# Patient Record
Sex: Female | Born: 1935
Health system: Southern US, Community
[De-identification: ages and names within clinical notes are randomized; demographics above are authoritative.]

## PROBLEM LIST (undated history)

## (undated) ENCOUNTER — Emergency Department (HOSPITAL_COMMUNITY): Payer: PRIVATE HEALTH INSURANCE

## (undated) DIAGNOSIS — N189 Chronic kidney disease, unspecified: Secondary | ICD-10-CM

## (undated) DIAGNOSIS — M1990 Unspecified osteoarthritis, unspecified site: Secondary | ICD-10-CM

## (undated) DIAGNOSIS — C801 Malignant (primary) neoplasm, unspecified: Secondary | ICD-10-CM

## (undated) DIAGNOSIS — E785 Hyperlipidemia, unspecified: Secondary | ICD-10-CM

## (undated) DIAGNOSIS — N200 Calculus of kidney: Secondary | ICD-10-CM

## (undated) DIAGNOSIS — K2970 Gastritis, unspecified, without bleeding: Secondary | ICD-10-CM

## (undated) DIAGNOSIS — I10 Essential (primary) hypertension: Secondary | ICD-10-CM

## (undated) DIAGNOSIS — M797 Fibromyalgia: Secondary | ICD-10-CM

## (undated) DIAGNOSIS — E039 Hypothyroidism, unspecified: Secondary | ICD-10-CM

## (undated) DIAGNOSIS — E119 Type 2 diabetes mellitus without complications: Secondary | ICD-10-CM

## (undated) DIAGNOSIS — K5790 Diverticulosis of intestine, part unspecified, without perforation or abscess without bleeding: Secondary | ICD-10-CM

## (undated) DIAGNOSIS — Z87442 Personal history of urinary calculi: Secondary | ICD-10-CM

## (undated) DIAGNOSIS — E063 Autoimmune thyroiditis: Secondary | ICD-10-CM

## (undated) DIAGNOSIS — B9681 Helicobacter pylori [H. pylori] as the cause of diseases classified elsewhere: Secondary | ICD-10-CM

## (undated) DIAGNOSIS — K219 Gastro-esophageal reflux disease without esophagitis: Secondary | ICD-10-CM

## (undated) DIAGNOSIS — R10813 Right lower quadrant abdominal tenderness: Secondary | ICD-10-CM

## (undated) HISTORY — DX: Hypothyroidism, unspecified: E03.9

## (undated) HISTORY — DX: Malignant (primary) neoplasm, unspecified: C80.1

## (undated) HISTORY — DX: Helicobacter pylori (H. pylori) as the cause of diseases classified elsewhere: B96.81

## (undated) HISTORY — DX: Calculus of kidney: N20.0

## (undated) HISTORY — DX: Right lower quadrant abdominal tenderness: R10.813

## (undated) HISTORY — PX: ABDOMINAL HYSTERECTOMY: SHX81

## (undated) HISTORY — DX: Helicobacter pylori (H. pylori) as the cause of diseases classified elsewhere: K29.70

## (undated) HISTORY — DX: Gastro-esophageal reflux disease without esophagitis: K21.9

## (undated) HISTORY — PX: TOTAL HIP ARTHROPLASTY: SHX124

## (undated) HISTORY — PX: ROBOTIC ASSITED PARTIAL NEPHRECTOMY: SHX6087

## (undated) HISTORY — PX: BLADDER SURGERY: SHX569

## (undated) HISTORY — DX: Essential (primary) hypertension: I10

## (undated) HISTORY — PX: CHOLECYSTECTOMY: SHX55

---

## 1992-07-22 HISTORY — PX: ESOPHAGOGASTRODUODENOSCOPY: SHX1529

## 2001-05-30 ENCOUNTER — Ambulatory Visit (HOSPITAL_COMMUNITY): Admission: RE | Admit: 2001-05-30 | Discharge: 2001-05-30 | Payer: Self-pay | Admitting: General Surgery

## 2001-05-30 ENCOUNTER — Encounter: Payer: Self-pay | Admitting: General Surgery

## 2001-06-26 ENCOUNTER — Ambulatory Visit (HOSPITAL_COMMUNITY): Admission: RE | Admit: 2001-06-26 | Discharge: 2001-06-26 | Payer: Self-pay | Admitting: Pulmonary Disease

## 2001-09-23 ENCOUNTER — Other Ambulatory Visit: Admission: RE | Admit: 2001-09-23 | Discharge: 2001-09-23 | Payer: Self-pay | Admitting: Obstetrics and Gynecology

## 2002-06-08 ENCOUNTER — Encounter: Payer: Self-pay | Admitting: General Surgery

## 2002-06-08 ENCOUNTER — Ambulatory Visit (HOSPITAL_COMMUNITY): Admission: RE | Admit: 2002-06-08 | Discharge: 2002-06-08 | Payer: Self-pay | Admitting: General Surgery

## 2003-03-22 ENCOUNTER — Encounter: Payer: Self-pay | Admitting: Internal Medicine

## 2003-03-22 ENCOUNTER — Ambulatory Visit (HOSPITAL_COMMUNITY): Admission: RE | Admit: 2003-03-22 | Discharge: 2003-03-22 | Payer: Self-pay | Admitting: Internal Medicine

## 2003-05-22 ENCOUNTER — Ambulatory Visit (HOSPITAL_COMMUNITY): Admission: RE | Admit: 2003-05-22 | Discharge: 2003-05-22 | Payer: Self-pay | Admitting: Internal Medicine

## 2003-05-22 HISTORY — PX: COLONOSCOPY: SHX174

## 2003-05-22 HISTORY — PX: ESOPHAGOGASTRODUODENOSCOPY: SHX1529

## 2003-06-11 ENCOUNTER — Encounter: Payer: Self-pay | Admitting: General Surgery

## 2003-06-11 ENCOUNTER — Ambulatory Visit (HOSPITAL_COMMUNITY): Admission: RE | Admit: 2003-06-11 | Discharge: 2003-06-11 | Payer: Self-pay | Admitting: General Surgery

## 2003-11-01 ENCOUNTER — Encounter: Payer: Self-pay | Admitting: Orthopedic Surgery

## 2004-06-03 ENCOUNTER — Ambulatory Visit (HOSPITAL_COMMUNITY): Admission: RE | Admit: 2004-06-03 | Discharge: 2004-06-03 | Payer: Self-pay | Admitting: Pulmonary Disease

## 2004-06-12 ENCOUNTER — Ambulatory Visit (HOSPITAL_COMMUNITY): Admission: RE | Admit: 2004-06-12 | Discharge: 2004-06-12 | Payer: Self-pay | Admitting: General Surgery

## 2005-03-19 ENCOUNTER — Ambulatory Visit: Payer: Self-pay | Admitting: Orthopedic Surgery

## 2005-04-07 ENCOUNTER — Ambulatory Visit: Payer: Self-pay | Admitting: Orthopedic Surgery

## 2005-04-27 ENCOUNTER — Ambulatory Visit: Payer: Self-pay | Admitting: Orthopedic Surgery

## 2005-06-15 ENCOUNTER — Ambulatory Visit (HOSPITAL_COMMUNITY): Admission: RE | Admit: 2005-06-15 | Discharge: 2005-06-15 | Payer: Self-pay | Admitting: Pulmonary Disease

## 2005-11-04 ENCOUNTER — Emergency Department (HOSPITAL_COMMUNITY): Admission: EM | Admit: 2005-11-04 | Discharge: 2005-11-04 | Payer: Self-pay | Admitting: Emergency Medicine

## 2006-02-05 ENCOUNTER — Ambulatory Visit (HOSPITAL_COMMUNITY): Admission: RE | Admit: 2006-02-05 | Discharge: 2006-02-05 | Payer: Self-pay | Admitting: Rheumatology

## 2006-02-23 ENCOUNTER — Encounter: Admission: RE | Admit: 2006-02-23 | Discharge: 2006-02-23 | Payer: Self-pay | Admitting: Rheumatology

## 2006-04-15 ENCOUNTER — Ambulatory Visit: Payer: Self-pay | Admitting: Orthopedic Surgery

## 2006-05-20 ENCOUNTER — Ambulatory Visit: Payer: Self-pay | Admitting: Orthopedic Surgery

## 2006-06-17 ENCOUNTER — Ambulatory Visit (HOSPITAL_COMMUNITY): Admission: RE | Admit: 2006-06-17 | Discharge: 2006-06-17 | Payer: Self-pay | Admitting: Pediatrics

## 2006-06-30 ENCOUNTER — Encounter: Admission: RE | Admit: 2006-06-30 | Discharge: 2006-06-30 | Payer: Self-pay | Admitting: Orthopedic Surgery

## 2006-07-20 ENCOUNTER — Encounter: Admission: RE | Admit: 2006-07-20 | Discharge: 2006-07-20 | Payer: Self-pay | Admitting: Orthopedic Surgery

## 2006-08-10 ENCOUNTER — Encounter: Admission: RE | Admit: 2006-08-10 | Discharge: 2006-08-10 | Payer: Self-pay | Admitting: Orthopedic Surgery

## 2006-09-02 ENCOUNTER — Ambulatory Visit: Payer: Self-pay | Admitting: Orthopedic Surgery

## 2007-03-21 ENCOUNTER — Ambulatory Visit: Payer: Self-pay | Admitting: Orthopedic Surgery

## 2007-04-07 ENCOUNTER — Ambulatory Visit: Payer: Self-pay | Admitting: Orthopedic Surgery

## 2007-05-24 ENCOUNTER — Ambulatory Visit: Payer: Self-pay | Admitting: Orthopedic Surgery

## 2007-06-20 ENCOUNTER — Ambulatory Visit (HOSPITAL_COMMUNITY): Admission: RE | Admit: 2007-06-20 | Discharge: 2007-06-20 | Payer: Self-pay | Admitting: General Surgery

## 2007-07-06 ENCOUNTER — Ambulatory Visit (HOSPITAL_COMMUNITY): Admission: RE | Admit: 2007-07-06 | Discharge: 2007-07-06 | Payer: Self-pay | Admitting: General Surgery

## 2007-07-20 ENCOUNTER — Ambulatory Visit: Payer: Self-pay | Admitting: Orthopedic Surgery

## 2007-11-14 ENCOUNTER — Other Ambulatory Visit: Admission: RE | Admit: 2007-11-14 | Discharge: 2007-11-14 | Payer: Self-pay | Admitting: Obstetrics and Gynecology

## 2007-12-08 ENCOUNTER — Ambulatory Visit: Payer: Self-pay | Admitting: Internal Medicine

## 2008-01-04 ENCOUNTER — Ambulatory Visit: Payer: Self-pay | Admitting: Internal Medicine

## 2008-01-16 ENCOUNTER — Ambulatory Visit: Payer: Self-pay | Admitting: Internal Medicine

## 2008-03-13 ENCOUNTER — Ambulatory Visit: Payer: Self-pay | Admitting: Internal Medicine

## 2008-06-21 ENCOUNTER — Ambulatory Visit (HOSPITAL_COMMUNITY): Admission: RE | Admit: 2008-06-21 | Discharge: 2008-06-21 | Payer: Self-pay | Admitting: General Surgery

## 2008-09-25 ENCOUNTER — Inpatient Hospital Stay: Admission: RE | Admit: 2008-09-25 | Discharge: 2008-10-03 | Payer: Self-pay | Admitting: Pulmonary Disease

## 2009-02-11 ENCOUNTER — Ambulatory Visit: Payer: Self-pay | Admitting: Orthopedic Surgery

## 2009-02-11 DIAGNOSIS — M797 Fibromyalgia: Secondary | ICD-10-CM | POA: Insufficient documentation

## 2009-02-11 DIAGNOSIS — M7150 Other bursitis, not elsewhere classified, unspecified site: Secondary | ICD-10-CM

## 2009-02-11 DIAGNOSIS — M79609 Pain in unspecified limb: Secondary | ICD-10-CM | POA: Insufficient documentation

## 2009-02-11 HISTORY — DX: Other bursitis, not elsewhere classified, unspecified site: M71.50

## 2009-02-12 ENCOUNTER — Encounter: Payer: Self-pay | Admitting: Gastroenterology

## 2009-02-20 ENCOUNTER — Encounter: Payer: Self-pay | Admitting: Orthopedic Surgery

## 2009-04-22 DIAGNOSIS — E049 Nontoxic goiter, unspecified: Secondary | ICD-10-CM | POA: Insufficient documentation

## 2009-04-22 DIAGNOSIS — I10 Essential (primary) hypertension: Secondary | ICD-10-CM | POA: Insufficient documentation

## 2009-04-22 DIAGNOSIS — IMO0001 Reserved for inherently not codable concepts without codable children: Secondary | ICD-10-CM

## 2009-04-22 DIAGNOSIS — K5730 Diverticulosis of large intestine without perforation or abscess without bleeding: Secondary | ICD-10-CM

## 2009-04-22 DIAGNOSIS — K5900 Constipation, unspecified: Secondary | ICD-10-CM

## 2009-04-22 DIAGNOSIS — M25559 Pain in unspecified hip: Secondary | ICD-10-CM

## 2009-04-22 DIAGNOSIS — Z8719 Personal history of other diseases of the digestive system: Secondary | ICD-10-CM

## 2009-04-22 DIAGNOSIS — E039 Hypothyroidism, unspecified: Secondary | ICD-10-CM

## 2009-04-22 DIAGNOSIS — R1013 Epigastric pain: Secondary | ICD-10-CM

## 2009-04-22 DIAGNOSIS — K219 Gastro-esophageal reflux disease without esophagitis: Secondary | ICD-10-CM

## 2009-04-22 HISTORY — DX: Reserved for inherently not codable concepts without codable children: IMO0001

## 2009-04-22 HISTORY — DX: Epigastric pain: R10.13

## 2009-04-22 HISTORY — DX: Pain in unspecified hip: M25.559

## 2009-04-22 HISTORY — DX: Constipation, unspecified: K59.00

## 2009-04-22 HISTORY — DX: Nontoxic goiter, unspecified: E04.9

## 2009-04-22 HISTORY — DX: Diverticulosis of large intestine without perforation or abscess without bleeding: K57.30

## 2009-04-22 HISTORY — DX: Hypothyroidism, unspecified: E03.9

## 2009-04-22 HISTORY — DX: Personal history of other diseases of the digestive system: Z87.19

## 2009-04-24 ENCOUNTER — Ambulatory Visit: Payer: Self-pay | Admitting: Gastroenterology

## 2009-06-24 ENCOUNTER — Ambulatory Visit (HOSPITAL_COMMUNITY): Admission: RE | Admit: 2009-06-24 | Discharge: 2009-06-24 | Payer: Self-pay | Admitting: General Surgery

## 2009-09-03 ENCOUNTER — Ambulatory Visit: Payer: Self-pay | Admitting: Orthopedic Surgery

## 2009-11-22 ENCOUNTER — Emergency Department (HOSPITAL_COMMUNITY): Admission: EM | Admit: 2009-11-22 | Discharge: 2009-11-22 | Payer: Self-pay | Admitting: Emergency Medicine

## 2010-06-02 ENCOUNTER — Ambulatory Visit (HOSPITAL_COMMUNITY): Admission: RE | Admit: 2010-06-02 | Discharge: 2010-06-02 | Payer: Self-pay | Admitting: Pulmonary Disease

## 2010-06-25 ENCOUNTER — Ambulatory Visit: Payer: Self-pay | Admitting: Orthopedic Surgery

## 2010-06-25 DIAGNOSIS — M6530 Trigger finger, unspecified finger: Secondary | ICD-10-CM | POA: Insufficient documentation

## 2010-06-25 HISTORY — DX: Trigger finger, unspecified finger: M65.30

## 2010-06-26 ENCOUNTER — Ambulatory Visit (HOSPITAL_COMMUNITY): Admission: RE | Admit: 2010-06-26 | Discharge: 2010-06-26 | Payer: Self-pay | Admitting: General Surgery

## 2010-06-26 ENCOUNTER — Telehealth: Payer: Self-pay | Admitting: Orthopedic Surgery

## 2010-06-27 ENCOUNTER — Telehealth: Payer: Self-pay | Admitting: Orthopedic Surgery

## 2010-08-23 ENCOUNTER — Emergency Department (HOSPITAL_COMMUNITY)
Admission: EM | Admit: 2010-08-23 | Discharge: 2010-08-23 | Payer: Self-pay | Source: Home / Self Care | Admitting: Emergency Medicine

## 2010-12-09 NOTE — Assessment & Plan Note (Signed)
Summary: LT HAND/FINGER PAIN/EVERCARE/CAF   Visit Type:  Follow-up Primary Provider:  Juanetta Gosling  CC:  trigger finger.  History of Present Illness: I saw Brenda Erickson for a followup visit.  Brenda Erickson is a 77 years old woman with the complaint of:  trigeer finger  left 3rd and 4th finger and right 3rd finger.  The patient has RIGHT long finger LEFT long finger and LEFT ring finger trigger phenomenon and we injected all 3 areas With the following technique  Verbal consent was obtained: The finger was prepped with ethyl chloride and injected with 1:1 injection of .25% sensorcaine, 1cc  and 40 mg of depomedrol, 1cc. There were no complications.   Allergies: 1)  ! Codeine   Impression & Recommendations:  Problem # 1:  TRIGGER FINGER DEFORMITY (ICD-727.03) Assessment Deteriorated  3 injection for the triggering  Orders: Injection, Tendon / Ligament ([address]109)  Patient Instructions: 1)  You have received an injection of cortisone Erickson. You may experience increased pain at the injection site. Apply ice pack to the area for 20 minutes every 2 hours and take 2 xtra strength tylenol every 8 hours. This increased pain will usually resolve in 24 hours. The injection will take effect in 3-10 days.  2)  Please schedule a follow-up appointment as needed.

## 2010-12-09 NOTE — Progress Notes (Signed)
Summary: patient requests Rx for pain  Phone Note Call from Patient   Caller: Patient Summary of Call: Patient called to request Hydrocodone, as said that her hand is really hurting and nothing is helping.  I reviewed instructions from visit yesterday re: icing, Tylenol.  Said even if "1 time" Rx and pharmacy is [facility] in [place].  Patient ph # [phone] Initial call taken by: Cammie Sickle,  June 26, 2010 9:36 AM  Follow-up for Phone Call        Tylenol with codeine #3  one tablet every 4 hours for pain dispensed #30   Follow-up by: Fuller Canada MD,  June 26, 2010 9:44 AM  Additional Follow-up for Phone Call Additional follow up Details #1::        called in Additional Follow-up by: Ether Griffins,  June 26, 2010 11:25 AM    New/Updated Medications: TYLENOL WITH CODEINE #3 300-30 MG TABS (ACETAMINOPHEN-CODEINE) 1 by mouth q 4 hrs as needed pain Prescriptions: TYLENOL WITH CODEINE #3 300-30 MG TABS (ACETAMINOPHEN-CODEINE) 1 by mouth q 4 hrs as needed pain  #30 x 0   Entered by:   Ether Griffins   Authorized by:   Fuller Canada MD   Signed by:   Ether Griffins on 06/26/2010   Method used:   Historical   RxID:   [id number]

## 2010-12-09 NOTE — Progress Notes (Signed)
Summary: allergic to codeine  Phone Note Other Incoming Call back at pharmacy   Summary of Call: advised that patient is allergic to codeine, any other suggestions for pain med Initial call taken by: Ether Griffins,  June 27, 2010 9:25 AM  Follow-up for Phone Call        she s going to have to take tylenol for pain  Follow-up by: Fuller Canada MD,  June 27, 2010 11:17 AM  Additional Follow-up for Phone Call Additional follow up Details #1::        ok advised pt Additional Follow-up by: Ether Griffins,  June 27, 2010 11:31 AM

## 2011-01-21 LAB — URINALYSIS, ROUTINE W REFLEX MICROSCOPIC
Bilirubin Urine: NEGATIVE
Urobilinogen, UA: 0.2 mg/dL (ref 0.0–1.0)
pH: 7 (ref 5.0–8.0)

## 2011-01-21 LAB — DIFFERENTIAL
Basophils Relative: 0 % (ref 0–1)
Eosinophils Absolute: 0.2 10*3/uL (ref 0.0–0.7)
Eosinophils Relative: 2 % (ref 0–5)
Lymphs Abs: 2.8 10*3/uL (ref 0.7–4.0)
Neutro Abs: 5.8 10*3/uL (ref 1.7–7.7)
Neutrophils Relative %: 62 % (ref 43–77)

## 2011-01-21 LAB — LIPASE, BLOOD: Lipase: 12 U/L (ref 11–59)

## 2011-01-21 LAB — COMPREHENSIVE METABOLIC PANEL
ALT: 51 U/L — ABNORMAL HIGH (ref 0–35)
Albumin: 4.3 g/dL (ref 3.5–5.2)
Creatinine, Ser: 0.84 mg/dL (ref 0.4–1.2)
GFR calc non Af Amer: >60 mL/min (ref >60)
Potassium: 3.6 mEq/L (ref 3.5–5.1)
Sodium: 135 mEq/L (ref 135–145)
Total Bilirubin: 0.6 mg/dL (ref 0.3–1.2)

## 2011-01-21 LAB — URINE MICROSCOPIC-ADD ON

## 2011-01-21 LAB — CBC
Hemoglobin: 15.8 g/dL — ABNORMAL HIGH (ref 12.0–15.0)
MCH: 30.5 pg (ref 26.0–34.0)
MCHC: 33 g/dL (ref 30.0–36.0)
RDW: 13.1 % (ref 11.5–15.5)

## 2011-01-25 LAB — URINE MICROSCOPIC-ADD ON

## 2011-01-25 LAB — URINE CULTURE
Colony Count: NO GROWTH
Culture: NO GROWTH

## 2011-01-25 LAB — URINALYSIS, ROUTINE W REFLEX MICROSCOPIC
Bilirubin Urine: NEGATIVE
Protein, ur: NEGATIVE mg/dL

## 2011-02-18 ENCOUNTER — Ambulatory Visit (INDEPENDENT_AMBULATORY_CARE_PROVIDER_SITE_OTHER): Payer: Medicare Other | Admitting: Orthopedic Surgery

## 2011-02-18 ENCOUNTER — Encounter: Payer: Self-pay | Admitting: Family Medicine

## 2011-02-18 DIAGNOSIS — M6530 Trigger finger, unspecified finger: Secondary | ICD-10-CM

## 2011-02-18 MED ORDER — METHYLPREDNISOLONE ACETATE 40 MG/ML IJ SUSP: 40.0000 mg | Freq: Once | INTRAMUSCULAR | Status: DC

## 2011-02-18 NOTE — Patient Instructions (Signed)
You have received a steroid shot. 15% of patients experience increased pain at the injection site with in the next 24 hours. This is best treated with ice and tylenol extra strength 2 tabs every 8 hours. If you are still having pain please call the office.    

## 2011-02-18 NOTE — Progress Notes (Signed)
Chief complaint triggering, LEFT hand, long and ring finger and RIGHT hand long finger.  77 year old female with history of triggering. Previously treated with injection did well. Complains of painful locking and catching for approximately 2-3 months.  Verbal consent was obtained to inject the LEFT long finger LEFT RIGHT finger and RIGHT long finger.  Trigger finger injection one-to-one mixture of lidocaine and Depo-Medrol. After sterile prep and spraying with ethyl chloride A1 pulley site was injected x3.

## 2011-03-24 NOTE — Assessment & Plan Note (Signed)
NAME:  Brenda Erickson, Brenda Erickson             CHART#:  [id number]   DATE:  03/13/2008                       DOB:  Apr 04, 1933   OFFICE FOLLOWUP.   HISTORY:  I last saw this nice lady on January 04, 2008.  She was  having some post-prandial epigastric pain, some reflux and constipation.  She had been started on Aciphex 20 mg early daily which has subsequently  been switched over to Omeprazole 20 mg daily for 3rd party payment  reasons.  She had been taking MiraLax 70 gm early at bedtime for  constipation.  Her last colonoscopy was in 2004.  She was found to have  left sided diverticula. H. Pylori was treated in the past.  Prior EGD  demonstrated reflux esophagitis.  Three myelin hemoccult since her last  visit came back negative.  She says since she switched to omeprazole and  continued using MiraLax her GI symptoms have RESOLVED.  She is having  normal bowel movements daily.  Her reflux symptoms have been pretty well  abolished.  She has not had clinically noted GI bleeding.  No early  satiety, odynophagia or dysphagia.  She tells me her right hip is bad.  She has seen doctors over at __________ making plans to get her hip  replaced in the near future.   CURRENT MEDICATIONS:  See updated list.   ALLERGIES:  CODEINE.   PHYSICAL EXAMINATION:  GENERAL: Today she appears at her baseline.  VITAL SIGNS:  Weight 145, height 5 feet 4 inches, temperature 97, blood  pressure 138/88, pulse 72.  SKIN:  Warm and dry.  CHEST:  Lungs are clear to auscultation.  CARDIAC:  Regular rate and rhythm without murmur, gallop or rub.  ABDOMEN:  Nondistended, positive bowel sounds, soft, nontender without  appreciable mass or megaly.   ASSESSMENT:  1. Gastroesophageal reflux disease symptoms.  2. History of reflux esophagitis well controlled on omeprazole.  3. Constipation.  4. History of diverticulosis, well controlled on MiraLax.   RECOMMENDATIONS:  1. Would continue this regimen.  2. I certainly gave the  okay to proceed on with getting her right hip      replaced by GI standpoint.  3. Unless something comes up, plan to see this nice lady back in 1      year and p.r.n.   ADDENDUM:  Screening colonoscopy due 2014.       Jonathon Bellows, M.D.  Electronically Signed     RMR/MEDQ  D:  03/13/2008  T:  03/13/2008  Job:  [id number]   cc:   Venora Maples

## 2011-03-24 NOTE — Assessment & Plan Note (Signed)
NAME:  ZENOLA, DEZARN             CHART#:  [id number]   DATE:  12/08/2007                       DOB:  June 07, 1933   PRIMARY CARE PHYSICIAN:  Dr. Juanetta Gosling.   CHIEF COMPLAINT:  Epigastric postprandial pain.   HISTORY OF PRESENT ILLNESS:  Ms. Senseney is a 77 year old female who  was referred through the courtesy of Dr. Rayna Sexton office for abdominal  pain. We have actually seen her previously back in 2004. She has history  of gastroesophageal reflux disease with minimal erosive esophagitis on  EGD. Back on May 22, 2003, she also had a colonoscopy at the same time  and was found to have left sided diverticula. She has been having post-  prandial epigastric pain, which she describes as a burning sensation,  usually occurs within minutes after eating. It can last several hours.  She describes the pain as 8 out of 10 but she rates the pain as 8 out of  10 at worst. She complains of symptoms several days a week. Denies any  heartburn. She has tried Prilosec in the past, as well as Nexium in the  past, which has not helped. She has been on Omeprazole 20 mg daily for  several years now. She also notes constipation. She is having hard bowel  movements and straining with bowel movement. She has had to take Milk of  Magnesia on a p.r.n. basis quite regularly. She goes 3 to 4 days without  a bowel movement. She has noticed some dark stools but denies any melena  or rectal bleeding. She tells me that her weight has gradually decreased  about 13 pounds over the course of 3 years, since she had a fall and  broken wrist. She is using over-the-counter antacids on a p.r.n. basis  along with her Omeprazole.   PAST MEDICAL/SURGICAL HISTORY:  Hypothyroidism, arthritis, fibromyalgia,  hypertension. She has history of H-pylori status post treatment. She has  history of erosive reflux esophagitis. See HPI. She had a right wrist  fracture in 2006. She has had a hysterectomy and cholecystectomy.   CURRENT  MEDICATIONS:  See the list from December 08, 2007.   ALLERGIES:  CODEINE.   FAMILY HISTORY:  Mother deceased at age 33 with history of liver caner.  Father deceased at age 64 with diabetes mellitus and hypertension. She  has multiple siblings, one with esophageal carcinoma.   SOCIAL HISTORY:  Ms. Bourbeau is separated. She has 7 children, alive.  She is disabled. She denies any tobacco or drug use. She rarely consumes  alcohol.   REVIEW OF SYSTEMS:  See HPI. Otherwise, negative.   PHYSICAL EXAMINATION:  VITAL SIGNS:  Weight 143 pounds. Height 64  inches. Temperature 98.1, blood pressure 124/88 and pulse 88.  GENERAL:  A well developed, well nourished female in no acute distress.  Alert, oriented,  pleasant and cooperative.  HEENT:  Sclerae clear. Nonicteric conjunctivae. Oropharynx  pink and  moist without any lesions.  NECK:  Supple. She does have some mandibular, freely mobile, small nodes  that are non-tender and not fixed.  CHEST/HEART:  Regular rate and rhythm. Normal S1 and S2. Without  murmurs, clicks, rubs, or gallops.  LUNGS:  Clear to auscultation bilaterally.  ABDOMEN:  Positive bowel sounds x4. No bruits auscultated. Soft,  nontender, and nondistended. Without palpable mass or  hepatosplenomegaly. No rebound, tenderness,  or guarding.  EXTREMITIES:  Without clubbing, cyanosis, or edema bilaterally.  SKIN:  Warm and dry without any rash or jaundice.   IMPRESSION:  Ms. Denherder is a 77 year old female with postprandial  dyspepsia. She also has chronic gastroesophageal reflux disease. Has  been on Omeprazole for years. Is having breakthrough  indigestion/dyspepsia. I have offered EGD versus a trial of changing her  PPI, given refractory symptoms. She has opted for the later. She also  has chronic constipation.   PLAN:  1. MiraLax 17 grams daily.  2. Check CBC and LFT's.  3. GERD and constipation literature given for review.  4. Stop Omeprazole and start Aciphex 20  mg daily, #31 with 5 refills.      I have given her 2 bottles of samples today.  5. Will bring her back in 1 month to see Dr. Jena Gauss. If there is no      improvement, I suspect she will need EGD for further evaluation.  6. She needs a colonoscopy in 2014, if she remains in good health.   Thank you Dr. Emelda Fear for allowing [place] to participate in the care of Ms.  Nipper.       Lorenza Burton, N.P.  Electronically Signed     R. Roetta Sessions, M.D.  Electronically Signed    KJ/MEDQ  D:  12/08/2007  T:  12/08/2007  Job:  [id number]   cc:   Tilda Burrow, M.D.  Dr. Juanetta Gosling

## 2011-03-24 NOTE — Assessment & Plan Note (Signed)
NAME:  [name], Erickson             CHART#:  [id number]   DATE:  01/04/2008                       DOB:  [birth year]   OFFICE FOLLOWUP:  Postprandial epigastric pain.  Constipation.  Seen  December 08, 2007 for the above-mentioned symptoms.  She had been on  omeprazole having some breakthrough reflux symptoms and postprandial  reflux.  We stopped the omeprazole and started Aciphex 20 mg orally  daily.  It has been associated with marked improvement in the above-  mentioned symptoms.  CBC and LFTs came back normal.  She is status post  cholecystectomy.  She is also constipated and started on some MiraLax 70  mg orally at bedtime.  This has been of tremendous help with  constipation.  She has not had any rectal bleeding, abdominal pain,  postprandial has fallen off dramatically.  When it does occur it only  lasts for a couple of minutes.  Prior EGD demonstrated reflux  esophagitis.  Colonoscopy demonstrated left-sided diverticula back in  2004.  There is no family history of GI malignancy.  She is status post  cholecystectomy.  She is status post H. pylori treatment in the past.   CURRENT MEDICATIONS:  See updated list.   ALLERGIES:  CODEINE.   PHYSICAL EXAMINATION:  Exam today, she looks well.  Weight is up 1  pound.  Height 5 feet 4 inches.  Temp 98.3.  BP 130/80, pulse 72.  ABDOMEN:  Flat, soft, nontender without appreciable mass or megaly.   ASSESSMENT:  Postprandial symptoms, for the most part following around  the gastroesophageal reflux disease.  These symptoms have been markedly  improved with the modification in her acid suppression regimen as  outlined above.  Constipation also much improved with MiraLax.  She is  slated to have a repeat screening colonoscopy in 2014.   RECOMMENDATIONS:  1. Continue Aciphex 20 mg orally daily.  2. Will send her home with 3 hemoccult cards for completion's sake.  3. Will plan to see this nice lady back in 2 months.  If she has any  interim problems or worsening of her symptoms, she is to let us      know and plan to make change.       Jonathon Bellows, M.D.  Electronically Signed     RMR/MEDQ  D:  01/04/2008  T:  01/05/2008  Job:  [id number]   cc:   Ramon Dredge L. Juanetta Gosling, M.D.  Cherylin Mylar, MD

## 2011-03-27 NOTE — Op Note (Signed)
NAME:  Brenda Erickson, Brenda Erickson                      ACCOUNT NO.:  [id number]   MEDICAL RECORD NO.:  [id number]                   PATIENT TYPE:  AMB   LOCATION:  DAY                                  FACILITY:  [facility]   PHYSICIAN:  R. Roetta Sessions, M.D.              DATE OF BIRTH:  1933-06-28   DATE OF PROCEDURE:  05/22/2003  DATE OF DISCHARGE:                                 OPERATIVE REPORT   PROCEDURE:  Diagnostic esophagogastroduodenoscopy followed by screening  colonoscopy.   INDICATIONS FOR PROCEDURE:  The patient is a 77 year old lady with  refractory gastroesophageal reflux disease symptoms.  She had reflux to the  level of the thoracic inlet on recent upper GI series in spite of taking  Prilosec 20 mg orally daily.  Last EGD was in 1993.  EGD is now being done  to further evaluate her longstanding refractory reflux symptoms.  Colonoscopy is now being done as a screening maneuver.  This approach has  been discussed with the patient previously.  The potential risks, benefits,  and alternatives have been reviewed and questions answered.  She is a  agreeable.  Please see my dictated H&P for more information.   PROCEDURE:  O2 saturation, blood pressure, pulses, and respirations were  monitored throughout the entire of both procedures.  Conscious sedation was  with Versed 5 mg IV, Demerol 75 mg IV in divided doses.  The instrument used  was the Olympus video chip adult gastroscope and colonoscope.   FINDINGS:  EGD:  Examination of the tubular esophagus revealed four 2-mm  distal esophageal erosions.  There was no Barrett's esophagus.  No other  mucosal abnormality.  The EG junction was easily traversed.   Stomach:  The gastric cavity was empty and insufflated well with air.  Thorough examination of the gastric mucosa including retroflex view in the  proximal stomach and esophagogastric junction demonstrated no abnormalities.  The pylorus was patent and easily traversed.   Duodenum:   The bulb and second portion appeared normal.   THERAPEUTIC/DIAGNOSTIC MANEUVERS PERFORMED:  None.   The patient tolerated the procedure well and was prepared for colonoscopy.    Colonoscopy:  Digital rectal examination revealed no abnormalities.   ENDOSCOPIC FINDINGS:  The prep was good.   Rectum:  Examination of the rectal mucosa including retroflex view of the  anal verge revealed no abnormalities.   Colon:  The colonic mucosa was surveyed from the rectosigmoid junction  through the left, transverse, right colon to the area of the appendiceal  orifice, ileocecal valve, and cecum.  These structures were well-seen and  photographed for the record.  The patient scattered narrow-mouth left-sided  diverticula.  The remainder of the colonic mucosa to the cecum appeared  normal.  The terminal ileum was intubated to 5 cm.  This segment also  appeared normal.  From this level, the scope was slowly withdrawn.  All  previously mentioned mucosal surfaces were  again seen.  No other  abnormalities were observed.  The patient tolerated the procedure well and  was reactive in endoscopy.   ESOPHAGOGASTRODUODENOSCOPY IMPRESSION:  Esophagogastroduodenoscopy:  Tiny  distal esophageal erosions consistent with mild erosive reflux esophagitis.  Otherwise, normal upper gastrointestinal tract through the second part of  the duodenum.   COLONOSCOPY IMPRESSION:  1. Normal rectum.  2. Left-sided diverticula.  The remainder of the colonic mucosa and terminal     ileum appeared normal.   RECOMMENDATIONS:  1. Diverticulosis literature provided to the patient.  2. Repeat colonoscopy in 10 years.  3. Stop Prilosec and begin Prevacid 30 mg orally daily before breakfast (the     patient can go by my office for free samples).  4. Followup appointment with me in one month.                                               Jonathon Bellows, M.D.    RMR/MEDQ  D:  05/22/2003  T:  05/22/2003  Job:  [id number]    cc:   Ramon Dredge L. Juanetta Gosling, M.D.  [address]  [place]  [place] [address]  Fax: [phone]

## 2011-03-27 NOTE — H&P (Signed)
NAME:  Brenda Erickson, Brenda Erickson NO.:  [id number]   MEDICAL RECORD NO.:  [id number]                   PATIENT TYPE:   LOCATION:                                       FACILITY:   PHYSICIAN:  R. Roetta Sessions, M.D.              DATE OF BIRTH:  02-20-1933   DATE OF ADMISSION:  DATE OF DISCHARGE:                                HISTORY & PHYSICAL   CHIEF COMPLAINT:  Ten plus year history of gastroesophageal reflux disease,  need for colorectal cancer screening.   [name] is a pleasant 77 year old lady who has longstanding  typical gastroesophageal reflux disease symptoms not totally __________ by  Prilosec 20 mg orally daily.  She still describes taking Mylanta or Maalox  once daily.  She underwent an upper GI series recently (declined EGD  initially) which revealed a 3 cm hiatal hernia and marked gastroesophageal  reflux disease to the thoracic inlet.  H. pylori serology is positive but  she did take triple drug therapy.  It was recommended she previously have a  screening colonoscopy (which she has never had before and has not yet  followed through).  No odynophagia, no dysphagia, no early satiety, no  nausea, vomiting, melena, rectal bleeding.   No family history of colorectal neoplasia, although, mother died of liver  cancer and brother died of esophageal cancer.   CURRENT MEDICATIONS:  1. Flexeril 10 mg t.i.d.  2. Carafate 750 mg, two tablets b.i.d.  3. Prilosec 20 mg daily.  4. Clarinex 5 mg daily.  5. Lisinopril/HCTZ daily.  6. Levoxyl 75 mcg daily.  7. ASA daily.  8. __________ daily.   ALLERGIES:  CODEINE.   PAST MEDICAL HISTORY:  1. Hormone replacement therapy.  2. Hypertension.  3. History of thyroid goiter - followed by Dr. Patrecia Pace.  4. Fibromyalgia.  5. Cholecystectomy.  6. Partial hysterectomy.   SOCIAL HISTORY:  Patient is separated, has seven children.  She is retired.  She has never been a tobacco user or smoker.   REVIEW OF SYSTEMS:  As in history of present illness.   EXAM TODAY:  GENERAL:  [name] is a pleasant 77 year old lady resting  comfortably.  VITAL SIGNS:  Weight 164, BP 124/80, pulse 82.  SKIN:  Warm and dry.  HEENT:  No scleral icterus.  Conjunctivae are pink.  JVD is not prominent.  CHEST:  Lungs are clear to auscultation.  HEART:  Regular rate and rhythm without murmur, gallop or rub.  ABDOMEN:  Nondistended.  Positive bowel sounds.  Soft, nontender without  appreciable mass, organomegaly.  EXTREMITIES:  No edema.  RECTAL:  Deferred till the time of colonoscopy.   IMPRESSION:  [name] is a pleasant 77 year old lady who has a  history of Helicobacter pylori based on serology who is completing triple  drug therapy.  She has longstanding gastroesophageal reflux disease  symptoms, a normal esophagogastroduodenoscopy 2003 but significant reflux  on  recent upper gastrointestinal series and she has break through symptoms now  on standardized PTI therapy.  I really feel she needs to have her upper  gastrointestinal tract looked at once again via esophagogastroduodenoscopy.  She also needs colonoscopy.  I have discussed this approach with Ms.  Brenda Erickson.  Potential risks, benefits and alternative have been reviewed,  questions answered.  She is agreeable.  We will plan to perform both  esophagogastroduodenoscopy and colonoscopy in the near future at [facility].  Further recommendations to follow.                                                Jonathon Bellows, M.D.    RMR/MEDQ  D:  05/09/2003  T:  05/09/2003  Job:  [id number]   cc:   Mary Sella. Orlene Erm, M.D.  [address]  [place]  [place] [address]  Fax: [phone]

## 2011-05-28 ENCOUNTER — Other Ambulatory Visit (HOSPITAL_COMMUNITY): Payer: Self-pay | Admitting: General Surgery

## 2011-05-28 DIAGNOSIS — Z139 Encounter for screening, unspecified: Secondary | ICD-10-CM

## 2011-06-30 ENCOUNTER — Ambulatory Visit (HOSPITAL_COMMUNITY)
Admission: RE | Admit: 2011-06-30 | Discharge: 2011-06-30 | Disposition: A | Discharge status: Home / Self Care | Payer: Medicare Other | Source: Ambulatory Visit | Attending: General Surgery | Admitting: General Surgery

## 2011-06-30 DIAGNOSIS — Z139 Encounter for screening, unspecified: Secondary | ICD-10-CM

## 2011-06-30 DIAGNOSIS — Z1231 Encounter for screening mammogram for malignant neoplasm of breast: Secondary | ICD-10-CM | POA: Insufficient documentation

## 2012-05-24 ENCOUNTER — Other Ambulatory Visit (HOSPITAL_COMMUNITY): Payer: Self-pay | Admitting: General Surgery

## 2012-05-24 DIAGNOSIS — Z139 Encounter for screening, unspecified: Secondary | ICD-10-CM

## 2012-05-25 ENCOUNTER — Encounter: Payer: Self-pay | Admitting: Orthopedic Surgery

## 2012-05-25 ENCOUNTER — Ambulatory Visit (INDEPENDENT_AMBULATORY_CARE_PROVIDER_SITE_OTHER): Payer: Medicare Other | Admitting: Orthopedic Surgery

## 2012-05-25 VITALS — BP 126/80 | Ht 64.0 in | Wt 145.0 lb

## 2012-05-25 DIAGNOSIS — M6530 Trigger finger, unspecified finger: Secondary | ICD-10-CM

## 2012-05-25 HISTORY — DX: Trigger finger, unspecified finger: M65.30

## 2012-05-25 NOTE — Progress Notes (Signed)
Patient ID: Brenda Erickson, female   DOB: 1933-02-12, 78 y.o.   MRN: [id number] Chief Complaint  Patient presents with  . Hand Pain    trigger finger, left third finger    Hand Pain  The incident occurred more than 1 week ago (3 weeks). There was no injury mechanism. Pain location: LEFT long finger. The quality of the pain is described as aching (catching and locking). The pain does not radiate. The pain is moderate. The pain has been worsening since the incident.   Review of systems is negative.  Examination inspection tenderness over the A1 pulley. Range of motion restricted flexion. Stability normal. Flexion. Strength, normal. Skin normal. Color of the digit and capillary refill normal.  Diagnosis trigger, LEFT long finger.  Plan injection.  Followup if not improved  Procedure note trigger finger injection  Diagnosis trigger finger Postop diagnosis trigger finger Procedure injection of trigger finger Finger injectedLEFT long finger  Details of procedure: After verbal consent and timeout to confirm site the LEFT long finger was injected with 1 cc of 40 mg of Depo-Medrol and 1 cc of 1% lidocaine  The procedure was tolerated well without complication

## 2012-05-25 NOTE — Patient Instructions (Addendum)
You have received a steroid shot. 15% of patients experience increased pain at the injection site with in the next 24 hours. This is best treated with ice and tylenol extra strength 2 tabs every 8 hours. If you are still having pain please call the office.  Trigger Finger Trigger finger (digital tendinitis and stenosing tenosynovitis) is a common disorder that causes an often painful catching of the fingers or thumb. It occurs as a clicking, snapping or locking of a finger in the palm of the hand. The reason for this is that there is a problem with the tendons which flex the fingers sliding smoothly through their sheaths. The cause of this may be inflammation of the tendon and sheath, or from a thickening or nodule in the tendon. The condition may occur in any finger or a couple fingers at the same time. The cause may be overuse while doing the same activity over and over again with your hands.   Tendons are the tough cords that connect the muscles to bones. Muscles and tendons are part of the system which allows your body to move. When muscles contract in the forearm on the palm side, they pull the tendons toward the elbow and cause the fingers and thumb to bend (flex) toward the palm. These are the flexor tendons. The tendons slide through a slippery smooth membrane (synovium) which is called the tendon sheath. The sheaths have areas of tough fibrous tissues surrounding them which hold the tendons close to the bone. These are called pulleys because they work like a pulley. The first pulley is in the palm of the hand near the crease which runs across your palm. If the area of the tendon thickening is near the pulley, the tendon cannot slide smoothly through the pulley and this causes the trigger finger. The finger may lock with the finger curled or suddenly straighten out with a snap. This is more common in patients with rheumatoid arthritis and diabetes. Left untreated, the condition may get worse to the point  where the finger becomes locked in flexion, like making a fist, or less commonly locked with the finger straightened out. DIAGNOSIS   Your caregiver will easily make this diagnosis on examination. TREATMENT    Splinting for 6 to 8 weeks of time may be helpful. Use the splints as your caregiver suggests.   Heat used for twenty minutes at least four times a day followed by ice packs for twenty minutes unless directed otherwise by your caregiver may be helpful. If you find either heat or cold seems to be making the problem worse, quit using them and ask your caregiver for directions.   Cortisone injections along with splinting may speed up recovery. Several injections may be required. Cortisone may give relief after one injection.   Only take over-the-counter or prescription medicines for pain, discomfort, or fever as directed by your caregiver.   Surgery is another treatment that may be used if conservative treatments using injection and splinting does not work. Surgery can be minor without incisions (a cut does not have to be made) and can be done with a needle through the skin. No stitches are needed and most patients may return to work the same day.   Other surgical choices involve an open procedure where the surgeon opens the hand through a small incision (cut) and cuts the pulley so the tendon can again slide smoothly. Your hand will still work fine. This small operation requires stitches and the recovery will be  a little longer and the incisions will need to be protected until completely healed. You may have to limit your activities for up to 6 months.   Occupational or hand therapy may be required if there is stiffness remaining in the finger.  RISKS AND COMPLICATIONS Complications are uncommon but some problems that may occur are:  Recurrence of the trigger finger. This does not mean that the surgery was not well done. It simply means that you may have formed scar tissue following surgery  that causes the problem to reoccur.   Infection which could ruin the results of the surgery and can result in a finger which is frozen and can not move normally.   Nerve injury is possible which could result in permanent numbness of one or more fingers.

## 2012-07-04 ENCOUNTER — Ambulatory Visit (HOSPITAL_COMMUNITY)
Admission: RE | Admit: 2012-07-04 | Discharge: 2012-07-04 | Disposition: A | Discharge status: Home / Self Care | Payer: PRIVATE HEALTH INSURANCE | Source: Ambulatory Visit | Attending: General Surgery | Admitting: General Surgery

## 2012-07-04 DIAGNOSIS — Z1231 Encounter for screening mammogram for malignant neoplasm of breast: Secondary | ICD-10-CM | POA: Insufficient documentation

## 2012-07-04 DIAGNOSIS — Z139 Encounter for screening, unspecified: Secondary | ICD-10-CM

## 2012-07-05 ENCOUNTER — Other Ambulatory Visit (HOSPITAL_COMMUNITY): Payer: Self-pay | Admitting: Podiatry

## 2012-07-05 DIAGNOSIS — M79671 Pain in right foot: Secondary | ICD-10-CM

## 2012-07-05 DIAGNOSIS — M79669 Pain in unspecified lower leg: Secondary | ICD-10-CM

## 2012-07-05 DIAGNOSIS — M79672 Pain in left foot: Secondary | ICD-10-CM

## 2012-07-05 DIAGNOSIS — I999 Unspecified disorder of circulatory system: Secondary | ICD-10-CM

## 2012-07-07 ENCOUNTER — Ambulatory Visit (HOSPITAL_COMMUNITY)
Admission: RE | Admit: 2012-07-07 | Discharge: 2012-07-07 | Disposition: A | Discharge status: Home / Self Care | Payer: PRIVATE HEALTH INSURANCE | Source: Ambulatory Visit | Attending: Podiatry | Admitting: Podiatry

## 2012-07-07 DIAGNOSIS — I999 Unspecified disorder of circulatory system: Secondary | ICD-10-CM

## 2012-07-07 DIAGNOSIS — M79669 Pain in unspecified lower leg: Secondary | ICD-10-CM

## 2012-07-07 DIAGNOSIS — I739 Peripheral vascular disease, unspecified: Secondary | ICD-10-CM | POA: Insufficient documentation

## 2012-07-07 DIAGNOSIS — M79609 Pain in unspecified limb: Secondary | ICD-10-CM | POA: Insufficient documentation

## 2012-07-07 DIAGNOSIS — M79672 Pain in left foot: Secondary | ICD-10-CM

## 2012-07-25 ENCOUNTER — Emergency Department (HOSPITAL_COMMUNITY)
Admission: EM | Admit: 2012-07-25 | Discharge: 2012-07-25 | Disposition: A | Discharge status: Home / Self Care | Payer: PRIVATE HEALTH INSURANCE | Attending: Emergency Medicine | Admitting: Emergency Medicine

## 2012-07-25 ENCOUNTER — Encounter (HOSPITAL_COMMUNITY): Payer: Self-pay | Admitting: *Deleted

## 2012-07-25 DIAGNOSIS — M129 Arthropathy, unspecified: Secondary | ICD-10-CM | POA: Insufficient documentation

## 2012-07-25 DIAGNOSIS — E039 Hypothyroidism, unspecified: Secondary | ICD-10-CM | POA: Insufficient documentation

## 2012-07-25 DIAGNOSIS — K219 Gastro-esophageal reflux disease without esophagitis: Secondary | ICD-10-CM | POA: Insufficient documentation

## 2012-07-25 DIAGNOSIS — I10 Essential (primary) hypertension: Secondary | ICD-10-CM | POA: Insufficient documentation

## 2012-07-25 DIAGNOSIS — N390 Urinary tract infection, site not specified: Secondary | ICD-10-CM

## 2012-07-25 DIAGNOSIS — IMO0001 Reserved for inherently not codable concepts without codable children: Secondary | ICD-10-CM | POA: Insufficient documentation

## 2012-07-25 HISTORY — DX: Autoimmune thyroiditis: E06.3

## 2012-07-25 HISTORY — DX: Unspecified osteoarthritis, unspecified site: M19.90

## 2012-07-25 HISTORY — DX: Fibromyalgia: M79.7

## 2012-07-25 LAB — URINALYSIS, ROUTINE W REFLEX MICROSCOPIC
Ketones, ur: NEGATIVE mg/dL
Nitrite: POSITIVE — AB
Protein, ur: >300 mg/dL — AB
Urobilinogen, UA: 0.2 mg/dL (ref 0.0–1.0)
pH: 6.5 (ref 5.0–8.0)

## 2012-07-25 LAB — URINE MICROSCOPIC-ADD ON

## 2012-07-25 MED ORDER — CIPROFLOXACIN HCL 250 MG PO TABS
500.0000 mg | ORAL_TABLET | Freq: Once | ORAL | Status: AC
  Administered 2012-07-25: 500 mg via ORAL
  Filled 2012-07-25: qty 2

## 2012-07-25 MED ORDER — CIPROFLOXACIN HCL 500 MG PO TABS: 500.0000 mg | ORAL_TABLET | Freq: Two times a day (BID) | ORAL | Status: DC

## 2012-07-25 MED ORDER — TRAMADOL HCL 50 MG PO TABS: 50.0000 mg | ORAL_TABLET | Freq: Four times a day (QID) | ORAL | Status: DC | PRN

## 2012-07-25 NOTE — ED Notes (Signed)
md to see pt

## 2012-07-25 NOTE — ED Notes (Signed)
Low abd pain, hematuria,NO  NVD

## 2012-07-25 NOTE — ED Provider Notes (Cosign Needed)
History     CSN: [id number]  Arrival date & time 07/25/12  1435   First MD Initiated Contact with Patient 07/25/12 1842      Chief Complaint  Patient presents with  . Hematuria    (Consider location/radiation/quality/duration/timing/severity/associated sxs/prior treatment) Patient is a 78 y.o. female presenting with hematuria. The history is provided by the patient (pt complains of dysuria).  Hematuria This is a new problem. The current episode started yesterday. The problem is unchanged. She describes the hematuria as gross hematuria. The hematuria occurs during the initial portion of her urinary stream. She reports no clotting in her urine stream. Her pain is at a severity of 3/10. The pain is mild. She describes her urine color as light pink. Irritative symptoms include frequency. Obstructive symptoms do not include dribbling. Associated symptoms include abdominal pain. There is no history of BPH.    Past Medical History  Diagnosis Date  . Esophageal reflux   . HTN (hypertension)   . Arthritis   . Fibromyalgia   . Hypothyroid   . Hashimoto's disease     Past Surgical History  Procedure Date  . Cholecystectomy   . Abdominal hysterectomy   . Total hip arthroplasty   . Colonoscopy 2004  . Bladder surgery     Family History  Problem Relation Age of Onset  . Diabetes    . Hypertension    . Cancer      lung    History  Substance Use Topics  . Smoking status: Never Smoker   . Smokeless tobacco: Not on file  . Alcohol Use: No    OB History    Grav Para Term Preterm Abortions TAB SAB Ect Mult Living                  Review of Systems  Constitutional: Negative for fatigue.  HENT: Negative for congestion, sinus pressure and ear discharge.   Eyes: Negative for discharge.  Respiratory: Negative for cough.   Cardiovascular: Negative for chest pain.  Gastrointestinal: Positive for abdominal pain. Negative for diarrhea.  Genitourinary: Positive for frequency and  hematuria.  Musculoskeletal: Negative for back pain.  Skin: Negative for rash.  Neurological: Negative for seizures and headaches.  Hematological: Negative.   Psychiatric/Behavioral: Negative for hallucinations.    Allergies  Codeine  Home Medications   Current Outpatient Rx  Name Route Sig Dispense Refill  . AMLODIPINE BESYLATE 10 MG PO TABS Oral Take 10 mg by mouth daily.    Marland Kitchen CIPROFLOXACIN HCL 500 MG PO TABS Oral Take 1 tablet (500 mg total) by mouth 2 (two) times daily. 14 tablet 0  . CLONIDINE HCL 0.1 MG PO TABS Oral Take 0.1 mg by mouth at bedtime.    . CYCLOBENZAPRINE HCL 10 MG PO TABS Oral Take 10 mg by mouth 3 (three) times daily as needed.    . FUROSEMIDE 40 MG PO TABS Oral Take 40 mg by mouth daily.    . IBANDRONATE SODIUM 150 MG PO TABS Oral Take 150 mg by mouth every 30 (thirty) days. Take in the morning with a full glass of water, on an empty stomach, and do not take anything else by mouth or lie down for the next 30 min.    Marland Kitchen LEVOTHYROXINE SODIUM 100 MCG PO TABS Oral Take 100 mcg by mouth daily.    Marland Kitchen METOPROLOL SUCCINATE ER 100 MG PO TB24 Oral Take 100 mg by mouth daily. Take with or immediately following a meal.    .  OMEPRAZOLE 20 MG PO CPDR Oral Take 20 mg by mouth 2 (two) times daily.    Marland Kitchen PREGABALIN 50 MG PO CAPS Oral Take 50 mg by mouth 2 (two) times daily.    . SUCRALFATE 1 G PO TABS Oral Take 1 g by mouth 4 (four) times daily.    . TRAMADOL HCL 50 MG PO TABS Oral Take 1 tablet (50 mg total) by mouth every 6 (six) hours as needed for pain. 15 tablet 0    BP 148/85  Pulse 86  Temp 98.4 F (36.9 C) (Oral)  Resp 20  Ht 5\' 4"  (1.626 m)  Wt 149 lb (67.586 kg)  BMI 25.58 kg/m2  SpO2 97%  Physical Exam  Constitutional: She is oriented to person, place, and time. She appears well-developed.  HENT:  Head: Normocephalic and atraumatic.  Eyes: Conjunctivae normal and EOM are normal. No scleral icterus.  Neck: Neck supple. No thyromegaly present.  Cardiovascular:  Normal rate and regular rhythm.  Exam reveals no gallop and no friction rub.   No murmur heard. Pulmonary/Chest: No stridor. She has no wheezes. She has no rales. She exhibits no tenderness.  Abdominal: She exhibits no distension. There is tenderness. There is no rebound.  Musculoskeletal: Normal range of motion. She exhibits no edema.  Lymphadenopathy:    She has no cervical adenopathy.  Neurological: She is oriented to person, place, and time. Coordination normal.  Skin: No rash noted. No erythema.  Psychiatric: She has a normal mood and affect. Her behavior is normal.    ED Course  Procedures (including critical care time)  Labs Reviewed  URINALYSIS, ROUTINE W REFLEX MICROSCOPIC - Abnormal; Notable for the following:    Color, Urine RED (*)  BIOCHEMICALS MAY BE AFFECTED BY COLOR   APPearance HAZY (*)     Hgb urine dipstick LARGE (*)     Bilirubin Urine SMALL (*)     Protein, ur >300 (*)     Nitrite POSITIVE (*)     Leukocytes, UA MODERATE (*)     All other components within normal limits  URINE MICROSCOPIC-ADD ON - Abnormal; Notable for the following:    Bacteria, UA FEW (*)     All other components within normal limits  URINE CULTURE   No results found.   1. UTI (lower urinary tract infection)       MDM          Benny Lennert, MD 07/25/12 1909

## 2012-07-26 LAB — URINE CULTURE: Colony Count: 25000

## 2012-09-20 ENCOUNTER — Encounter: Payer: Self-pay | Admitting: Internal Medicine

## 2012-09-21 ENCOUNTER — Encounter: Payer: Self-pay | Admitting: Gastroenterology

## 2012-09-21 ENCOUNTER — Ambulatory Visit (INDEPENDENT_AMBULATORY_CARE_PROVIDER_SITE_OTHER): Payer: PRIVATE HEALTH INSURANCE | Admitting: Gastroenterology

## 2012-09-21 VITALS — BP 130/78 | HR 67 | Temp 97.4°F | Ht 64.0 in | Wt 150.4 lb

## 2012-09-21 DIAGNOSIS — R194 Change in bowel habit: Secondary | ICD-10-CM

## 2012-09-21 DIAGNOSIS — R198 Other specified symptoms and signs involving the digestive system and abdomen: Secondary | ICD-10-CM

## 2012-09-21 HISTORY — DX: Change in bowel habit: R19.4

## 2012-09-21 MED ORDER — PEG 3350-KCL-NA BICARB-NACL 420 G PO SOLR: 4000.0000 mL | ORAL | Status: DC

## 2012-09-21 NOTE — Patient Instructions (Addendum)
Start taking supplemental fiber daily (Metamucil). We have provided samples.  You may take Miralax daily as needed for a bowel movement.  We have scheduled you for a colonoscopy with Dr. Jena Gauss in the near future. Further recommendations to follow.

## 2012-09-21 NOTE — Progress Notes (Signed)
Primary Care Physician:  Fredirick Maudlin, MD Primary Gastroenterologist:  Dr. Jena Gauss   Chief Complaint  Patient presents with  . Constipation    HPI:   [name] presents today at the request of Dr. Juanetta Gosling due to recent change in bowel habits. Last colonoscopy in 2004 by Dr. Jena Gauss with left-sided diverticula. Notes new onset constipation. States intermittent constipation, feels she may be "holding it". Feels like incomplete evacuation. No rectal bleeding. States +wt loss of a few lbs then "gained it back". States appetite waxes and wanes, which is her baseline. No abdominal pain. No N/V. No reflux, heartburn. States sometimes doesn't chew food enough. States has thyroid problem. Loves green veggies. No new medications.   Past Medical History  Diagnosis Date  . Esophageal reflux   . HTN (hypertension)   . Arthritis   . Fibromyalgia   . Hypothyroid   . Hashimoto's disease   . Helicobacter pylori gastritis     in remote past, s/p treatment     Past Surgical History  Procedure Date  . Cholecystectomy   . Abdominal hysterectomy     tumor removal; benign  . Total hip arthroplasty   . Colonoscopy 05/22/2003    NWG:[id number] rectum/ Left-sided diverticula.  The remainder of the colonic mucosa and terminal  ileum appeared normal  . Bladder surgery   . Esophagogastroduodenoscopy 05/22/2003    RMR: Tiny distal esophageal erosions consistent with mild erosive reflux esophagitisOtherwise, normal upper gastrointestinal tract through the second part of   the duodenum  . Esophagogastroduodenoscopy 07/22/1992    RMR: Normal EGD    Current Outpatient Prescriptions  Medication Sig Dispense Refill  . amLODipine (NORVASC) 10 MG tablet Take 5 mg by mouth daily.       . calcium-vitamin D (OSCAL WITH D) 250-125 MG-UNIT per tablet Take 1 tablet by mouth daily.      . ciprofloxacin (CIPRO) 500 MG tablet Take 1 tablet (500 mg total) by mouth 2 (two) times daily.  14 tablet  0  . cloNIDine  (CATAPRES) 0.1 MG tablet Take 0.1 mg by mouth at bedtime.      . cyclobenzaprine (FLEXERIL) 10 MG tablet Take 10 mg by mouth 3 (three) times daily as needed.      . fish oil-omega-3 fatty acids 1000 MG capsule Take 2 g by mouth daily.      . furosemide (LASIX) 40 MG tablet Take 40 mg by mouth daily.      Marland Kitchen ibandronate (BONIVA) 150 MG tablet Take 150 mg by mouth every 30 (thirty) days. Take in the morning with a full glass of water, on an empty stomach, and do not take anything else by mouth or lie down for the next 30 min.      Marland Kitchen levothyroxine (SYNTHROID, LEVOTHROID) 100 MCG tablet Take 100 mcg by mouth daily.      . metoprolol succinate (TOPROL-XL) 100 MG 24 hr tablet Take 100 mg by mouth daily. Take with or immediately following a meal.      . omeprazole (PRILOSEC) 20 MG capsule Take 20 mg by mouth 2 (two) times daily.      . pregabalin (LYRICA) 50 MG capsule Take 50 mg by mouth 2 (two) times daily.      . sucralfate (CARAFATE) 1 G tablet Take 1 g by mouth 4 (four) times daily.      . traMADol (ULTRAM) 50 MG tablet Take 1 tablet (50 mg total) by mouth every 6 (six) hours as needed for pain.  15 tablet  0  . polyethylene glycol-electrolytes (TRILYTE) 420 G solution Take 4,000 mLs by mouth as directed.  4000 mL  0    Allergies as of 09/21/2012 - Review Complete 09/21/2012  Allergen Reaction Noted  . Codeine      Family History  Problem Relation Age of Onset  . Diabetes Father   . Hypertension    . Cancer      lung  . Colon cancer Neg Hx   . Liver cancer Mother     History   Social History  . Marital Status: Legally Separated    Spouse Name: N/A    Number of Children: N/A  . Years of Education: N/A   Occupational History  . Not on file.   Social History Main Topics  . Smoking status: Never Smoker   . Smokeless tobacco: Not on file  . Alcohol Use: Yes     Comment: rare   . Drug Use: No  . Sexually Active: Yes    Birth Control/ Protection: Surgical   Other Topics Concern   . Not on file   Social History Narrative  . No narrative on file    Review of Systems: Gen: SEE HPI CV: Denies chest pain, heart palpitations, peripheral edema, syncope.  Resp: Denies shortness of breath at rest or with exertion. Denies wheezing or cough.  GI: SEE HPI. GU : Denies urinary burning, urinary frequency, urinary hesitancy MS: +fibromyalgia  Derm: Denies rash, itching, dry skin Psych: Denies depression, anxiety, memory loss, and confusion Heme: Denies bruising, bleeding, and enlarged lymph nodes.  Physical Exam: BP 130/78  Pulse 67  Temp 97.4 F (36.3 C) (Temporal)  Ht 5\' 4"  (1.626 m)  Wt 150 lb 6.4 oz (68.221 kg)  BMI 25.82 kg/m2 General:   Alert and oriented. Pleasant and cooperative. Well-nourished and well-developed.  Head:  Normocephalic and atraumatic. Eyes:  Without icterus, sclera clear and conjunctiva pink.  Ears:  Normal auditory acuity. Nose:  No deformity, discharge,  or lesions. Mouth:  No deformity or lesions, oral mucosa pink.  Neck:  Supple, without mass or thyromegaly. Lungs:  Clear to auscultation bilaterally. No wheezes, rales, or rhonchi. No distress.  Heart:  S1, S2 present without murmurs appreciated.  Abdomen:  +BS, soft, non-tender and non-distended. No HSM noted. No guarding or rebound. No masses appreciated.  Rectal:  Deferred  Msk:  Symmetrical without gross deformities. Normal posture. Extremities:  Trace edema bilaterally Neurologic:  Alert and  oriented x4;  grossly normal neurologically. Skin:  Intact without significant lesions or rashes. Cervical Nodes:  No significant cervical adenopathy. Psych:  Alert and cooperative. Normal mood and affect.

## 2012-09-21 NOTE — Progress Notes (Signed)
Faxed to PCP

## 2012-09-21 NOTE — Assessment & Plan Note (Addendum)
78 year old female with worsening constipation, no melena or hematochezia. Denies any abdominal pain, change in medications, change in diet. Last TCS in 2004 with left-sided diverticula. Likely benign etiology with need for increased fiber, supplemental Miralax. However, due to the amount of time since lower GI tract evaluation, it would be appropriate to re-evaluate.  Proceed with TCS with Dr. Jena Gauss in near future: the risks, benefits, and alternatives have been discussed with the patient in detail. The patient states understanding and desires to proceed. Metamucil daily Miralax daily Obtain last TSH results; if not current then repeat to rule out imbalance.

## 2012-09-26 ENCOUNTER — Encounter (HOSPITAL_COMMUNITY): Payer: Self-pay | Admitting: Pharmacy Technician

## 2012-10-05 ENCOUNTER — Ambulatory Visit (HOSPITAL_COMMUNITY)
Admission: RE | Admit: 2012-10-05 | Discharge: 2012-10-05 | Disposition: A | Discharge status: Home / Self Care | Payer: PRIVATE HEALTH INSURANCE | Source: Ambulatory Visit | Attending: Internal Medicine | Admitting: Internal Medicine

## 2012-10-05 ENCOUNTER — Encounter (HOSPITAL_COMMUNITY)
Admission: RE | Disposition: A | Discharge status: Home / Self Care | Payer: Self-pay | Source: Ambulatory Visit | Attending: Internal Medicine

## 2012-10-05 ENCOUNTER — Encounter (HOSPITAL_COMMUNITY): Payer: Self-pay

## 2012-10-05 DIAGNOSIS — D126 Benign neoplasm of colon, unspecified: Secondary | ICD-10-CM | POA: Insufficient documentation

## 2012-10-05 DIAGNOSIS — R194 Change in bowel habit: Secondary | ICD-10-CM

## 2012-10-05 DIAGNOSIS — I10 Essential (primary) hypertension: Secondary | ICD-10-CM | POA: Insufficient documentation

## 2012-10-05 DIAGNOSIS — K5730 Diverticulosis of large intestine without perforation or abscess without bleeding: Secondary | ICD-10-CM | POA: Insufficient documentation

## 2012-10-05 DIAGNOSIS — R198 Other specified symptoms and signs involving the digestive system and abdomen: Secondary | ICD-10-CM

## 2012-10-05 HISTORY — PX: COLONOSCOPY: SHX5424

## 2012-10-05 SURGERY — COLONOSCOPY
Anesthesia: Moderate Sedation

## 2012-10-05 MED ORDER — MEPERIDINE HCL 100 MG/ML IJ SOLN
INTRAMUSCULAR | Status: DC | PRN
  Administered 2012-10-05: 50 mg via INTRAVENOUS

## 2012-10-05 MED ORDER — MEPERIDINE HCL 100 MG/ML IJ SOLN
INTRAMUSCULAR | Status: AC
  Filled 2012-10-05: qty 1

## 2012-10-05 MED ORDER — STERILE WATER FOR IRRIGATION IR SOLN
Status: DC | PRN
  Administered 2012-10-05: 14:00:00

## 2012-10-05 MED ORDER — MIDAZOLAM HCL 5 MG/5ML IJ SOLN
INTRAMUSCULAR | Status: DC | PRN
  Administered 2012-10-05: 2 mg via INTRAVENOUS

## 2012-10-05 MED ORDER — MIDAZOLAM HCL 5 MG/5ML IJ SOLN
INTRAMUSCULAR | Status: AC
  Filled 2012-10-05: qty 10

## 2012-10-05 MED ORDER — SODIUM CHLORIDE 0.45 % IV SOLN
INTRAVENOUS | Status: DC
  Administered 2012-10-05: 13:00:00 via INTRAVENOUS

## 2012-10-05 NOTE — H&P (View-Only) (Signed)
Primary Care Physician:  HAWKINS,EDWARD L, MD Primary Gastroenterologist:  Dr. Rourk   Chief Complaint  Patient presents with  . Constipation    HPI:   [name] presents today at the request of Dr. Hawkins due to recent change in bowel habits. Last colonoscopy in 2004 by Dr. Rourk with left-sided diverticula. Notes new onset constipation. States intermittent constipation, feels she may be "holding it". Feels like incomplete evacuation. No rectal bleeding. States +wt loss of a few lbs then "gained it back". States appetite waxes and wanes, which is her baseline. No abdominal pain. No N/V. No reflux, heartburn. States sometimes doesn't chew food enough. States has thyroid problem. Loves green veggies. No new medications.   Past Medical History  Diagnosis Date  . Esophageal reflux   . HTN (hypertension)   . Arthritis   . Fibromyalgia   . Hypothyroid   . Hashimoto's disease   . Helicobacter pylori gastritis     in remote past, s/p treatment     Past Surgical History  Procedure Date  . Cholecystectomy   . Abdominal hysterectomy     tumor removal; benign  . Total hip arthroplasty   . Colonoscopy 05/22/2003    RMR:Normal rectum/ Left-sided diverticula.  The remainder of the colonic mucosa and terminal  ileum appeared normal  . Bladder surgery   . Esophagogastroduodenoscopy 05/22/2003    RMR: Tiny distal esophageal erosions consistent with mild erosive reflux esophagitisOtherwise, normal upper gastrointestinal tract through the second part of   the duodenum  . Esophagogastroduodenoscopy 07/22/1992    RMR: Normal EGD    Current Outpatient Prescriptions  Medication Sig Dispense Refill  . amLODipine (NORVASC) 10 MG tablet Take 5 mg by mouth daily.       . calcium-vitamin D (OSCAL WITH D) 250-125 MG-UNIT per tablet Take 1 tablet by mouth daily.      . ciprofloxacin (CIPRO) 500 MG tablet Take 1 tablet (500 mg total) by mouth 2 (two) times daily.  14 tablet  0  . cloNIDine  (CATAPRES) 0.1 MG tablet Take 0.1 mg by mouth at bedtime.      . cyclobenzaprine (FLEXERIL) 10 MG tablet Take 10 mg by mouth 3 (three) times daily as needed.      . fish oil-omega-3 fatty acids 1000 MG capsule Take 2 g by mouth daily.      . furosemide (LASIX) 40 MG tablet Take 40 mg by mouth daily.      . ibandronate (BONIVA) 150 MG tablet Take 150 mg by mouth every 30 (thirty) days. Take in the morning with a full glass of water, on an empty stomach, and do not take anything else by mouth or lie down for the next 30 min.      . levothyroxine (SYNTHROID, LEVOTHROID) 100 MCG tablet Take 100 mcg by mouth daily.      . metoprolol succinate (TOPROL-XL) 100 MG 24 hr tablet Take 100 mg by mouth daily. Take with or immediately following a meal.      . omeprazole (PRILOSEC) 20 MG capsule Take 20 mg by mouth 2 (two) times daily.      . pregabalin (LYRICA) 50 MG capsule Take 50 mg by mouth 2 (two) times daily.      . sucralfate (CARAFATE) 1 G tablet Take 1 g by mouth 4 (four) times daily.      . traMADol (ULTRAM) 50 MG tablet Take 1 tablet (50 mg total) by mouth every 6 (six) hours as needed for pain.    15 tablet  0  . polyethylene glycol-electrolytes (TRILYTE) 420 G solution Take 4,000 mLs by mouth as directed.  4000 mL  0    Allergies as of 09/21/2012 - Review Complete 09/21/2012  Allergen Reaction Noted  . Codeine      Family History  Problem Relation Age of Onset  . Diabetes Father   . Hypertension    . Cancer      lung  . Colon cancer Neg Hx   . Liver cancer Mother     History   Social History  . Marital Status: Legally Separated    Spouse Name: N/A    Number of Children: N/A  . Years of Education: N/A   Occupational History  . Not on file.   Social History Main Topics  . Smoking status: Never Smoker   . Smokeless tobacco: Not on file  . Alcohol Use: Yes     Comment: rare   . Drug Use: No  . Sexually Active: Yes    Birth Control/ Protection: Surgical   Other Topics Concern   . Not on file   Social History Narrative  . No narrative on file    Review of Systems: Gen: SEE HPI CV: Denies chest pain, heart palpitations, peripheral edema, syncope.  Resp: Denies shortness of breath at rest or with exertion. Denies wheezing or cough.  GI: SEE HPI. GU : Denies urinary burning, urinary frequency, urinary hesitancy MS: +fibromyalgia  Derm: Denies rash, itching, dry skin Psych: Denies depression, anxiety, memory loss, and confusion Heme: Denies bruising, bleeding, and enlarged lymph nodes.  Physical Exam: BP 130/78  Pulse 67  Temp 97.4 F (36.3 C) (Temporal)  Ht 5' 4" (1.626 m)  Wt 150 lb 6.4 oz (68.221 kg)  BMI 25.82 kg/m2 General:   Alert and oriented. Pleasant and cooperative. Well-nourished and well-developed.  Head:  Normocephalic and atraumatic. Eyes:  Without icterus, sclera clear and conjunctiva pink.  Ears:  Normal auditory acuity. Nose:  No deformity, discharge,  or lesions. Mouth:  No deformity or lesions, oral mucosa pink.  Neck:  Supple, without mass or thyromegaly. Lungs:  Clear to auscultation bilaterally. No wheezes, rales, or rhonchi. No distress.  Heart:  S1, S2 present without murmurs appreciated.  Abdomen:  +BS, soft, non-tender and non-distended. No HSM noted. No guarding or rebound. No masses appreciated.  Rectal:  Deferred  Msk:  Symmetrical without gross deformities. Normal posture. Extremities:  Trace edema bilaterally Neurologic:  Alert and  oriented x4;  grossly normal neurologically. Skin:  Intact without significant lesions or rashes. Cervical Nodes:  No significant cervical adenopathy. Psych:  Alert and cooperative. Normal mood and affect.    

## 2012-10-05 NOTE — Interval H&P Note (Signed)
History and Physical Interval Note:  10/05/2012 1:46 PM  Brenda Erickson  has presented today for surgery, with the diagnosis of CHANGE IN BOWEL HABITS  The various methods of treatment have been discussed with the patient and family. After consideration of risks, benefits and other options for treatment, the patient has consented to  Procedure(s) (LRB) with comments: COLONOSCOPY (N/A) - 12:15 as a surgical intervention .  The patient's history has been reviewed, patient examined, no change in status, stable for surgery.  I have reviewed the patient's chart and labs.  Questions were answered to the patient's satisfaction.     Brenda Erickson  Patient seen and examined. Colonoscopy per plan. States MiraLax and Metamucil have helped significantly with constipation since being seen in the office. The risks, benefits, limitations, alternatives and imponderables have been reviewed with the patient. Questions have been answered. All parties are agreeable.

## 2012-10-05 NOTE — Op Note (Signed)
[facility] [address] [place], [address]   COLONOSCOPY PROCEDURE REPORT  PATIENT: Brenda, Erickson  MR#:         [id number] BIRTHDATE: 27-Sep-1933 , 79  yrs. old GENDER: Female ENDOSCOPIST: R.  Roetta Sessions, MD FACP [facility] REFERRED BY:  Kari Baars, M.D. PROCEDURE DATE:  10/05/2012 PROCEDURE:     Colonoscopy with polypectomy  INDICATIONS: change in bowel habits; colorectal cancer screening-average risk.  INFORMED CONSENT:  The risks, benefits, alternatives and imponderables including but not limited to bleeding, perforation as well as the possibility of a missed lesion have been reviewed.  The potential for biopsy, lesion removal, etc. have also been discussed.  Questions have been answered.  All parties agreeable. Please see the history and physical in the medical record for more information.  MEDICATIONS: Versed 3 mg IV and Demerol 75 mg IV in divided doses.  DESCRIPTION OF PROCEDURE:  After a digital rectal exam was performed, the EC-[id number] ([id number])  colonoscope was advanced from the anus through the rectum and colon to the area of the cecum, ileocecal valve and appendiceal orifice.  The cecum was deeply intubated.  These structures were well-seen and photographed for the record.  From the level of the cecum and ileocecal valve, the scope was slowly and cautiously withdrawn.  The mucosal surfaces were carefully surveyed utilizing scope tip deflection to facilitate fold flattening as needed.  The scope was pulled down into the rectum where a thorough examination including retroflexion was performed.    FINDINGS:  Adequate preparation. Normal rectum.  Left-sided diverticula; (1) 5 mm  sessile polyp with central depression on the distal side of ileocecal valve. There was a second 5 mm polyp in the mid transverse segments; the remainder of the colonic mucosa appeared normal.  THERAPEUTIC / DIAGNOSTIC MANEUVERS PERFORMED:   The  above-mentioned polyps were hot and cold snared, respectively.  COMPLICATIONS: None   CECAL WITHDRAWAL TIME:  11 minutes  IMPRESSION:  Colonic diverticulosis. Colonic polyps-removed as described above  RECOMMENDATIONS: Continued Metamucil and MiraLax. Followup on pathology.   _______________________________ eSigned:  R. Roetta Sessions, MD FACP [facility] 10/05/2012 2:22 PM   CC:

## 2012-10-05 NOTE — Op Note (Signed)
1359 Patient aroused and uncomfortable. Gave Versed 1mg  and Demerol 25mg  per Dr. Luvenia Starch orders.

## 2012-10-09 ENCOUNTER — Encounter (HOSPITAL_COMMUNITY): Payer: Self-pay | Admitting: Internal Medicine

## 2012-10-12 ENCOUNTER — Encounter: Payer: Self-pay | Admitting: Internal Medicine

## 2012-10-12 ENCOUNTER — Encounter: Payer: Self-pay | Admitting: *Deleted

## 2013-03-17 ENCOUNTER — Encounter (HOSPITAL_COMMUNITY): Payer: Self-pay | Admitting: *Deleted

## 2013-03-17 ENCOUNTER — Emergency Department (HOSPITAL_COMMUNITY)
Admission: EM | Admit: 2013-03-17 | Discharge: 2013-03-17 | Disposition: A | Discharge status: Home / Self Care | Payer: PRIVATE HEALTH INSURANCE | Attending: Emergency Medicine | Admitting: Emergency Medicine

## 2013-03-17 DIAGNOSIS — E039 Hypothyroidism, unspecified: Secondary | ICD-10-CM | POA: Insufficient documentation

## 2013-03-17 DIAGNOSIS — I10 Essential (primary) hypertension: Secondary | ICD-10-CM | POA: Insufficient documentation

## 2013-03-17 DIAGNOSIS — R6883 Chills (without fever): Secondary | ICD-10-CM | POA: Insufficient documentation

## 2013-03-17 DIAGNOSIS — Z87448 Personal history of other diseases of urinary system: Secondary | ICD-10-CM | POA: Insufficient documentation

## 2013-03-17 DIAGNOSIS — Z8639 Personal history of other endocrine, nutritional and metabolic disease: Secondary | ICD-10-CM | POA: Insufficient documentation

## 2013-03-17 DIAGNOSIS — Z9089 Acquired absence of other organs: Secondary | ICD-10-CM | POA: Insufficient documentation

## 2013-03-17 DIAGNOSIS — G479 Sleep disorder, unspecified: Secondary | ICD-10-CM | POA: Insufficient documentation

## 2013-03-17 DIAGNOSIS — Z90710 Acquired absence of both cervix and uterus: Secondary | ICD-10-CM | POA: Insufficient documentation

## 2013-03-17 DIAGNOSIS — Z79899 Other long term (current) drug therapy: Secondary | ICD-10-CM | POA: Insufficient documentation

## 2013-03-17 DIAGNOSIS — R110 Nausea: Secondary | ICD-10-CM | POA: Insufficient documentation

## 2013-03-17 DIAGNOSIS — K219 Gastro-esophageal reflux disease without esophagitis: Secondary | ICD-10-CM | POA: Insufficient documentation

## 2013-03-17 DIAGNOSIS — Z862 Personal history of diseases of the blood and blood-forming organs and certain disorders involving the immune mechanism: Secondary | ICD-10-CM | POA: Insufficient documentation

## 2013-03-17 DIAGNOSIS — N390 Urinary tract infection, site not specified: Secondary | ICD-10-CM | POA: Insufficient documentation

## 2013-03-17 DIAGNOSIS — R630 Anorexia: Secondary | ICD-10-CM | POA: Insufficient documentation

## 2013-03-17 DIAGNOSIS — Z8719 Personal history of other diseases of the digestive system: Secondary | ICD-10-CM | POA: Insufficient documentation

## 2013-03-17 DIAGNOSIS — M129 Arthropathy, unspecified: Secondary | ICD-10-CM | POA: Insufficient documentation

## 2013-03-17 LAB — COMPREHENSIVE METABOLIC PANEL
ALT: 143 U/L — ABNORMAL HIGH (ref 0–35)
AST: 75 U/L — ABNORMAL HIGH (ref 0–37)
Albumin: 3.9 g/dL (ref 3.5–5.2)
Alkaline Phosphatase: 85 U/L (ref 39–117)
BUN: 7 mg/dL (ref 6–23)
CO2: 34 mEq/L — ABNORMAL HIGH (ref 19–32)
Calcium: 10 mg/dL (ref 8.4–10.5)
Chloride: 98 mEq/L (ref 96–112)
Creatinine, Ser: 0.82 mg/dL (ref 0.50–1.10)
GFR calc Af Amer: 77 mL/min — ABNORMAL LOW (ref >90)
GFR calc non Af Amer: 67 mL/min — ABNORMAL LOW (ref >90)
Glucose, Bld: 118 mg/dL — ABNORMAL HIGH (ref 70–99)
Potassium: 3.3 mEq/L — ABNORMAL LOW (ref 3.5–5.1)
Sodium: 141 mEq/L (ref 135–145)
Total Bilirubin: 0.5 mg/dL (ref 0.3–1.2)
Total Protein: 7.3 g/dL (ref 6.0–8.3)

## 2013-03-17 LAB — CBC WITH DIFFERENTIAL/PLATELET
Basophils Absolute: 0 10*3/uL (ref 0.0–0.1)
Basophils Relative: 1 % (ref 0–1)
Eosinophils Absolute: 0.2 10*3/uL (ref 0.0–0.7)
Eosinophils Relative: 2 % (ref 0–5)
HCT: 42.6 % (ref 36.0–46.0)
Hemoglobin: 14.3 g/dL (ref 12.0–15.0)
Lymphocytes Relative: 38 % (ref 12–46)
Lymphs Abs: 2.5 10*3/uL (ref 0.7–4.0)
MCH: 29.9 pg (ref 26.0–34.0)
MCHC: 33.6 g/dL (ref 30.0–36.0)
MCV: 89.1 fL (ref 78.0–100.0)
Monocytes Absolute: 0.5 10*3/uL (ref 0.1–1.0)
Monocytes Relative: 7 % (ref 3–12)
Neutro Abs: 3.4 10*3/uL (ref 1.7–7.7)
Neutrophils Relative %: 52 % (ref 43–77)
Platelets: 343 10*3/uL (ref 150–400)
RBC: 4.78 MIL/uL (ref 3.87–5.11)
RDW: 14.1 % (ref 11.5–15.5)
WBC: 6.6 10*3/uL (ref 4.0–10.5)

## 2013-03-17 LAB — URINE MICROSCOPIC-ADD ON

## 2013-03-17 LAB — URINALYSIS, ROUTINE W REFLEX MICROSCOPIC
Bilirubin Urine: NEGATIVE
Glucose, UA: NEGATIVE mg/dL
Hgb urine dipstick: NEGATIVE
Ketones, ur: NEGATIVE mg/dL
Nitrite: NEGATIVE
Protein, ur: NEGATIVE mg/dL
Specific Gravity, Urine: <1.005 — ABNORMAL LOW (ref 1.005–1.030)
Urobilinogen, UA: 0.2 mg/dL (ref 0.0–1.0)
pH: 6.5 (ref 5.0–8.0)

## 2013-03-17 MED ORDER — LIDOCAINE HCL (PF) 1 % IJ SOLN
INTRAMUSCULAR | Status: AC
  Administered 2013-03-17: 16:00:00
  Filled 2013-03-17: qty 5

## 2013-03-17 MED ORDER — POTASSIUM CHLORIDE CRYS ER 20 MEQ PO TBCR
20.0000 meq | EXTENDED_RELEASE_TABLET | Freq: Once | ORAL | Status: AC
  Administered 2013-03-17: 20 meq via ORAL
  Filled 2013-03-17: qty 1

## 2013-03-17 MED ORDER — ONDANSETRON HCL 4 MG/2ML IJ SOLN
4.0000 mg | Freq: Once | INTRAMUSCULAR | Status: AC
  Administered 2013-03-17: 4 mg via INTRAVENOUS
  Filled 2013-03-17: qty 2

## 2013-03-17 MED ORDER — SODIUM CHLORIDE 0.9 % IV BOLUS (SEPSIS)
1000.0000 mL | Freq: Once | INTRAVENOUS | Status: AC
  Administered 2013-03-17: 1000 mL via INTRAVENOUS

## 2013-03-17 MED ORDER — PANTOPRAZOLE SODIUM 40 MG IV SOLR
40.0000 mg | Freq: Once | INTRAVENOUS | Status: AC
  Administered 2013-03-17: 40 mg via INTRAVENOUS
  Filled 2013-03-17: qty 40

## 2013-03-17 MED ORDER — CEPHALEXIN 500 MG PO CAPS: 500.0000 mg | ORAL_CAPSULE | Freq: Four times a day (QID) | ORAL | Status: DC

## 2013-03-17 MED ORDER — CEFTRIAXONE SODIUM 1 G IJ SOLR
1.0000 g | Freq: Once | INTRAMUSCULAR | Status: AC
  Administered 2013-03-17: 1 g via INTRAMUSCULAR
  Filled 2013-03-17: qty 10

## 2013-03-17 NOTE — ED Notes (Signed)
intermittent nausea x 2 wks.  Reports decreased appetite.  Denies v/d.  Denies pain.

## 2013-03-17 NOTE — ED Provider Notes (Signed)
History     This chart was scribed for Raeford Razor, MD, MD by Smitty Pluck, ED Scribe. The patient was seen in room APA01/APA01 and the patient's care was started at 1:28 PM.   CSN: [id number]  Arrival date & time 03/17/13  1118       Chief Complaint  Patient presents with  . Nausea    The history is provided by the patient and medical records. No language interpreter was used.   HPI Comments: Brenda Erickson is a 79 y.o. female with hx of HTN who presents to the Emergency Department complaining of intermittent, moderate nausea onset 2 weeks ago. She reports that she has loss of appetite due to nausea. She reports that she has decreased sleep due to nausea. She mentions that applying pressure to abdomen aggravates the nausea. Pt states that she has intermittent, chills. She states she takes medication for acid reflux. She has taken abx recently for bladder infection. Pt denies abdominal pain, fever, vomiting, diarrhea, weakness, cough, SOB and any other pain.    Past Medical History  Diagnosis Date  . Esophageal reflux   . HTN (hypertension)   . Arthritis   . Fibromyalgia   . Hypothyroid   . Hashimoto's disease   . Helicobacter pylori gastritis     in remote past, s/p treatment     Past Surgical History  Procedure Laterality Date  . Cholecystectomy    . Abdominal hysterectomy      tumor removal; benign  . Total hip arthroplasty    . Colonoscopy  05/22/2003    WUJ:WJXBJY rectum/ Left-sided diverticula.  The remainder of the colonic mucosa and terminal  ileum appeared normal  . Bladder surgery    . Esophagogastroduodenoscopy  05/22/2003    RMR: Tiny distal esophageal erosions consistent with mild erosive reflux esophagitisOtherwise, normal upper gastrointestinal tract through the second part of   the duodenum  . Esophagogastroduodenoscopy  07/22/1992    RMR: Normal EGD  . Colonoscopy  10/05/2012    Procedure: COLONOSCOPY;  Surgeon: Corbin Ade, MD;  Location: [facility]  ENDO SUITE;  Service: Endoscopy;  Laterality: N/A;  12:15    Family History  Problem Relation Age of Onset  . Diabetes Father   . Hypertension    . Cancer      lung  . Colon cancer Neg Hx   . Liver cancer Mother     History  Substance Use Topics  . Smoking status: Never Smoker   . Smokeless tobacco: Not on file  . Alcohol Use: No     Comment: rare     OB History   Grav Para Term Preterm Abortions TAB SAB Ect Mult Living                  Review of Systems 10 Systems reviewed and all are negative for acute change except as noted in the HPI.   Allergies  Codeine  Home Medications   Current Outpatient Rx  Name  Route  Sig  Dispense  Refill  . amLODipine (NORVASC) 10 MG tablet   Oral   Take 10 mg by mouth daily.          . calcium-vitamin D (OSCAL WITH D) 250-125 MG-UNIT per tablet   Oral   Take 1 tablet by mouth daily.         . cloNIDine (CATAPRES) 0.1 MG tablet   Oral   Take 0.1 mg by mouth at bedtime.         Marland Kitchen  fish oil-omega-3 fatty acids 1000 MG capsule   Oral   Take 2 g by mouth daily.         . furosemide (LASIX) 40 MG tablet   Oral   Take 40 mg by mouth daily.         Marland Kitchen levothyroxine (SYNTHROID, LEVOTHROID) 100 MCG tablet   Oral   Take 100 mcg by mouth daily.         . metoprolol succinate (TOPROL-XL) 100 MG 24 hr tablet   Oral   Take 100 mg by mouth daily. Take with or immediately following a meal.         . omeprazole (PRILOSEC) 20 MG capsule   Oral   Take 20 mg by mouth 2 (two) times daily.         . pregabalin (LYRICA) 50 MG capsule   Oral   Take 50 mg by mouth 2 (two) times daily.         . sucralfate (CARAFATE) 1 G tablet   Oral   Take 1 g by mouth 4 (four) times daily.         Marland Kitchen tiZANidine (ZANAFLEX) 4 MG tablet   Oral   Take 4 mg by mouth 2 (two) times daily.         Marland Kitchen ibandronate (BONIVA) 150 MG tablet   Oral   Take 150 mg by mouth every 30 (thirty) days. Take in the morning with a full glass of  water, on an empty stomach, and do not take anything else by mouth or lie down for the next 30 min.           BP 119/65  Pulse 60  Temp(Src) 99 F (37.2 C) (Oral)  Resp 16  Ht 5\' 4"  (1.626 m)  Wt 149 lb (67.586 kg)  BMI 25.56 kg/m2  SpO2 96%  Physical Exam  Nursing note and vitals reviewed. Constitutional: She appears well-developed and well-nourished. No distress.  HENT:  Head: Normocephalic and atraumatic.  Eyes: Conjunctivae are normal. Right eye exhibits no discharge. Left eye exhibits no discharge.  Neck: Neck supple.  Cardiovascular: Normal rate, regular rhythm and normal heart sounds.  Exam reveals no gallop and no friction rub.   No murmur heard. Pulmonary/Chest: Effort normal and breath sounds normal. No respiratory distress.  Abdominal: Soft. She exhibits no distension. There is no tenderness. There is no rebound and no guarding.  Musculoskeletal: She exhibits no edema and no tenderness.  Neurological: She is alert.  Skin: Skin is warm and dry.  Psychiatric: She has a normal mood and affect. Her behavior is normal. Thought content normal.    ED Course  Procedures (including critical care time) DIAGNOSTIC STUDIES: Oxygen Saturation is 96% on room air, adequate by my interpretation.    COORDINATION OF CARE: 1:30 PM Discussed ED treatment with pt and pt agrees.     Labs Reviewed  COMPREHENSIVE METABOLIC PANEL - Abnormal; Notable for the following:    Potassium 3.3 (*)    CO2 34 (*)    Glucose, Bld 118 (*)    AST 75 (*)    ALT 143 (*)    GFR calc non Af Amer 67 (*)    GFR calc Af Amer 77 (*)    All other components within normal limits  URINALYSIS, ROUTINE W REFLEX MICROSCOPIC - Abnormal; Notable for the following:    Specific Gravity, Urine <1.005 (*)    Leukocytes, UA LARGE (*)    All other components within  normal limits  URINE MICROSCOPIC-ADD ON - Abnormal; Notable for the following:    Squamous Epithelial / LPF FEW (*)    Bacteria, UA FEW (*)     All other components within normal limits  URINE CULTURE  CBC WITH DIFFERENTIAL   No results found.  EKG:  Rhythm: Rate:  Intervals: ST segments:   1. Nausea   2. UTI (urinary tract infection)       MDM  80yF with nausea and anorexia. Otherwise no complaints. Denies pain. Abdominal exam benign. HD stable. UA questionable for UTI. Pt denies specific urinary complaints, but in light of nausea, will tx as possible etiology. Culture sent. Scripts for abx and PRN zofran. Outpt FU.    I personally preformed the services scribed in my presence. The recorded information has been reviewed is accurate. Raeford Razor, MD.      Raeford Razor, MD 03/17/13 [phone]

## 2013-03-19 LAB — URINE CULTURE: Colony Count: 15000

## 2013-03-22 ENCOUNTER — Other Ambulatory Visit (HOSPITAL_COMMUNITY): Payer: Self-pay | Admitting: Urology

## 2013-03-22 DIAGNOSIS — N390 Urinary tract infection, site not specified: Secondary | ICD-10-CM

## 2013-03-24 ENCOUNTER — Ambulatory Visit (HOSPITAL_COMMUNITY)
Admission: RE | Admit: 2013-03-24 | Discharge: 2013-03-24 | Disposition: A | Discharge status: Home / Self Care | Payer: PRIVATE HEALTH INSURANCE | Source: Ambulatory Visit | Attending: Urology | Admitting: Urology

## 2013-03-24 DIAGNOSIS — K5730 Diverticulosis of large intestine without perforation or abscess without bleeding: Secondary | ICD-10-CM | POA: Insufficient documentation

## 2013-03-24 DIAGNOSIS — R319 Hematuria, unspecified: Secondary | ICD-10-CM | POA: Insufficient documentation

## 2013-03-24 DIAGNOSIS — D4959 Neoplasm of unspecified behavior of other genitourinary organ: Secondary | ICD-10-CM | POA: Insufficient documentation

## 2013-03-24 DIAGNOSIS — N390 Urinary tract infection, site not specified: Secondary | ICD-10-CM

## 2013-03-24 MED ORDER — IOHEXOL 300 MG/ML  SOLN: 100.0000 mL | Freq: Once | INTRAMUSCULAR | Status: AC | PRN

## 2013-03-24 MED ORDER — IOHEXOL 300 MG/ML  SOLN
125.0000 mL | Freq: Once | INTRAMUSCULAR | Status: AC | PRN
  Administered 2013-03-24: 125 mL via INTRAVENOUS

## 2013-05-02 DIAGNOSIS — N2889 Other specified disorders of kidney and ureter: Secondary | ICD-10-CM

## 2013-05-02 HISTORY — DX: Other specified disorders of kidney and ureter: N28.89

## 2013-05-16 DIAGNOSIS — Z8719 Personal history of other diseases of the digestive system: Secondary | ICD-10-CM

## 2013-05-16 DIAGNOSIS — M545 Low back pain, unspecified: Secondary | ICD-10-CM

## 2013-05-16 DIAGNOSIS — M79604 Pain in right leg: Secondary | ICD-10-CM

## 2013-05-16 DIAGNOSIS — E063 Autoimmune thyroiditis: Secondary | ICD-10-CM | POA: Insufficient documentation

## 2013-05-16 HISTORY — DX: Personal history of other diseases of the digestive system: Z87.19

## 2013-05-16 HISTORY — DX: Pain in right leg: M79.604

## 2013-05-16 HISTORY — DX: Low back pain, unspecified: M54.50

## 2013-05-30 ENCOUNTER — Other Ambulatory Visit (HOSPITAL_COMMUNITY): Payer: Self-pay | Admitting: General Surgery

## 2013-05-30 DIAGNOSIS — Z139 Encounter for screening, unspecified: Secondary | ICD-10-CM

## 2013-06-14 ENCOUNTER — Telehealth: Payer: Self-pay | Admitting: Gastroenterology

## 2013-06-14 NOTE — Telephone Encounter (Signed)
I was following up on my inbox reminders.  Let's offer this nice gentleman a routine office visit for follow-up, as he was having issues with bowel habits at last visit. If he is doing well, he can return prn.

## 2013-06-15 ENCOUNTER — Encounter: Payer: Self-pay | Admitting: Gastroenterology

## 2013-06-15 NOTE — Telephone Encounter (Signed)
Mailed letter to patient to call our office to set up OV °

## 2013-07-06 ENCOUNTER — Ambulatory Visit (HOSPITAL_COMMUNITY)
Admission: RE | Admit: 2013-07-06 | Discharge: 2013-07-06 | Disposition: A | Discharge status: Home / Self Care | Payer: PRIVATE HEALTH INSURANCE | Source: Ambulatory Visit | Attending: General Surgery | Admitting: General Surgery

## 2013-07-06 DIAGNOSIS — Z1231 Encounter for screening mammogram for malignant neoplasm of breast: Secondary | ICD-10-CM | POA: Insufficient documentation

## 2013-07-06 DIAGNOSIS — Z139 Encounter for screening, unspecified: Secondary | ICD-10-CM

## 2013-07-17 ENCOUNTER — Encounter: Payer: Self-pay | Admitting: Internal Medicine

## 2013-07-18 ENCOUNTER — Encounter: Payer: Self-pay | Admitting: Gastroenterology

## 2013-07-18 ENCOUNTER — Ambulatory Visit (INDEPENDENT_AMBULATORY_CARE_PROVIDER_SITE_OTHER): Payer: PRIVATE HEALTH INSURANCE | Admitting: Gastroenterology

## 2013-07-18 VITALS — BP 126/60 | HR 61 | Temp 98.3°F | Ht 64.0 in | Wt 143.6 lb

## 2013-07-18 DIAGNOSIS — K5900 Constipation, unspecified: Secondary | ICD-10-CM

## 2013-07-18 NOTE — Progress Notes (Signed)
Referring Provider: Fredirick Maudlin, MD Primary Care Physician:  Fredirick Maudlin, MD Primary GI: Dr. Jena Gauss   Chief Complaint  Patient presents with  . Follow-up    HPI:   79 year old female presents today in follow-up with a history of chronic constipation. TCS performed in interim since last visit; due to history of adenomas, will be due for surveillance in 5 years if health permits.  BM usually every day; if she doesn't have it, she feels uncomfortable. When "moving regular" it is soft. Ordering some metamucil from [employer]. Metamucil helps with bowel movements. Does not want to try medication. Recently underwent left partial nephrectomy due to renal cancer.   Past Medical History  Diagnosis Date  . Esophageal reflux   . HTN (hypertension)   . Arthritis   . Fibromyalgia   . Hypothyroid   . Hashimoto's disease   . Helicobacter pylori gastritis     in remote past, s/p treatment     Past Surgical History  Procedure Laterality Date  . Cholecystectomy    . Abdominal hysterectomy      tumor removal; benign  . Total hip arthroplasty    . Colonoscopy  05/22/2003    ZOX:[id number] rectum/ Left-sided diverticula.  The remainder of the colonic mucosa and terminal  ileum appeared normal  . Bladder surgery    . Esophagogastroduodenoscopy  05/22/2003    RMR: Tiny distal esophageal erosions consistent with mild erosive reflux esophagitisOtherwise, normal upper gastrointestinal tract through the second part of   the duodenum  . Esophagogastroduodenoscopy  07/22/1992    RMR: Normal EGD  . Colonoscopy  10/05/2012    WUJ:WJXBJYN diverticulosis. Tubular adenomas   . Robotic assited partial nephrectomy      left. States was cancerous.     Current Outpatient Prescriptions  Medication Sig Dispense Refill  . amLODipine (NORVASC) 10 MG tablet Take 5 mg by mouth daily.       . calcium-vitamin D (OSCAL WITH D) 250-125 MG-UNIT per tablet Take 1 tablet by mouth daily.      .  cloNIDine (CATAPRES) 0.1 MG tablet Take 0.1 mg by mouth at bedtime.      . fish oil-omega-3 fatty acids 1000 MG capsule Take 2 g by mouth daily.      . furosemide (LASIX) 40 MG tablet Take 40 mg by mouth daily.      Marland Kitchen HYDROcodone-acetaminophen (NORCO/VICODIN) 5-325 MG per tablet Take 1 tablet by mouth every 6 (six) hours as needed.       . ibandronate (BONIVA) 150 MG tablet Take 150 mg by mouth every 30 (thirty) days. Take in the morning with a full glass of water, on an empty stomach, and do not take anything else by mouth or lie down for the next 30 min.      Marland Kitchen levothyroxine (SYNTHROID, LEVOTHROID) 100 MCG tablet Take 100 mcg by mouth daily.      . metoprolol succinate (TOPROL-XL) 100 MG 24 hr tablet Take 100 mg by mouth daily. Take with or immediately following a meal.      . mirtazapine (REMERON) 15 MG tablet Take 7.5 mg by mouth at bedtime.       Marland Kitchen omeprazole (PRILOSEC) 20 MG capsule Take 20 mg by mouth 2 (two) times daily.      . pregabalin (LYRICA) 50 MG capsule Take 50 mg by mouth 2 (two) times daily.      . sucralfate (CARAFATE) 1 G tablet Take 1 g by mouth 4 (four) times  daily.      . torsemide (DEMADEX) 20 MG tablet Take 20 mg by mouth daily.       . cephALEXin (KEFLEX) 500 MG capsule Take 1 capsule (500 mg total) by mouth 4 (four) times daily.  20 capsule  0  . tiZANidine (ZANAFLEX) 4 MG tablet Take 4 mg by mouth 2 (two) times daily.       No current facility-administered medications for this visit.    Allergies as of 07/18/2013 - Review Complete 07/18/2013  Allergen Reaction Noted  . Codeine Nausea And Vomiting     Family History  Problem Relation Age of Onset  . Diabetes Father   . Hypertension    . Cancer      lung  . Colon cancer Neg Hx   . Liver cancer Mother     History   Social History  . Marital Status: Legally Separated    Spouse Name: N/A    Number of Children: N/A  . Years of Education: N/A   Social History Main Topics  . Smoking status: Never Smoker    . Smokeless tobacco: None  . Alcohol Use: No     Comment: rare   . Drug Use: No  . Sexual Activity: Yes    Birth Control/ Protection: Surgical   Other Topics Concern  . None   Social History Narrative  . None    Review of Systems: Negative unless mentioned in HPI.   Physical Exam: BP 126/60  Pulse 61  Temp(Src) 98.3 F (36.8 C) (Oral)  Ht 5\' 4"  (1.626 m)  Wt 143 lb 9.6 oz (65.137 kg)  BMI 24.64 kg/m2 General:   Alert and oriented. No distress noted. Pleasant and cooperative.  Head:  Normocephalic and atraumatic. Eyes:  Conjuctiva clear without scleral icterus. Heart:  S1, S2 present without murmurs, rubs, or gallops. Regular rate and rhythm. Abdomen:  +BS, soft, non-tender and non-distended. No rebound or guarding. No HSM or masses noted. Msk:  Symmetrical without gross deformities. Normal posture. Extremities:  Without edema. Neurologic:  Alert and  oriented x4;  grossly normal neurologically. Skin:  Intact without significant lesions or rashes. Psych:  Alert and cooperative. Normal mood and affect.

## 2013-07-18 NOTE — Patient Instructions (Addendum)
Take Metamucil each day. Please call us if you have worsening of constipation.   We will see you back as needed.  Next colonoscopy in 5 years. Please call if you need anything!

## 2013-07-19 ENCOUNTER — Encounter: Payer: Self-pay | Admitting: Gastroenterology

## 2013-07-19 NOTE — Assessment & Plan Note (Signed)
79 year old female with chronic constipation, doing well on Metamucil daily. Offered prescription medication for assistance with management, but she would rather continue with only fiber supplementation. Appears to be working well for her.   Continue Metamucil daily Next surveillance TCS in 2018 if health permits Return prn

## 2013-07-19 NOTE — Progress Notes (Signed)
CC'd to PCP 

## 2013-12-19 ENCOUNTER — Other Ambulatory Visit (HOSPITAL_COMMUNITY): Payer: Self-pay | Admitting: Pulmonary Disease

## 2013-12-19 DIAGNOSIS — Z780 Asymptomatic menopausal state: Secondary | ICD-10-CM

## 2013-12-26 ENCOUNTER — Other Ambulatory Visit (HOSPITAL_COMMUNITY): Payer: PRIVATE HEALTH INSURANCE

## 2014-01-02 ENCOUNTER — Other Ambulatory Visit (HOSPITAL_COMMUNITY): Payer: PRIVATE HEALTH INSURANCE

## 2014-01-30 ENCOUNTER — Ambulatory Visit (HOSPITAL_COMMUNITY)
Admission: RE | Admit: 2014-01-30 | Discharge: 2014-01-30 | Disposition: A | Discharge status: Home / Self Care | Payer: PRIVATE HEALTH INSURANCE | Source: Ambulatory Visit | Attending: Pulmonary Disease | Admitting: Pulmonary Disease

## 2014-01-30 DIAGNOSIS — Z780 Asymptomatic menopausal state: Secondary | ICD-10-CM | POA: Insufficient documentation

## 2014-01-30 DIAGNOSIS — M899 Disorder of bone, unspecified: Secondary | ICD-10-CM | POA: Insufficient documentation

## 2014-01-30 DIAGNOSIS — E559 Vitamin D deficiency, unspecified: Secondary | ICD-10-CM | POA: Insufficient documentation

## 2014-01-30 DIAGNOSIS — M949 Disorder of cartilage, unspecified: Principal | ICD-10-CM

## 2014-04-12 ENCOUNTER — Other Ambulatory Visit: Payer: Self-pay | Admitting: Adult Health

## 2014-04-18 ENCOUNTER — Other Ambulatory Visit: Payer: Self-pay | Admitting: Adult Health

## 2014-05-16 ENCOUNTER — Ambulatory Visit (INDEPENDENT_AMBULATORY_CARE_PROVIDER_SITE_OTHER): Payer: PRIVATE HEALTH INSURANCE | Admitting: Adult Health

## 2014-05-16 ENCOUNTER — Encounter: Payer: Self-pay | Admitting: Adult Health

## 2014-05-16 VITALS — BP 160/72 | HR 76 | Ht 64.0 in | Wt 146.5 lb

## 2014-05-16 DIAGNOSIS — Z01419 Encounter for gynecological examination (general) (routine) without abnormal findings: Secondary | ICD-10-CM

## 2014-05-16 DIAGNOSIS — R10813 Right lower quadrant abdominal tenderness: Secondary | ICD-10-CM

## 2014-05-16 DIAGNOSIS — Z1212 Encounter for screening for malignant neoplasm of rectum: Secondary | ICD-10-CM

## 2014-05-16 HISTORY — DX: Right lower quadrant abdominal tenderness: R10.813

## 2014-05-16 LAB — HEMOCCULT GUIAC POC 1CARD (OFFICE): Fecal Occult Blood, POC: NEGATIVE

## 2014-05-16 NOTE — Patient Instructions (Signed)
Physical in  2 years  mammogram yearly Return in 1 week for [place]

## 2014-05-16 NOTE — Progress Notes (Signed)
Patient ID: Brenda Erickson, female   DOB: [birth year], 80 y.o.   MRN: [id number] History of Present Illness: Brenda Erickson is a 80 year old black female,widowed, in for a gyn physical.She had left kidney cancer last year with surgery.and is being treated for bronchitis by PCP.   Current Medications, Allergies, Past Medical History, Past Surgical History, Family History and Social History were reviewed in [employer] record.     Review of Systems: Patient denies any headaches, blurred vision, shortness of breath, chest pain, abdominal pain, problems with bowel movements, urination, or intercourse. Not having sex, no joint pain or mood swings.She is using cane but moves well for age.She has some constipation at times, but says if she eats sugar she goes.    Physical Exam:BP 160/72  Pulse 76  Ht 5\' 4"  (1.626 m)  Wt 146 lb 8 oz (66.452 kg)  BMI 25.13 kg/m2 General:  Well developed, well nourished, no acute distress Skin:  Warm and dry Neck:  Midline trachea, normal thyroid, no carotid bruits Lungs; Clear to auscultation bilaterally Breast:  No dominant palpable mass, retraction, or nipple discharge Cardiovascular: Regular rate and rhythm Abdomen:  Soft, non tender, no hepatosplenomegaly Pelvic:  External genitalia is normal in appearance for age.  The vagina has loss of color, moisture and rugae.The cervix and uterus are absent.  No  adnexal masses, has some RLQ tenderness noted on  Exam. Rectal: Good sphincter tone, no polyps, or hemorrhoids felt.  Hemoccult negative. Extremities:  No swelling or varicosities noted Psych:  No mood changes, alert and cooperative,seems happy   Impression: Yearly gyn exam no pap RLQ tenderness on exam    Plan: Return in 1 week for [place] and see me Physical in  2 years Mammogram yearly Colonoscopy per GI   Labs with PCP

## 2014-05-23 ENCOUNTER — Encounter: Payer: Self-pay | Admitting: Adult Health

## 2014-05-23 ENCOUNTER — Ambulatory Visit (INDEPENDENT_AMBULATORY_CARE_PROVIDER_SITE_OTHER): Payer: PRIVATE HEALTH INSURANCE | Admitting: Adult Health

## 2014-05-23 ENCOUNTER — Ambulatory Visit (INDEPENDENT_AMBULATORY_CARE_PROVIDER_SITE_OTHER): Payer: PRIVATE HEALTH INSURANCE

## 2014-05-23 VITALS — BP 140/72 | Ht 64.0 in | Wt 146.5 lb

## 2014-05-23 DIAGNOSIS — R10813 Right lower quadrant abdominal tenderness: Secondary | ICD-10-CM

## 2014-05-23 NOTE — Patient Instructions (Signed)
Ultrasound normal Follow up prn

## 2014-05-23 NOTE — Progress Notes (Signed)
Subjective:     Patient ID: DALASIA PREDMORE, female   DOB: 02/20/1933, 80 y.o.   MRN: [id number]  HPI Villa is a 80 year old black female in for [place] for RLQ tenderness.  Review of Systems See HPI Reviewed past medical,surgical, social and family history. Reviewed medications and allergies.     Objective:   Physical Exam BP 140/72  Ht 5\' 4"  (1.626 m)  Wt 146 lb 8 oz (66.452 kg)  BMI 25.13 kg/m2Reviewed [place] with pt.   Uterus Surgically Absent Vaginal Cuff appears WNL  Right ovary 1.5 x 0.9 x 1.0 cm,  Left ovary 1.7 x 1.0 x 1.0 cm,  No free fluid or adnexal masses noted within the pelvis  Technician Comments:  Vaginal cuff appears WNL, Bilateral ovaries/adnexa appears WNL, No free fluid or adnexal masses noted within the pelvis.  Discussed could be bowels.     Assessment:     RLQ tenderness    Plan:     Follow up prn

## 2014-06-08 ENCOUNTER — Other Ambulatory Visit (HOSPITAL_COMMUNITY): Payer: Self-pay | Admitting: General Surgery

## 2014-06-08 DIAGNOSIS — Z1231 Encounter for screening mammogram for malignant neoplasm of breast: Secondary | ICD-10-CM

## 2014-07-09 ENCOUNTER — Ambulatory Visit (HOSPITAL_COMMUNITY)
Admission: RE | Admit: 2014-07-09 | Discharge: 2014-07-09 | Disposition: A | Discharge status: Home / Self Care | Payer: PRIVATE HEALTH INSURANCE | Source: Ambulatory Visit | Attending: General Surgery | Admitting: General Surgery

## 2014-07-09 DIAGNOSIS — Z1231 Encounter for screening mammogram for malignant neoplasm of breast: Secondary | ICD-10-CM | POA: Diagnosis present

## 2014-09-10 ENCOUNTER — Encounter: Payer: Self-pay | Admitting: Adult Health

## 2014-10-26 ENCOUNTER — Ambulatory Visit (HOSPITAL_COMMUNITY)
Admission: RE | Admit: 2014-10-26 | Discharge: 2014-10-26 | Disposition: A | Discharge status: Home / Self Care | Payer: PRIVATE HEALTH INSURANCE | Source: Ambulatory Visit | Attending: Pulmonary Disease | Admitting: Pulmonary Disease

## 2014-10-26 ENCOUNTER — Other Ambulatory Visit (HOSPITAL_COMMUNITY): Payer: Self-pay | Admitting: Pulmonary Disease

## 2014-10-26 DIAGNOSIS — M545 Low back pain: Secondary | ICD-10-CM | POA: Insufficient documentation

## 2014-10-26 DIAGNOSIS — M5136 Other intervertebral disc degeneration, lumbar region: Secondary | ICD-10-CM | POA: Diagnosis not present

## 2014-10-26 DIAGNOSIS — M4686 Other specified inflammatory spondylopathies, lumbar region: Secondary | ICD-10-CM | POA: Insufficient documentation

## 2014-10-26 DIAGNOSIS — M4806 Spinal stenosis, lumbar region: Secondary | ICD-10-CM | POA: Insufficient documentation

## 2014-11-22 ENCOUNTER — Ambulatory Visit (HOSPITAL_COMMUNITY): Payer: Medicaid Other | Admitting: Physical Therapy

## 2014-11-29 ENCOUNTER — Ambulatory Visit (HOSPITAL_COMMUNITY): Payer: Medicaid Other | Admitting: Physical Therapy

## 2014-12-25 DIAGNOSIS — N2889 Other specified disorders of kidney and ureter: Secondary | ICD-10-CM | POA: Diagnosis not present

## 2014-12-25 DIAGNOSIS — C642 Malignant neoplasm of left kidney, except renal pelvis: Secondary | ICD-10-CM | POA: Diagnosis not present

## 2014-12-25 DIAGNOSIS — Z9889 Other specified postprocedural states: Secondary | ICD-10-CM | POA: Diagnosis not present

## 2014-12-25 DIAGNOSIS — N281 Cyst of kidney, acquired: Secondary | ICD-10-CM | POA: Diagnosis not present

## 2015-01-02 DIAGNOSIS — C649 Malignant neoplasm of unspecified kidney, except renal pelvis: Secondary | ICD-10-CM | POA: Diagnosis not present

## 2015-01-02 DIAGNOSIS — I10 Essential (primary) hypertension: Secondary | ICD-10-CM | POA: Diagnosis not present

## 2015-01-02 DIAGNOSIS — M797 Fibromyalgia: Secondary | ICD-10-CM | POA: Diagnosis not present

## 2015-01-02 DIAGNOSIS — L509 Urticaria, unspecified: Secondary | ICD-10-CM | POA: Diagnosis not present

## 2015-01-27 ENCOUNTER — Emergency Department (HOSPITAL_COMMUNITY): Payer: Medicare Other

## 2015-01-27 ENCOUNTER — Inpatient Hospital Stay (HOSPITAL_COMMUNITY)
Admission: EM | Admit: 2015-01-27 | Discharge: 2015-01-29 | DRG: 069 | Disposition: A | Discharge status: Home / Self Care | Payer: Medicare Other | Attending: Neurology | Admitting: Neurology

## 2015-01-27 ENCOUNTER — Encounter (HOSPITAL_COMMUNITY): Payer: Self-pay

## 2015-01-27 DIAGNOSIS — I639 Cerebral infarction, unspecified: Secondary | ICD-10-CM | POA: Diagnosis not present

## 2015-01-27 DIAGNOSIS — Z90710 Acquired absence of both cervix and uterus: Secondary | ICD-10-CM

## 2015-01-27 DIAGNOSIS — G459 Transient cerebral ischemic attack, unspecified: Principal | ICD-10-CM | POA: Diagnosis present

## 2015-01-27 DIAGNOSIS — Z96649 Presence of unspecified artificial hip joint: Secondary | ICD-10-CM | POA: Diagnosis present

## 2015-01-27 DIAGNOSIS — G8194 Hemiplegia, unspecified affecting left nondominant side: Secondary | ICD-10-CM | POA: Diagnosis present

## 2015-01-27 DIAGNOSIS — E039 Hypothyroidism, unspecified: Secondary | ICD-10-CM | POA: Diagnosis present

## 2015-01-27 DIAGNOSIS — G451 Carotid artery syndrome (hemispheric): Secondary | ICD-10-CM

## 2015-01-27 DIAGNOSIS — E785 Hyperlipidemia, unspecified: Secondary | ICD-10-CM | POA: Diagnosis not present

## 2015-01-27 DIAGNOSIS — I6789 Other cerebrovascular disease: Secondary | ICD-10-CM | POA: Diagnosis not present

## 2015-01-27 DIAGNOSIS — M797 Fibromyalgia: Secondary | ICD-10-CM | POA: Diagnosis not present

## 2015-01-27 DIAGNOSIS — R200 Anesthesia of skin: Secondary | ICD-10-CM | POA: Diagnosis not present

## 2015-01-27 DIAGNOSIS — I10 Essential (primary) hypertension: Secondary | ICD-10-CM | POA: Diagnosis not present

## 2015-01-27 DIAGNOSIS — K219 Gastro-esophageal reflux disease without esophagitis: Secondary | ICD-10-CM | POA: Diagnosis not present

## 2015-01-27 DIAGNOSIS — R531 Weakness: Secondary | ICD-10-CM | POA: Diagnosis not present

## 2015-01-27 DIAGNOSIS — Z87891 Personal history of nicotine dependence: Secondary | ICD-10-CM | POA: Diagnosis not present

## 2015-01-27 DIAGNOSIS — Z823 Family history of stroke: Secondary | ICD-10-CM | POA: Diagnosis not present

## 2015-01-27 DIAGNOSIS — I6611 Occlusion and stenosis of right anterior cerebral artery: Secondary | ICD-10-CM | POA: Diagnosis not present

## 2015-01-27 DIAGNOSIS — R0602 Shortness of breath: Secondary | ICD-10-CM | POA: Diagnosis not present

## 2015-01-27 HISTORY — DX: Hyperlipidemia, unspecified: E78.5

## 2015-01-27 HISTORY — DX: Transient cerebral ischemic attack, unspecified: G45.9

## 2015-01-27 LAB — I-STAT CHEM 8, ED
BUN: 17 mg/dL (ref 6–23)
Calcium, Ion: 1.21 mmol/L (ref 1.13–1.30)
Chloride: 101 mmol/L (ref 96–112)
Creatinine, Ser: 1.1 mg/dL (ref 0.50–1.10)
Glucose, Bld: 187 mg/dL — ABNORMAL HIGH (ref 70–99)
HCT: 44 % (ref 36.0–46.0)
Hemoglobin: 15 g/dL (ref 12.0–15.0)
Potassium: 3.6 mmol/L (ref 3.5–5.1)
SODIUM: 142 mmol/L (ref 135–145)
TCO2: 23 mmol/L (ref 0–100)

## 2015-01-27 LAB — URINALYSIS, ROUTINE W REFLEX MICROSCOPIC
BILIRUBIN URINE: NEGATIVE
GLUCOSE, UA: NEGATIVE mg/dL
Hgb urine dipstick: NEGATIVE
KETONES UR: NEGATIVE mg/dL
LEUKOCYTES UA: NEGATIVE
Nitrite: NEGATIVE
PH: 7.5 (ref 5.0–8.0)
PROTEIN: NEGATIVE mg/dL
Specific Gravity, Urine: 1.008 (ref 1.005–1.030)
Urobilinogen, UA: 0.2 mg/dL (ref 0.0–1.0)

## 2015-01-27 LAB — COMPREHENSIVE METABOLIC PANEL
ALK PHOS: 73 U/L (ref 39–117)
ALT: 18 U/L (ref 0–35)
ANION GAP: 8 (ref 5–15)
AST: 25 U/L (ref 0–37)
Albumin: 3.7 g/dL (ref 3.5–5.2)
BILIRUBIN TOTAL: 0.5 mg/dL (ref 0.3–1.2)
BUN: 18 mg/dL (ref 6–23)
CO2: 28 mmol/L (ref 19–32)
Calcium: 9.2 mg/dL (ref 8.4–10.5)
Chloride: 104 mmol/L (ref 96–112)
Creatinine, Ser: 1.08 mg/dL (ref 0.50–1.10)
GFR, EST AFRICAN AMERICAN: 55 mL/min — AB (ref >90)
GFR, EST NON AFRICAN AMERICAN: 48 mL/min — AB (ref >90)
Glucose, Bld: 191 mg/dL — ABNORMAL HIGH (ref 70–99)
Potassium: 3.7 mmol/L (ref 3.5–5.1)
Sodium: 140 mmol/L (ref 135–145)
Total Protein: 6.7 g/dL (ref 6.0–8.3)

## 2015-01-27 LAB — DIFFERENTIAL
BASOS ABS: 0 10*3/uL (ref 0.0–0.1)
BASOS PCT: 1 % (ref 0–1)
EOS ABS: 0.3 10*3/uL (ref 0.0–0.7)
Eosinophils Relative: 4 % (ref 0–5)
Lymphocytes Relative: 46 % (ref 12–46)
Lymphs Abs: 2.8 10*3/uL (ref 0.7–4.0)
MONO ABS: 0.4 10*3/uL (ref 0.1–1.0)
Monocytes Relative: 7 % (ref 3–12)
NEUTROS PCT: 42 % — AB (ref 43–77)
Neutro Abs: 2.6 10*3/uL (ref 1.7–7.7)

## 2015-01-27 LAB — RAPID URINE DRUG SCREEN, HOSP PERFORMED
Amphetamines: NOT DETECTED
Barbiturates: NOT DETECTED
Benzodiazepines: NOT DETECTED
Cocaine: NOT DETECTED
Opiates: NOT DETECTED
Tetrahydrocannabinol: NOT DETECTED

## 2015-01-27 LAB — ETHANOL: Alcohol, Ethyl (B): <5 mg/dL (ref 0–9)

## 2015-01-27 LAB — CBC
HCT: 42.2 % (ref 36.0–46.0)
HEMOGLOBIN: 13.7 g/dL (ref 12.0–15.0)
MCH: 29.9 pg (ref 26.0–34.0)
MCHC: 32.5 g/dL (ref 30.0–36.0)
MCV: 92.1 fL (ref 78.0–100.0)
Platelets: 316 10*3/uL (ref 150–400)
RBC: 4.58 MIL/uL (ref 3.87–5.11)
RDW: 13.8 % (ref 11.5–15.5)
WBC: 6.1 10*3/uL (ref 4.0–10.5)

## 2015-01-27 LAB — PROTIME-INR
INR: 0.97 (ref 0.00–1.49)
Prothrombin Time: 13 seconds (ref 11.6–15.2)

## 2015-01-27 LAB — MRSA PCR SCREENING: MRSA BY PCR: NEGATIVE

## 2015-01-27 LAB — I-STAT TROPONIN, ED: Troponin i, poc: 0.01 ng/mL (ref 0.00–0.08)

## 2015-01-27 LAB — APTT: aPTT: 31 seconds (ref 24–37)

## 2015-01-27 MED ORDER — SENNOSIDES-DOCUSATE SODIUM 8.6-50 MG PO TABS
1.0000 | ORAL_TABLET | Freq: Every evening | ORAL | Status: DC | PRN
  Filled 2015-01-27: qty 1

## 2015-01-27 MED ORDER — SODIUM CHLORIDE 0.9 % IV SOLN
INTRAVENOUS | Status: DC
  Administered 2015-01-27: 18:00:00 via INTRAVENOUS

## 2015-01-27 MED ORDER — PREGABALIN 50 MG PO CAPS
50.0000 mg | ORAL_CAPSULE | Freq: Two times a day (BID) | ORAL | Status: DC
  Administered 2015-01-27 – 2015-01-29 (×4): 50 mg via ORAL
  Filled 2015-01-27 (×4): qty 1

## 2015-01-27 MED ORDER — STROKE: EARLY STAGES OF RECOVERY BOOK
Freq: Once | Status: DC
  Filled 2015-01-27: qty 1

## 2015-01-27 MED ORDER — SODIUM CHLORIDE 0.9 % IV SOLN
50.0000 mL | Freq: Once | INTRAVENOUS | Status: AC
  Administered 2015-01-27: 50 mL via INTRAVENOUS

## 2015-01-27 MED ORDER — ACETAMINOPHEN 650 MG RE SUPP: 650.0000 mg | RECTAL | Status: DC | PRN

## 2015-01-27 MED ORDER — ACETAMINOPHEN 325 MG PO TABS
650.0000 mg | ORAL_TABLET | ORAL | Status: DC | PRN
  Administered 2015-01-28: 650 mg via ORAL
  Filled 2015-01-27: qty 2

## 2015-01-27 MED ORDER — LABETALOL HCL 5 MG/ML IV SOLN: 10.0000 mg | INTRAVENOUS | Status: DC | PRN

## 2015-01-27 MED ORDER — ALTEPLASE 100 MG IV SOLR
INTRAVENOUS | Status: AC
  Filled 2015-01-27: qty 1

## 2015-01-27 MED ORDER — LEVOTHYROXINE SODIUM 100 MCG PO TABS
100.0000 ug | ORAL_TABLET | Freq: Every day | ORAL | Status: DC
  Administered 2015-01-28 – 2015-01-29 (×2): 100 ug via ORAL
  Filled 2015-01-27 (×3): qty 1

## 2015-01-27 MED ORDER — MIRTAZAPINE 7.5 MG PO TABS
7.5000 mg | ORAL_TABLET | Freq: Every day | ORAL | Status: DC
  Administered 2015-01-27 – 2015-01-28 (×2): 7.5 mg via ORAL
  Filled 2015-01-27 (×3): qty 1

## 2015-01-27 MED ORDER — ALTEPLASE (STROKE) FULL DOSE INFUSION
0.9000 mg/kg | Freq: Once | INTRAVENOUS | Status: AC
  Administered 2015-01-27: 58.8 mg via INTRAVENOUS

## 2015-01-27 MED ORDER — PANTOPRAZOLE SODIUM 40 MG IV SOLR
40.0000 mg | Freq: Every day | INTRAVENOUS | Status: DC
  Administered 2015-01-27: 40 mg via INTRAVENOUS
  Filled 2015-01-27 (×2): qty 40

## 2015-01-27 NOTE — ED Notes (Signed)
RN accompanied patient for transport to [facility] via [facility]. Patient tolerated transport well, Vital signs remained stable. No changes in mental status during transport. Patient's care handed off to RN on 3 M.

## 2015-01-27 NOTE — H&P (Signed)
Referring Physician: Dr Betsey Holiday    Chief Complaint: Left hemiparesis, left hemisensory deficits, and dysarthria  HPI: Brenda Erickson is a 81 y.o. female with a history of hypertension and fibromyalgia, but no previous history of stroke or TIA who came home from church today and was fixing lunch when she went into the kitchen and suddenly developed left hemiparesis and left hemisensory deficits. She lives alone but has multiple children in the area. She phoned her son to tell him that she was not feeling well and thought she needed to go to the hospital. Both the patient and her son noted that her speech was slurred when she was on the phone. She took (2) 81 mg aspirins before leaving the house.She was taken to [facility] where she was found to have an NIH score of 6. She was given intravenous TPA and transferred to [facility] for further evaluation and treatment. On arrival to the neuro intensive care unit her NIH per nursing was a 5. Her speech deficits had resolved although she had mild residual left hemiparesis and mild left lower extremity numbness. The patient does report that she has had left lower extremity weakness and numbness in the past which apparently is transient. She has seen her primary care physician for this problem but she does not believe there was a diagnosis made.    Date last known well: Date: 01/27/2015 Time last known well: Time: 13:30 tPA Given: Yes  Past Medical History  Diagnosis Date  . Esophageal reflux   . HTN (hypertension)   . Arthritis   . Fibromyalgia   . Hypothyroid   . Hashimoto's disease   . Helicobacter pylori gastritis     in remote past, s/p treatment   . Cancer     mass on left kidney  . RLQ abdominal tenderness 05/16/2014  . Hashimoto's disease     Past Surgical History  Procedure Laterality Date  . Cholecystectomy    . Abdominal hysterectomy      tumor removal; benign  . Total hip arthroplasty    . Colonoscopy   05/22/2003    QVZ:DGLOVF rectum/ Left-sided diverticula.  The remainder of the colonic mucosa and terminal  ileum appeared normal  . Bladder surgery    . Esophagogastroduodenoscopy  05/22/2003    RMR: Tiny distal esophageal erosions consistent with mild erosive reflux esophagitisOtherwise, normal upper gastrointestinal tract through the second part of   the duodenum  . Esophagogastroduodenoscopy  07/22/1992    RMR: Normal EGD  . Colonoscopy  10/05/2012    IEP:PIRJJOA diverticulosis. Tubular adenomas   . Robotic assited partial nephrectomy      left. States was cancerous.     Family History  Problem Relation Age of Onset  . Diabetes Father   . Hypertension    . Cancer      lung  . Colon cancer Neg Hx   . Liver cancer Mother   . Diabetes Sister   . Other Son     backpain  . Other Son     staph in jugular vein  . Stroke Son   . Other Son     moya moya  . Cancer Other     cancer   Her father had a stroke she believes in his 47s. He also had diabetes. Her mother died from cancer.  Social History:  reports that she has quit smoking. Her smoking use included Cigarettes. She has quit using smokeless tobacco. Her smokeless tobacco use included  Snuff. She reports that she drinks alcohol. She reports that she does not use illicit drugs. She quit smoking in 21-Feb-1967. She was not a heavy smoker. Her husband passed away in 02/20/2013. She now lives alone in [place] but has 4 sons and 3 daughters in the area. She drinks an occasional glass of wine. 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 Codeine Nausea And Vomiting    Medications:  Scheduled: . sodium chloride  50 mL Intravenous Once  . alteplase        ROS: History obtained from the patient  General ROS: negative for - chills, fatigue, fever, night sweats, weight gain or weight loss Psychological ROS: negative for - behavioral disorder, hallucinations, memory difficulties,  mood swings or suicidal ideation Ophthalmic ROS: negative for - blurry vision, double vision, eye pain or loss of vision ENT ROS: negative for - epistaxis, nasal discharge, oral lesions, sore throat, tinnitus or vertigo Allergy and Immunology ROS: negative for - hives or itchy/watery eyes Hematological and Lymphatic ROS: negative for - bleeding problems, bruising or swollen lymph nodes Endocrine ROS: negative for - galactorrhea, hair pattern changes, polydipsia/polyuria or temperature intolerance Respiratory ROS: negative for - cough, hemoptysis, or wheezing. The patient felt SOB today during this episode but it may have been due to anxiety. Cardiovascular ROS: negative for - chest pain, dyspnea on exertion, edema or irregular heartbeat Gastrointestinal ROS: negative for - abdominal pain, diarrhea, hematemesis, nausea/vomiting or stool incontinence. Positive for occasional constipation. Genito-Urinary ROS: negative for - dysuria, hematuria, incontinence or urinary frequency/urgency Musculoskeletal ROS: negative for - joint swelling or muscular weakness. Positive for arthritis in her back and hips. Positive for occasional weakness and numbness of her left lower extremity. She ambulates with a cane and admits to having 4 or 5 falls within her home but no apparent injuries. Neurological ROS: as noted in HPI Dermatological ROS: negative for rash and skin lesion changes   Physical Examination: Blood pressure 132/63, pulse 67, temperature 98 F (36.7 C), temperature source Oral, resp. rate 12, height 5\' 4"  (1.626 m), weight 72.576 kg (160 lb), SpO2 100 %.  General - pleasant alert and oriented 81 year old female in no acute distress Heart - Regular rate and rhythm - no murmer Lungs - Clear to auscultation Abdomen - Soft - non tender Extremities - Distal pulses weak to absent - no edema Skin - Warm and dry   Mental Status: Alert, oriented, thought content appropriate.  Speech fluent without  evidence of aphasia.  Able to follow 3 step commands without difficulty. Cranial Nerves: II: Discs not visualized; Visual fields grossly normal, pupils equal, round, reactive to light and accommodation III,IV, VI: ptosis not present, extra-ocular motions intact bilaterally V,VII: smile symmetric, facial light touch sensation normal bilaterally VIII: hearing normal bilaterally IX,X: gag reflex present XI: bilateral shoulder shrug XII: midline tongue extension Motor: Right : Upper extremity   5/5    Left:     Upper extremity   4/5  Lower extremity   5/5     Lower extremity   4/5 Tone and bulk:normal tone throughout; no atrophy noted Sensory: Decreased sensation from the knee down on the left. Sensation intact to light touch in all other areas. Deep Tendon Reflexes: 2+ and symmetric throughout Plantars: Right: downgoing   Left: downgoing Cerebellar: normal finger-to-nose, normal rapid alternating movements. Gait: Deferred CV: Decreased pulses in both lower extremities.    Laboratory Studies:  Basic  Metabolic Panel:  Recent Labs Lab 01/27/15 1414 01/27/15 1418  NA 140 142  K 3.7 3.6  CL 104 101  CO2 28  --   GLUCOSE 191* 187*  BUN 18 17  CREATININE 1.08 1.10  CALCIUM 9.2  --     Liver Function Tests:  Recent Labs Lab 01/27/15 1414  AST 25  ALT 18  ALKPHOS 73  BILITOT 0.5  PROT 6.7  ALBUMIN 3.7   No results for input(s): LIPASE, AMYLASE in the last 168 hours. No results for input(s): AMMONIA in the last 168 hours.  CBC:  Recent Labs Lab 01/27/15 1414 01/27/15 1418  WBC 6.1  --   NEUTROABS 2.6  --   HGB 13.7 15.0  HCT 42.2 44.0  MCV 92.1  --   PLT 316  --     Cardiac Enzymes: No results for input(s): CKTOTAL, CKMB, CKMBINDEX, TROPONINI in the last 168 hours.  BNP: Invalid input(s): POCBNP  CBG: No results for input(s): GLUCAP in the last 168 hours.  Microbiology: Results for orders placed or performed during the hospital encounter of 03/17/13   Urine culture     Status: None   Collection Time: 03/17/13  2:15 PM  Result Value Ref Range Status   Specimen Description URINE, CLEAN CATCH  Final   Special Requests NONE  Final   Culture  Setup Time 03/18/2013 03:22  Final   Colony Count 15,000 COLONIES/ML  Final   Culture   Final    Multiple bacterial morphotypes present, none predominant. Suggest appropriate recollection if clinically indicated.   Report Status 03/19/2013 FINAL  Final    Coagulation Studies:  Recent Labs  01/27/15 1414  LABPROT 13.0  INR 0.97    Urinalysis: No results for input(s): COLORURINE, LABSPEC, PHURINE, GLUCOSEU, HGBUR, BILIRUBINUR, KETONESUR, PROTEINUR, UROBILINOGEN, NITRITE, LEUKOCYTESUR in the last 168 hours.  Invalid input(s): APPERANCEUR  Lipid Panel: No results found for: CHOL, TRIG, HDL, CHOLHDL, VLDL, LDLCALC  HgbA1C: No results found for: HGBA1C  Urine Drug Screen:  No results found for: LABOPIA, COCAINSCRNUR, LABBENZ, AMPHETMU, THCU, LABBARB  Alcohol Level:  Recent Labs Lab 01/27/15 [place] <5    Other results: EKG: SR rate 76 BPM. Please refer to formal cardiology reading for complete details.  Imaging:  Ct Head Wo Contrast 01/27/2015    No evidence of acute intracranial abnormality.  Chronic small-vessel white matter ischemic changes.  Chronic right mastoid effusion.      Assessment: 81 y.o. female with a history of hypertension followed by her primary care physician Dr. Luan Pulling in [place] who developed acute onset of left hemiparesis, left hemisensory deficits, and slurred speech at approximately 13:30 today after eating lunch. Her family brought her to [address] where her NIH was a 6. She received TPA at 15:15  and was transferred to [facility]. On arrival she was already showing improvement. Her NIH in the neuro intensive care unit was a 5 per nursing. Speech deficits resolved. Left hemiparesis is mild.  Stroke Risk Factors - family history and  hypertension  Plan: 1. HgbA1c, fasting lipid panel 2. MRI, MRA  of the brain without contrast 3. PT consult, OT consult, Speech consult 4. Echocardiogram 5. Carotid dopplers 6. Prophylactic therapy-Antiplatelet med: Aspirin - dose 325 mg daily 24 hours after TPA if repeat imaging negative for bleed. 7. NPO until RN stroke swallow screen 8. Telemetry monitoring 9. Frequent neuro checks   Lowry Ram [facility] Pager [phone] 01/27/2015, 4:19  PM   Spoke with patient and family members regarding the patient's condition and the plans for further testing to ascertain etiology of stroke and evaluate further risk factors. All questions were answered. Patient stable and resting comfortably.  Jim Like, DO [facility] [phone]  If 7pm- 7am, please page neurology on call as listed in [place].

## 2015-01-27 NOTE — ED Notes (Signed)
Dr. Betsey Holiday at bedside.  NIH stroke scale completed and a number 6 given.  Pt has left sided arm and left leg numbness and weakness with some drift.  Pt no distress and she states she feels like her numbness and weakness is improving.

## 2015-01-27 NOTE — ED Notes (Signed)
neurocheck completed .  Pt states she feels like the numbness is improving.  Pt still has drift to left leg and left arm.  No change in strength.

## 2015-01-27 NOTE — ED Notes (Signed)
Neuro check remains the same.  [facility] to [facility].  TPA infusion being given.

## 2015-01-27 NOTE — ED Provider Notes (Signed)
CSN: [id number]     Arrival date & time 01/27/15  1352 History  This chart was scribed for Brenda Greek, MD by Stephania Fragmin, ED Scribe. This patient was seen in room APA01/APA01 and the patient's care was started at 1:57 PM.    Chief Complaint  Patient presents with  . Code Stroke   The history is provided by the patient. No language interpreter was used.     HPI Comments: Brenda Erickson is a 81 y.o. female who presents to the Emergency Department complaining of sudden onset left sided numbness and weakness that began 30 minutes ago. She states she got up this morning and went to church feeling fine, and had just finished lunch when her symptoms onset very suddenly. Patient states she also had slurred speech and SOB. She states that her left sided face and tongue are currently numb and weak, with an accompanying burning sensation.  Past Medical History  Diagnosis Date  . Esophageal reflux   . HTN (hypertension)   . Arthritis   . Fibromyalgia   . Hypothyroid   . Hashimoto's disease   . Helicobacter pylori gastritis     in remote past, s/p treatment   . Cancer     mass on left kidney  . RLQ abdominal tenderness 05/16/2014  . Hashimoto's disease    Past Surgical History  Procedure Laterality Date  . Cholecystectomy    . Abdominal hysterectomy      tumor removal; benign  . Total hip arthroplasty    . Colonoscopy  05/22/2003    XKG:YJEHUD rectum/ Left-sided diverticula.  The remainder of the colonic mucosa and terminal  ileum appeared normal  . Bladder surgery    . Esophagogastroduodenoscopy  05/22/2003    RMR: Tiny distal esophageal erosions consistent with mild erosive reflux esophagitisOtherwise, normal upper gastrointestinal tract through the second part of   the duodenum  . Esophagogastroduodenoscopy  07/22/1992    RMR: Normal EGD  . Colonoscopy  10/05/2012    JSH:FWYOVZC diverticulosis. Tubular adenomas   . Robotic assited partial nephrectomy      left. States was  cancerous.    Family History  Problem Relation Age of Onset  . Diabetes Father   . Hypertension    . Cancer      lung  . Colon cancer Neg Hx   . Liver cancer Mother   . Diabetes Sister   . Other Son     backpain  . Other Son     staph in jugular vein  . Stroke Son   . Other Son     moya moya  . Cancer Other     cancer   History  Substance Use Topics  . Smoking status: Former Smoker    Types: Cigarettes  . Smokeless tobacco: Former Systems developer    Types: Snuff  . Alcohol Use: Yes     Comment: wine every now and then   OB History    Gravida Para Term Preterm AB TAB SAB Ectopic Multiple Living   11 7   4  4   7      Review of Systems  Respiratory: Positive for shortness of breath.   Neurological: Positive for speech difficulty, weakness and numbness.  All other systems reviewed and are negative.     Allergies  Codeine  Home Medications   Prior to Admission medications   Medication Sig Start Date End Date Taking? Authorizing Provider  Alum & Mag Hydroxide-Simeth (ANTACID LIQUID PO) Take by  mouth daily.    Historical Provider, MD  amLODipine (NORVASC) 10 MG tablet Take 5 mg by mouth daily.     Historical Provider, MD  calcium-vitamin D (OSCAL WITH D) 250-125 MG-UNIT per tablet Take 1 tablet by mouth daily.    Historical Provider, MD  cephALEXin (KEFLEX) 500 MG capsule Take 1 capsule (500 mg total) by mouth 4 (four) times daily. 03/17/13   Virgel Manifold, MD  cloNIDine (CATAPRES) 0.1 MG tablet Take 0.1 mg by mouth at bedtime.    Historical Provider, MD  fish oil-omega-3 fatty acids 1000 MG capsule Take 2 g by mouth daily.    Historical Provider, MD  furosemide (LASIX) 40 MG tablet Take 40 mg by mouth daily.    Historical Provider, MD  HYDROcodone-acetaminophen (NORCO/VICODIN) 5-325 MG per tablet Take 1 tablet by mouth every 6 (six) hours as needed.  07/05/13   Historical Provider, MD  ibandronate (BONIVA) 150 MG tablet Take 150 mg by mouth every 30 (thirty) days. Take in the  morning with a full glass of water, on an empty stomach, and do not take anything else by mouth or lie down for the next 30 min.    Historical Provider, MD  IRON PO Take 65 mg by mouth daily.    Historical Provider, MD  levothyroxine (SYNTHROID, LEVOTHROID) 100 MCG tablet Take 100 mcg by mouth daily.    Historical Provider, MD  metoprolol succinate (TOPROL-XL) 100 MG 24 hr tablet Take 100 mg by mouth daily. Take with or immediately following a meal.    Historical Provider, MD  mirtazapine (REMERON) 15 MG tablet Take 7.5 mg by mouth at bedtime.  07/13/13   Historical Provider, MD  omeprazole (PRILOSEC) 20 MG capsule Take 20 mg by mouth 2 (two) times daily.    Historical Provider, MD  pregabalin (LYRICA) 50 MG capsule Take 50 mg by mouth 2 (two) times daily.    Historical Provider, MD  Probiotic Product (PROBIOTIC DAILY PO) Take by mouth daily.    Historical Provider, MD  sucralfate (CARAFATE) 1 G tablet Take 1 g by mouth 4 (four) times daily.    Historical Provider, MD  tiZANidine (ZANAFLEX) 4 MG tablet Take 4 mg by mouth 2 (two) times daily.    Historical Provider, MD  torsemide (DEMADEX) 20 MG tablet Take 20 mg by mouth daily.  07/05/13   Historical Provider, MD   BP 143/74 mmHg  Pulse 67  Temp(Src) 98 F (36.7 C) (Oral)  Resp 14  Ht 5\' 4"  (1.626 m)  Wt 160 lb (72.576 kg)  BMI 27.45 kg/m2  SpO2 100% Physical Exam  Constitutional: She is oriented to person, place, and time. She appears well-developed and well-nourished. No distress.  HENT:  Head: Normocephalic and atraumatic.  Right Ear: Hearing normal.  Left Ear: Hearing normal.  Nose: Nose normal.  Mouth/Throat: Oropharynx is clear and moist and mucous membranes are normal.  Eyes: Conjunctivae and EOM are normal. Pupils are equal, round, and reactive to light.  Neck: Normal range of motion. Neck supple.  Cardiovascular: Regular rhythm, S1 normal and S2 normal.  Exam reveals no gallop and no friction rub.   No murmur  heard. Pulmonary/Chest: Effort normal and breath sounds normal. No respiratory distress. She exhibits no tenderness.  Abdominal: Soft. Normal appearance and bowel sounds are normal. There is no hepatosplenomegaly. There is no tenderness. There is no rebound, no guarding, no tenderness at McBurney's point and negative Murphy's sign. No hernia.  Musculoskeletal: Normal range of motion.  Neurological: She is alert and oriented to person, place, and time. She has normal strength. No cranial nerve deficit or sensory deficit. Coordination normal. GCS eye subscore is 4. GCS verbal subscore is 5. GCS motor subscore is 6.  Extraocular muscle movement: normal No visual field cut Pupils: equal and reactive both direct and consensual response is normal No nystagmus present    decreased pinprick left arm and leg  Decreased grip str LUE +pronator drift LUE Ataxia with finger to nose LUE  Unable to lift left leg against gravity,  Cannot perform heel to shim left  NIH STROKE SCORE = 6  Skin: Skin is warm, dry and intact. No rash noted. No cyanosis.  Psychiatric: She has a normal mood and affect. Her speech is normal and behavior is normal. Thought content normal.  Nursing note and vitals reviewed.   ED Course  Procedures (including critical care time)  COORDINATION OF CARE: 2:03 PM - Discussed treatment plan with pt at bedside which includes CT scan and possible blood thinning medication, and pt agreed to plan.   Labs Review Labs Reviewed  DIFFERENTIAL - Abnormal; Notable for the following:    Neutrophils Relative % 42 (*)    All other components within normal limits  COMPREHENSIVE METABOLIC PANEL - Abnormal; Notable for the following:    Glucose, Bld 191 (*)    GFR calc non Af Amer 48 (*)    GFR calc Af Amer 55 (*)    All other components within normal limits  I-STAT CHEM 8, ED - Abnormal; Notable for the following:    Glucose, Bld 187 (*)    All other components within normal limits   ETHANOL  PROTIME-INR  APTT  CBC  URINE RAPID DRUG SCREEN (HOSP PERFORMED)  URINALYSIS, ROUTINE W REFLEX MICROSCOPIC  I-STAT TROPOININ, ED  I-STAT TROPOININ, ED    Imaging Review Ct Head Wo Contrast  01/27/2015   CLINICAL DATA:  81 year old female with acute left-sided numbness, weakness and slurred speech today. Code stroke.  EXAM: CT HEAD WITHOUT CONTRAST  TECHNIQUE: Contiguous axial images were obtained from the base of the skull through the vertex without intravenous contrast.  COMPARISON:  06/02/2010 CT  FINDINGS: Chronic small-vessel white matter ischemic changes are again noted.  No acute intracranial abnormalities are identified, including mass lesion or mass effect, hydrocephalus, extra-axial fluid collection, midline shift, hemorrhage, or acute infarction.  The visualized bony calvarium is unremarkable.  A chronic right mastoid effusion is noted.  IMPRESSION: No evidence of acute intracranial abnormality.  Chronic small-vessel white matter ischemic changes.  Chronic right mastoid effusion.  Critical Value/emergent results were called by telephone at the time of interpretation on 01/27/2015 at 2:22 pm to Dr. Vanita Panda , who verbally acknowledged these results.   Electronically Signed   By: Margarette Canada M.D.   On: 01/27/2015 14:22      EKG Interpretation None      MDM   Final diagnoses:  Acute CVA (cerebrovascular accident)   Patient presents to the ER for evaluation of acute onset of left-sided numbness, tingling and weakness. She reports that the symptoms began at 1:25 PM. Examination revealed left-sided deficit. My calculation of NIH stroke score is 6. Code stroke was instituted. Patient was evaluated in conjunction with tele-neurology. TPA was recommended.  I did have extensive conversation with the patient and her daughters prior to recommendation of TPA. 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 She understands the risks of bleeding. She did agree  to  treatment with TPA after full disclosure of risks.  Case was discussed briefly with Dr. Janann Colonel, on call for neurology at [facility]. Patient will be transferred to [facility] under the care of the stroke team in the neuro ICU.   CRITICAL CARE Performed by: Brenda Erickson   Total critical care time: 74min  Critical care time was exclusive of separately billable procedures and treating other patients.  Critical care was necessary to treat or prevent imminent or life-threatening deterioration.  Critical care was time spent personally by me on the following activities: development of treatment plan with patient and/or surrogate as well as nursing, discussions with consultants, evaluation of patient's response to treatment, examination of patient, obtaining history from patient or surrogate, ordering and performing treatments and interventions, ordering and review of laboratory studies, ordering and review of radiographic studies, pulse oximetry and re-evaluation of patient's condition.   I personally performed the services described in this documentation, which was scribed in my presence. The recorded information has been reviewed and is accurate.      Brenda Greek, MD 01/27/15 [phone]

## 2015-01-27 NOTE — ED Notes (Signed)
Pt reports got up and went to church this morning and felt fine.  Reports ate some lunch and at approx 1:25 had sudden onset of left sided numbness.  Pt says the numbness is better but still weak.  Denies any pain.  EDP aware and Dr. Betsey Holiday at bedside.

## 2015-01-28 ENCOUNTER — Inpatient Hospital Stay (HOSPITAL_COMMUNITY): Payer: Medicare Other

## 2015-01-28 DIAGNOSIS — I6789 Other cerebrovascular disease: Secondary | ICD-10-CM

## 2015-01-28 LAB — LIPID PANEL
CHOL/HDL RATIO: 5.1 ratio
Cholesterol: 273 mg/dL — ABNORMAL HIGH (ref 0–200)
HDL: 54 mg/dL (ref >39)
LDL CALC: 160 mg/dL — AB (ref 0–99)
Triglycerides: 294 mg/dL — ABNORMAL HIGH (ref <150)
VLDL: 59 mg/dL — ABNORMAL HIGH (ref 0–40)

## 2015-01-28 MED ORDER — ASPIRIN 325 MG PO TABS
325.0000 mg | ORAL_TABLET | Freq: Every day | ORAL | Status: DC
  Administered 2015-01-28 – 2015-01-29 (×2): 325 mg via ORAL
  Filled 2015-01-28 (×2): qty 1

## 2015-01-28 MED ORDER — PANTOPRAZOLE SODIUM 40 MG PO TBEC
40.0000 mg | DELAYED_RELEASE_TABLET | Freq: Every day | ORAL | Status: DC
  Administered 2015-01-28: 40 mg via ORAL

## 2015-01-28 NOTE — Progress Notes (Signed)
PT Cancellation Note  Patient Details Name: Brenda Erickson MRN: [id number] DOB: Dec 27, 1932   Cancelled Treatment:    Reason Eval/Treat Not Completed: Patient not medically ready.  Pt on strict bedrest post-tpa.  Will hold PT at this time and f/u as appropriate.     Lochlin Eppinger, Thornton Papas 01/28/2015, 8:08 AM

## 2015-01-28 NOTE — Progress Notes (Signed)
STROKE TEAM PROGRESS NOTE   HISTORY Brenda Erickson is a 81 y.o. female with a history of hypertension and fibromyalgia, but no previous history of stroke or TIA who came home from church today and was fixing lunch when she went into the kitchen and suddenly developed left hemiparesis and left hemisensory deficits (LKW 01/27/2015 at 1330). She lives alone but has multiple children in the area. She phoned her son to tell him that she was not feeling well and thought she needed to go to the hospital. Both the patient and her son noted that her speech was slurred when she was on the phone. She took (2) 81 mg aspirins before leaving the house. She was taken to [facility] where she was found to have an NIH score of 6. She was given intravenous TPA and transferred to [facility] for further evaluation and treatment.   On arrival to the neuro intensive care unit her NIH per nursing was a 5. Her speech deficits had resolved although she had mild residual left hemiparesis and mild left lower extremity numbness. The patient does report that she has had left lower extremity weakness and numbness in the past which apparently is transient. She has seen her primary care physician for this problem but she does not believe there was a diagnosis made.    SUBJECTIVE (INTERVAL HISTORY) Her RN and echo tech are at the bedside. No family present Overall she feels her condition is rapidly improving. Her BP is adequately controlled.   OBJECTIVE Temp:  [97.2 F (36.2 C)-98 F (36.7 C)] 97.9 F (36.6 C) (03/21 0800) Pulse Rate:  [54-78] 66 (03/21 0800) Cardiac Rhythm:  [-] Normal sinus rhythm (03/21 0800) Resp:  [8-23] 11 (03/21 0800) BP: (102-176)/(53-116) 143/85 mmHg (03/21 0800) SpO2:  [93 %-100 %] 99 % (03/21 0800) Weight:  [72.576 kg (160 lb)] 72.576 kg (160 lb) (03/20 1414)  No results for input(s): GLUCAP in the last 168 hours.  Recent Labs Lab 01/27/15 1414 01/27/15 1418  NA 140 142   K 3.7 3.6  CL 104 101  CO2 28  --   GLUCOSE 191* 187*  BUN 18 17  CREATININE 1.08 1.10  CALCIUM 9.2  --     Recent Labs Lab 01/27/15 1414  AST 25  ALT 18  ALKPHOS 73  BILITOT 0.5  PROT 6.7  ALBUMIN 3.7    Recent Labs Lab 01/27/15 1414 01/27/15 1418  WBC 6.1  --   NEUTROABS 2.6  --   HGB 13.7 15.0  HCT 42.2 44.0  MCV 92.1  --   PLT 316  --    No results for input(s): CKTOTAL, CKMB, CKMBINDEX, TROPONINI in the last 168 hours.  Recent Labs  01/27/15 1414  LABPROT 13.0  INR 0.97    Recent Labs  01/27/15 2306  COLORURINE YELLOW  LABSPEC 1.008  PHURINE 7.5  GLUCOSEU NEGATIVE  HGBUR NEGATIVE  BILIRUBINUR NEGATIVE  KETONESUR NEGATIVE  PROTEINUR NEGATIVE  UROBILINOGEN 0.2  NITRITE NEGATIVE  LEUKOCYTESUR NEGATIVE       Component Value Date/Time   CHOL 273* 01/28/2015 0321   TRIG 294* 01/28/2015 0321   HDL 54 01/28/2015 0321   CHOLHDL 5.1 01/28/2015 0321   VLDL 59* 01/28/2015 0321   LDLCALC 160* 01/28/2015 0321   No results found for: HGBA1C    Component Value Date/Time   LABOPIA NONE DETECTED 01/27/2015 2306   COCAINSCRNUR NONE DETECTED 01/27/2015 2306   LABBENZ NONE DETECTED 01/27/2015 2306  AMPHETMU NONE DETECTED 01/27/2015 2306   THCU NONE DETECTED 01/27/2015 2306   LABBARB NONE DETECTED 01/27/2015 2306     Recent Labs Lab 01/27/15 1414  ETH <5    Ct Head Wo Contrast  01/27/2015   CLINICAL DATA:  81 year old female with acute left-sided numbness, weakness and slurred speech today. Code stroke.  EXAM: CT HEAD WITHOUT CONTRAST  TECHNIQUE: Contiguous axial images were obtained from the base of the skull through the vertex without intravenous contrast.  COMPARISON:  06/02/2010 CT  FINDINGS: Chronic small-vessel white matter ischemic changes are again noted.  No acute intracranial abnormalities are identified, including mass lesion or mass effect, hydrocephalus, extra-axial fluid collection, midline shift, hemorrhage, or acute infarction.  The  visualized bony calvarium is unremarkable.  A chronic right mastoid effusion is noted.  IMPRESSION: No evidence of acute intracranial abnormality.  Chronic small-vessel white matter ischemic changes.  Chronic right mastoid effusion.  Critical Value/emergent results were called by telephone at the time of interpretation on 01/27/2015 at 2:22 pm to Dr. Vanita Panda , who verbally acknowledged these results.   Electronically Signed   By: Margarette Canada M.D.   On: 01/27/2015 14:22     PHYSICAL EXAM Pleasant elderly lady not in distress. . Afebrile. Head is nontraumatic. Neck is supple without bruit.    Cardiac exam no murmur or gallop. Lungs are clear to auscultation. Distal pulses are well felt. Neurological Exam : Awake alert oriented x 3 normal speech and language. No face asymmetry. Tongue midline. No drift. Mild diminished fine finger movements on left. Orbits right over left upper extremity. Mild left grip weak.. Normal sensation . Normal coordination. ASSESSMENT/PLAN Brenda Erickson is a 81 y.o. female with history of hypertension and fibromyalgia presenting with left hemiparesis, left hemisensory deficits, and dysarthria. She received IV t-PA at 1504 01/27/2015 at [facility] and was transferred to [facility] neuro ICU.   Stroke:  suspect Non-dominant right brain subcortical infarct   Resultant  L foot numbness, decreased FMM. Hemiplegia resolved.  CT head no stroke. small vessel disease . Chronic R mastoid effusion  MRI  pending   MRA  pending   Carotid Doppler  pending   2D Echo  pending   HgbA1c pending  SCDs for VTE prophylaxis  Diet regular thin liquids  no antithrombotic prior to admission, now on no antithrombotics as within 24h of IV tPA  Ongoing aggressive stroke risk factor management  Therapy recommendations:  pending   Disposition:  Pending. Anticipate return home  Hypertension  Home meds:   Norvasc, catapress, toprol  Stable  Hyperlipidemia  Home meds:  Fish oil    LDL 160, goal < 70  Will add statin  Other Stroke Risk Factors  Advanced age  Cigarette smoker, quit smoking 1968  ETOH use  Family hx stroke (son, father in his 37s)  Other Active Problems  Hypothyroid, on [facility] day # Ewa Gentry for Pager information 01/28/2015 10:00 AM  I have personally examined this patient, reviewed notes, independently viewed imaging studies, participated in medical decision making and plan of care. I have made any additions or clarifications directly to the above note. Agree with note above. She presented with right subcortical infarct and is at risk for neurological worseneing, recurrent strokes and TIAs and needs on going close neurological f/u, stroke work up and aggressive risk factor modification. Strict control of BP. Transfer to  stroke unit bed later today.  This patient is critically ill and at significant risk of neurological worsening, death and care requires constant monitoring of vital signs, hemodynamics,respiratory and cardiac monitoring,review of multiple databases, neurological assessment, discussion with family, other specialists and medical decision making of high complexity.I have made any additions or clarifications directly to the above note.  I spent 30 minutes of neurocritical care time  in the care of  this patient.  Antony Contras, MD Medical Director [facility] Pager: [phone] 01/28/2015 6:55 PM   To contact Stroke Continuity provider, please refer to [email or web address] After hours, contact General Neurology

## 2015-01-28 NOTE — Progress Notes (Signed)
*  PRELIMINARY RESULTS* Vascular Ultrasound Carotid Duplex (Doppler) has been completed.  Preliminary findings: Right: 1-39% internal carotid artery stenosis, upper end of scale. Left:  40-59% internal carotid artery stenosis, low end of scale.   Landry Mellow, RDMS, RVT  01/28/2015, 12:13 PM

## 2015-01-28 NOTE — Evaluation (Signed)
Speech Language Pathology Evaluation Patient Details Name: Brenda Erickson MRN: [id number] DOB: Jul 24, 1933 Today's Date: 01/28/2015 Time: 1030-     Problem List:  Patient Active Problem List   Diagnosis Date Noted  . Lacunar infarct, acute   . Acute CVA (cerebrovascular accident) 01/27/2015  . CVA (cerebral infarction) 01/27/2015  . RLQ abdominal tenderness 05/16/2014  . Change in bowel habits 09/21/2012  . Acquired trigger finger 05/25/2012  . TRIGGER FINGER DEFORMITY 06/25/2010  . GOITER 04/22/2009  . HYPOTHYROIDISM 04/22/2009  . HYPERTENSION 04/22/2009  . GERD 04/22/2009  . DIVERTICULOSIS, COLON 04/22/2009  . CONSTIPATION 04/22/2009  . HIP PAIN, RIGHT 04/22/2009  . FIBROMYALGIA 04/22/2009  . EPIGASTRIC PAIN 04/22/2009  . HELICOBACTER PYLORI GASTRITIS, HX OF 04/22/2009  . TRIGGER FINGER 02/11/2009  . HAND PAIN 02/11/2009   Past Medical History:  Past Medical History  Diagnosis Date  . Esophageal reflux   . HTN (hypertension)   . Arthritis   . Fibromyalgia   . Hypothyroid   . Hashimoto's disease   . Helicobacter pylori gastritis     in remote past, s/p treatment   . Cancer     mass on left kidney  . RLQ abdominal tenderness 05/16/2014  . Hashimoto's disease    Past Surgical History:  Past Surgical History  Procedure Laterality Date  . Cholecystectomy    . Abdominal hysterectomy      tumor removal; benign  . Total hip arthroplasty    . Colonoscopy  05/22/2003    YQM:VHQION rectum/ Left-sided diverticula.  The remainder of the colonic mucosa and terminal  ileum appeared normal  . Bladder surgery    . Esophagogastroduodenoscopy  05/22/2003    RMR: Tiny distal esophageal erosions consistent with mild erosive reflux esophagitisOtherwise, normal upper gastrointestinal tract through the second part of   the duodenum  . Esophagogastroduodenoscopy  07/22/1992    RMR: Normal EGD  . Colonoscopy  10/05/2012    GEX:BMWUXLK diverticulosis. Tubular adenomas   . Robotic  assited partial nephrectomy      left. States was cancerous.    HPI:  81 yo female adm. with left sided numbness and weakness, with left facial droop and slurred speech.  Pt took aspirin at home, and called son, who brought her to the hospital.  Pt received TPA.  PMH:  Arthritis, fibromyalgia.  Pt is Jehov[employer]'s Witness.    Assessment / Plan / Recommendation Clinical Impression  Pt with initial dysathria, which now appears to have resolved.  Speech and language appear to be within normal limits.  No ST needed at this time.    SLP Assessment  Patient does not need any further Speech Lanaguage Pathology Services    Follow Up Recommendations       Frequency and Duration        Pertinent Vitals/Pain Pain Assessment: No/denies pain   SLP Goals  Patient/Family Stated Goal: To go home  SLP Evaluation Prior Functioning  Cognitive/Linguistic Baseline: Within functional limits Type of Home: House  Lives With: Alone Available Help at Discharge: Family (7 children, live close) Vocation: Retired   Associate Professor  Overall Cognitive Status: Within Functional Limits for tasks assessed Arousal/Alertness: Awake/alert Orientation Level: Oriented X4 Attention: Divided Divided Attention: Appears intact Memory: Appears intact Awareness: Appears intact Problem Solving: Appears intact Safety/Judgment: Appears intact    Comprehension  Auditory Comprehension Overall Auditory Comprehension: Appears within functional limits for tasks assessed Yes/No Questions: Within Functional Limits Commands: Within Functional Limits Conversation: Complex Visual Recognition/Discrimination Discrimination: Within Function Limits Reading  Comprehension Reading Status: Not tested    Expression Expression Primary Mode of Expression: Verbal Verbal Expression Overall Verbal Expression: Appears within functional limits for tasks assessed Initiation: No impairment Level of Generative/Spontaneous Verbalization:  Conversation Repetition: No impairment Naming: No impairment Pragmatics: No impairment Written Expression Dominant Hand: Right Written Expression: Not tested   Oral / Motor Oral Motor/Sensory Function Overall Oral Motor/Sensory Function: Appears within functional limits for tasks assessed Motor Speech Overall Motor Speech: Appears within functional limits for tasks assessed Respiration: Within functional limits Phonation: Normal Resonance: Within functional limits Articulation: Within functional limitis Intelligibility: Intelligible   GO     Quinn Axe T 01/28/2015, 11:01 AM

## 2015-01-28 NOTE — Progress Notes (Signed)
UR completed.  Jahmya Onofrio, RN BSN MHA CCM Trauma/Neuro ICU Case Manager [phone]  

## 2015-01-28 NOTE — Progress Notes (Signed)
Echocardiogram 2D Echocardiogram has been performed.  Brenda Erickson 01/28/2015, 11:48 AM

## 2015-01-29 ENCOUNTER — Encounter (HOSPITAL_COMMUNITY): Payer: Self-pay | Admitting: Nurse Practitioner

## 2015-01-29 DIAGNOSIS — E785 Hyperlipidemia, unspecified: Secondary | ICD-10-CM

## 2015-01-29 HISTORY — DX: Hyperlipidemia, unspecified: E78.5

## 2015-01-29 LAB — HEMOGLOBIN A1C
Hgb A1c MFr Bld: 6.4 % — ABNORMAL HIGH (ref 4.8–5.6)
MEAN PLASMA GLUCOSE: 137 mg/dL

## 2015-01-29 MED ORDER — ASPIRIN 325 MG PO TABS: 325.0000 mg | ORAL_TABLET | Freq: Every day | ORAL | Status: DC

## 2015-01-29 NOTE — Progress Notes (Signed)
STROKE TEAM PROGRESS NOTE   HISTORY Brenda Erickson is a 81 y.o. female with a history of hypertension and fibromyalgia, but no previous history of stroke or TIA who came home from church today and was fixing lunch when she went into the kitchen and suddenly developed left hemiparesis and left hemisensory deficits (LKW 01/27/2015 at 1330). She lives alone but has multiple children in the area. She phoned her son to tell him that she was not feeling well and thought she needed to go to the hospital. Both the patient and her son noted that her speech was slurred when she was on the phone. She took (2) 81 mg aspirins before leaving the house. She was taken to [facility] where she was found to have an NIH score of 6. She was given intravenous TPA and transferred to [facility] for further evaluation and treatment.   On arrival to the neuro intensive care unit her NIH per nursing was a 5. Her speech deficits had resolved although she had mild residual left hemiparesis and mild left lower extremity numbness. The patient does report that she has had left lower extremity weakness and numbness in the past which apparently is transient. She has seen her primary care physician for this problem but she does not believe there was a diagnosis made.    SUBJECTIVE (INTERVAL HISTORY) Patient up in chair at bedside. Lives alones at home.   OBJECTIVE Temp:  [97.9 F (36.6 C)-98.8 F (37.1 C)] 98 F (36.7 C) (03/22 0800) Pulse Rate:  [60-76] 67 (03/22 0400) Cardiac Rhythm:  [-] Normal sinus rhythm (03/21 2200) Resp:  [8-19] 12 (03/22 0400) BP: (109-155)/(57-107) 111/58 mmHg (03/22 0400) SpO2:  [94 %-100 %] 96 % (03/22 0400)  No results for input(s): GLUCAP in the last 168 hours.  Recent Labs Lab 01/27/15 1414 01/27/15 1418  NA 140 142  K 3.7 3.6  CL 104 101  CO2 28  --   GLUCOSE 191* 187*  BUN 18 17  CREATININE 1.08 1.10  CALCIUM 9.2  --     Recent Labs Lab 01/27/15 1414   AST 25  ALT 18  ALKPHOS 73  BILITOT 0.5  PROT 6.7  ALBUMIN 3.7    Recent Labs Lab 01/27/15 1414 01/27/15 1418  WBC 6.1  --   NEUTROABS 2.6  --   HGB 13.7 15.0  HCT 42.2 44.0  MCV 92.1  --   PLT 316  --    No results for input(s): CKTOTAL, CKMB, CKMBINDEX, TROPONINI in the last 168 hours.  Recent Labs  01/27/15 1414  LABPROT 13.0  INR 0.97    Recent Labs  01/27/15 2306  COLORURINE YELLOW  LABSPEC 1.008  PHURINE 7.5  GLUCOSEU NEGATIVE  HGBUR NEGATIVE  BILIRUBINUR NEGATIVE  KETONESUR NEGATIVE  PROTEINUR NEGATIVE  UROBILINOGEN 0.2  NITRITE NEGATIVE  LEUKOCYTESUR NEGATIVE       Component Value Date/Time   CHOL 273* 01/28/2015 0321   TRIG 294* 01/28/2015 0321   HDL 54 01/28/2015 0321   CHOLHDL 5.1 01/28/2015 0321   VLDL 59* 01/28/2015 0321   LDLCALC 160* 01/28/2015 0321   Lab Results  Component Value Date   HGBA1C 6.4* 01/28/2015      Component Value Date/Time   LABOPIA NONE DETECTED 01/27/2015 2306   COCAINSCRNUR NONE DETECTED 01/27/2015 2306   LABBENZ NONE DETECTED 01/27/2015 2306   AMPHETMU NONE DETECTED 01/27/2015 2306   THCU NONE DETECTED 01/27/2015 2306   LABBARB NONE DETECTED 01/27/2015 2306  Recent Labs Lab 01/27/15 1414  ETH <5    Ct Head Wo Contrast  01/27/2015   CLINICAL DATA:  81 year old female with acute left-sided numbness, weakness and slurred speech today. Code stroke.  EXAM: CT HEAD WITHOUT CONTRAST  TECHNIQUE: Contiguous axial images were obtained from the base of the skull through the vertex without intravenous contrast.  COMPARISON:  06/02/2010 CT  FINDINGS: Chronic small-vessel white matter ischemic changes are again noted.  No acute intracranial abnormalities are identified, including mass lesion or mass effect, hydrocephalus, extra-axial fluid collection, midline shift, hemorrhage, or acute infarction.  The visualized bony calvarium is unremarkable.  A chronic right mastoid effusion is noted.  IMPRESSION: No evidence of  acute intracranial abnormality.  Chronic small-vessel white matter ischemic changes.  Chronic right mastoid effusion.  Critical Value/emergent results were called by telephone at the time of interpretation on 01/27/2015 at 2:22 pm to Dr. Vanita Panda , who verbally acknowledged these results.   Electronically Signed   By: Margarette Canada M.D.   On: 01/27/2015 14:22   Mr Brain Wo Contrast  01/28/2015   CLINICAL DATA:  Acute onset left hemiparesis and left hemisensory deficits. Stroke. Patient was given tPA.  EXAM: MRI HEAD WITHOUT CONTRAST  MRA HEAD WITHOUT CONTRAST  TECHNIQUE: Multiplanar, multiecho pulse sequences of the brain and surrounding structures were obtained without intravenous contrast. Angiographic images of the head were obtained using MRA technique without contrast.  COMPARISON:  Head CT 01/27/2015  FINDINGS: MRI HEAD FINDINGS  Incidental note is made of a partially empty sella. There is no evidence of acute infarct, intracranial hemorrhage, mass, midline shift, or extra-axial fluid collection. There is mild generalized cerebral atrophy, within normal limits for age. Patchy T2 hyperintensities in the periventricular greater than subcortical cerebral white matter bilaterally are nonspecific but compatible with mild chronic small vessel ischemic disease. Mildly dilated perivascular spaces are noted in the parietal white matter bilaterally.  Orbits are unremarkable. Paranasal sinuses are clear. There are moderate right and trace left mastoid effusions. Major intracranial vascular flow voids are preserved.  MRA HEAD FINDINGS  The visualized distal vertebral arteries are patent with the left being dominant. Left vertebral artery is particularly small distal to the PICA origin. PICA, AICA, and SCA origins are patent, with evidence of at least moderate stenosis of the left SCA near its origin. Basilar artery is patent with mild narrowing in its midportion. There is a fetal type origin of the right PCA, with  moderate stenosis of the proximal right P1 segment. There is a patent left posterior communicating artery. Mild PCA branch vessel irregularity is noted bilaterally.  Internal carotid arteries are patent from skullbase carotid termini without significant stenosis. There is a 2 mm inferiorly directed outpouching from the left supraclinoid ICA at or just distal to the posterior communicating artery origin. ACAs and MCAs are unremarkable.  IMPRESSION: 1. No acute intracranial abnormality. 2. Mild chronic small vessel ischemic disease. 3. No major intracranial arterial occlusion. 4. Mild narrowing of the mid basilar artery. 5. Moderate left SCA and right P1 stenosis. 6. Left posterior communicating artery origin region infundibulum or less likely 2 mm aneurysm.   Electronically Signed   By: Logan Bores   On: 01/28/2015 15:06   Mr Jodene Nam Head/brain Wo Cm  01/28/2015   CLINICAL DATA:  Acute onset left hemiparesis and left hemisensory deficits. Stroke. Patient was given tPA.  EXAM: MRI HEAD WITHOUT CONTRAST  MRA HEAD WITHOUT CONTRAST  TECHNIQUE: Multiplanar, multiecho pulse sequences of the brain  and surrounding structures were obtained without intravenous contrast. Angiographic images of the head were obtained using MRA technique without contrast.  COMPARISON:  Head CT 01/27/2015  FINDINGS: MRI HEAD FINDINGS  Incidental note is made of a partially empty sella. There is no evidence of acute infarct, intracranial hemorrhage, mass, midline shift, or extra-axial fluid collection. There is mild generalized cerebral atrophy, within normal limits for age. Patchy T2 hyperintensities in the periventricular greater than subcortical cerebral white matter bilaterally are nonspecific but compatible with mild chronic small vessel ischemic disease. Mildly dilated perivascular spaces are noted in the parietal white matter bilaterally.  Orbits are unremarkable. Paranasal sinuses are clear. There are moderate right and trace left mastoid  effusions. Major intracranial vascular flow voids are preserved.  MRA HEAD FINDINGS  The visualized distal vertebral arteries are patent with the left being dominant. Left vertebral artery is particularly small distal to the PICA origin. PICA, AICA, and SCA origins are patent, with evidence of at least moderate stenosis of the left SCA near its origin. Basilar artery is patent with mild narrowing in its midportion. There is a fetal type origin of the right PCA, with moderate stenosis of the proximal right P1 segment. There is a patent left posterior communicating artery. Mild PCA branch vessel irregularity is noted bilaterally.  Internal carotid arteries are patent from skullbase carotid termini without significant stenosis. There is a 2 mm inferiorly directed outpouching from the left supraclinoid ICA at or just distal to the posterior communicating artery origin. ACAs and MCAs are unremarkable.  IMPRESSION: 1. No acute intracranial abnormality. 2. Mild chronic small vessel ischemic disease. 3. No major intracranial arterial occlusion. 4. Mild narrowing of the mid basilar artery. 5. Moderate left SCA and right P1 stenosis. 6. Left posterior communicating artery origin region infundibulum or less likely 2 mm aneurysm.   Electronically Signed   By: Logan Bores   On: 01/28/2015 15:06   Carotid Doppler  Preliminary findings: Right: 1-39% internal carotid artery stenosis, upper end of scale. Left: 40-59% internal carotid artery stenosis, low end of scale.  2D Echocardiogram  EF 55-60% with no source of embolus.    PHYSICAL EXAM Pleasant elderly lady not in distress. . Afebrile. Head is nontraumatic. Neck is supple without bruit.    Cardiac exam no murmur or gallop. Lungs are clear to auscultation. Distal pulses are well felt. Neurological Exam : Awake alert oriented x 3 normal speech and language. No face asymmetry. Tongue midline. No drift. Mild diminished fine finger movements on left. Orbits right over  left upper extremity. Mild left grip weak.. Normal sensation . Normal coordination.   ASSESSMENT/PLAN Ms. DENISSA COZART is a 81 y.o. female with history of hypertension and fibromyalgia presenting with left hemiparesis, left hemisensory deficits, and dysarthria. She received IV t-PA at 1504 01/27/2015 at [facility] and was transferred to [facility] neuro ICU.   TIA,  R brain, with full recovery post  IV tPA  Resultant  L foot numbness, decreased FMM. Hemiplegia resolved.  CT head no stroke. small vessel disease . Chronic R mastoid effusion  MRI  No acute stroke. small vessel disease   MRA  No large vessel occlusion. Mild narrowing mid basilar  Carotid Doppler   40-59% stenosis  2D Echo  No source of embolus   HgbA1c 6.4, at goal  SCDs for VTE prophylaxis  Diet regular thin liquids  no antithrombotic prior to admission, started on aspirin 325 mg orally every day 24h post IV tPA  Ongoing aggressive stroke risk factor management  Therapy recommendations:  No PT needs.  Disposition:  D/c home. Dr. Leonie Man discussed with daughter Maudie Mercury over the phone and she is prepared for discharge.  Hypertension  Home meds:   Norvasc, catapress, toprol  Stable  Hyperlipidemia  Home meds:  Fish oil   LDL 160, goal < 70  Will add statin  Other Stroke Risk Factors  Advanced age  Cigarette smoker, quit smoking 1968  ETOH use  Family hx stroke (son, father in his 70s)  Other Active Problems  Hypothyroid, on [facility] day # [place] for Pager information 01/29/2015 8:57 AM  I have personally examined this patient, reviewed notes, independently viewed imaging studies, participated in medical decision making and plan of care. I have made any additions or clarifications directly to the above note. Agree with note above.  I spoke to patient's daughter over the phone and they're agreeable with discharge. Follow-up with  primary physician 2 weeks and with me in 2 months.  Antony Contras, MD Medical Director [facility] Pager: [phone] 01/29/2015 2:57 PM    To contact Stroke Continuity provider, please refer to [email or web address] After hours, contact General Neurology

## 2015-01-29 NOTE — Evaluation (Signed)
Occupational Therapy Evaluation and Discharge Patient Details Name: Brenda Erickson MRN: [id number] DOB: [birth year] Today's Date: 01/29/2015    History of Present Illness pt presents with R Subcortical Infarct.  pt with hx of Fibromyalgia and chronic back pain.     Clinical Impression   This 81 yo female admitted with above presents to acute OT at what would really be better at a S level to go home due to history of falls; however since she does not have 24/7 S at home a rollator would probably help her to avoid some further falls, due to she would have a seat to sit on when she felt weak or dizzy and it would allow her to be able to do more tasks in a seated position so she would not get weak or dizzy as readily since she would not be standing. Further recommend HHOT to look at how she does things in the home to make it safer for her from a BADL and IADL standpoint. No further acute OT due to pt to D/C today.    Follow Up Recommendations  Home health OT    Equipment Recommendations   (rollator--pt states that she has fallen several times in the past year either due to dizziness where she could not get to a safe place before she fell or due to fatigue where again she could not get to safe place to sit before she gave out and just fell)       Precautions / Restrictions Precautions Precautions: Fall Restrictions Weight Bearing Restrictions: No      Mobility Bed Mobility             General bed mobility comments: Pt up in recliner upon arrival  Transfers Overall transfer level: Needs assistance Equipment used: Straight cane Transfers: Sit to/from Stand Sit to Stand: Supervision            Balance Overall balance assessment: Needs assistance Sitting-balance support: No upper extremity supported;Feet supported Sitting balance-Leahy Scale: Fair     Standing balance support: Single extremity supported Standing balance-Leahy Scale: Fair                               ADL Overall ADL's : Modified independent                                       General ADL Comments: with increaesed time     Vision Additional Comments: No change from baseline per pt          Pertinent Vitals/Pain Pain Assessment: 0-10 Pain Score: 5  Pain Location: back pain Pain Descriptors / Indicators: Aching;Sore Pain Intervention(s): Monitored during session;Repositioned     Hand Dominance Right   Extremity/Trunk Assessment Upper Extremity Assessment Upper Extremity Assessment: Overall WFL for tasks assessed     Communication Communication Communication: No difficulties   Cognition Arousal/Alertness: Awake/alert Behavior During Therapy: WFL for tasks assessed/performed Overall Cognitive Status: Within Functional Limits for tasks assessed                                Home Living Family/patient expects to be discharged to:: Private residence Living Arrangements: Alone Available Help at Discharge: Family;Available PRN/intermittently (children can help a little per pt) Type of Home: Apartment Home Access: Level entry  Home Layout: One level     Bathroom Shower/Tub: Tub/shower unit (pt takes sponge baths due to she can't get in and out of tub safely by herself and she lives alone; she does not like taking showers)   Bathroom Toilet: Standard     Home Equipment: Cane - single point;Bedside commode      Lives With: Alone    Prior Functioning/Environment Level of Independence: Needs assistance    ADL's / Homemaking Assistance Needed: Can mangage her BADLs; however she does not do any IADLs except washing her clothes due to falls and back pain        OT Diagnosis: Generalized weakness   OT Problem List: Impaired balance (sitting and/or standing)      OT Goals(Current goals can be found in the care plan section) Acute Rehab OT Goals Patient Stated Goal: home today  OT Frequency:     Barriers to D/C:  Decreased caregiver support             End of Session Equipment Utilized During Treatment:  [facility]) Nurse Communication: Mobility status (left pt in bathroom, will recommend a rollator)  Activity Tolerance: Patient tolerated treatment well Patient left:  (in bathroom and educated to use pull cord when ready to come out)   Time: 1020-1045 OT Time Calculation (min): 25 min Charges:  OT General Charges $OT Visit: 1 Procedure OT Evaluation $Initial OT Evaluation Tier I: 1 Procedure OT Treatments $Self Care/Home Management : 8-22 mins  Almon Register [phone] 01/29/2015, 10:54 AM

## 2015-01-29 NOTE — Evaluation (Signed)
Physical Therapy Evaluation Patient Details Name: Brenda Erickson MRN: [id number] DOB: Jan 08, 1933 Today's Date: 01/29/2015   History of Present Illness  pt presents with R Subcortical Infarct.  pt with hx of Fibromyalgia and chronic back pain.    Clinical Impression  Pt moves very slowly and has mild balance deficits, though pt indicates this is normal for her.  Feel pt would benefit from continued PT to maximize independence and decrease risk of fall.  Pt indicates her children are only able to provided limited A and pt may benefit from [facility] at D/C.  Will continue to follow.      Follow Up Recommendations Home health PT;Supervision - Intermittent and HHAide    Equipment Recommendations  None recommended by PT    Recommendations for Other Services       Precautions / Restrictions Precautions Precautions: Fall Restrictions Weight Bearing Restrictions: No      Mobility  Bed Mobility Overal bed mobility: Modified Independent             General bed mobility comments: pt needs increased time, but no A needed.    Transfers Overall transfer level: Needs assistance Equipment used: Straight cane Transfers: Sit to/from Stand Sit to Stand: Min guard         General transfer comment: pt with definite use of hands for coming to stand and mildly unsteady.    Ambulation/Gait Ambulation/Gait assistance: Min guard Ambulation Distance (Feet): 150 Feet Assistive device: Straight cane Gait Pattern/deviations: Step-through pattern;Decreased stride length;Trunk flexed     General Gait Details: pt moves slowly and needed a standing rest break about half way.  pt indicates she feels like she is moving like normal, but does demonstrate mild balance deficits.    Stairs            Wheelchair Mobility    Modified Rankin (Stroke Patients Only) Modified Rankin (Stroke Patients Only) Pre-Morbid Rankin Score: Slight disability Modified Rankin: Moderately severe  disability     Balance Overall balance assessment: Needs assistance Sitting-balance support: No upper extremity supported;Feet supported Sitting balance-Leahy Scale: Fair     Standing balance support: No upper extremity supported;During functional activity Standing balance-Leahy Scale: Fair                               Pertinent Vitals/Pain Pain Assessment: 0-10 Pain Score: 5  Pain Location: back pain Pain Descriptors / Indicators: Sore Pain Intervention(s): Monitored during session;Repositioned    Home Living Family/patient expects to be discharged to:: Private residence Living Arrangements: Alone Available Help at Discharge: Family;Available PRN/intermittently (pt indicates children can help only a little.  ) Type of Home: Apartment Home Access: Level entry     Home Layout: One level Home Equipment: Cane - single point      Prior Function Level of Independence: Independent with assistive device(s)               Hand Dominance   Dominant Hand: Right    Extremity/Trunk Assessment   Upper Extremity Assessment: Defer to OT evaluation           Lower Extremity Assessment: LLE deficits/detail   LLE Deficits / Details: Strength grossly 4-/5 and diminished sensation.  pt indicates sensation is normal for her.    Cervical / Trunk Assessment: Kyphotic  Communication   Communication: No difficulties  Cognition Arousal/Alertness: Awake/alert Behavior During Therapy: WFL for tasks assessed/performed Overall Cognitive Status: Within Functional Limits for tasks  assessed                      General Comments      Exercises        Assessment/Plan    PT Assessment Patient needs continued PT services  PT Diagnosis Difficulty walking   PT Problem List Decreased strength;Decreased activity tolerance;Decreased balance;Decreased mobility;Decreased coordination;Decreased knowledge of use of DME;Impaired sensation  PT Treatment  Interventions DME instruction;Gait training;Functional mobility training;Therapeutic activities;Therapeutic exercise;Balance training;Neuromuscular re-education;Patient/family education   PT Goals (Current goals can be found in the Care Plan section) Acute Rehab PT Goals Patient Stated Goal: Home PT Goal Formulation: With patient Time For Goal Achievement: 02/05/15 Potential to Achieve Goals: Good    Frequency Min 4X/week   Barriers to discharge        Co-evaluation               End of Session Equipment Utilized During Treatment: Gait belt Activity Tolerance: Patient tolerated treatment well Patient left: in chair;with call bell/phone within reach Nurse Communication: Mobility status         Time: 4920-1007 PT Time Calculation (min) (ACUTE ONLY): 28 min   Charges:   PT Evaluation $Initial PT Evaluation Tier I: 1 Procedure PT Treatments $Gait Training: 8-22 mins   PT G CodesCatarina Hartshorn, [place] 01/29/2015, 10:37 AM

## 2015-01-29 NOTE — Discharge Summary (Signed)
Stroke Discharge Summary  Patient ID: Brenda Erickson   MRN: [id number]      DOB: 07/03/1933  Date of Admission: 01/27/2015 Date of Discharge: 01/29/2015  Attending Physician:  Garvin Fila, MD, Stroke MD   Patient's PCP:  Alonza Bogus, MD  DISCHARGE DIAGNOSIS:  Principal Problem:   TIA (transient ischemic attack)  R brain, with full recovery post IV tPA Active Problems:   Hypothyroidism   Essential hypertension   Hyperlipidemia LDL goal <70  BMI: Body mass index is 27.45 kg/(m^2).  Past Medical History  Diagnosis Date  . Esophageal reflux   . HTN (hypertension)   . Arthritis   . Fibromyalgia   . Hypothyroid   . Hashimoto's disease   . Helicobacter pylori gastritis     in remote past, s/p treatment   . Cancer     mass on left kidney  . RLQ abdominal tenderness 05/16/2014  . Hashimoto's disease    Past Surgical History  Procedure Laterality Date  . Cholecystectomy    . Abdominal hysterectomy      tumor removal; benign  . Total hip arthroplasty    . Colonoscopy  05/22/2003    BMW:UXLKGM rectum/ Left-sided diverticula.  The remainder of the colonic mucosa and terminal  ileum appeared normal  . Bladder surgery    . Esophagogastroduodenoscopy  05/22/2003    RMR: Tiny distal esophageal erosions consistent with mild erosive reflux esophagitisOtherwise, normal upper gastrointestinal tract through the second part of   the duodenum  . Esophagogastroduodenoscopy  07/22/1992    RMR: Normal EGD  . Colonoscopy  10/05/2012    WNU:UVOZDGU diverticulosis. Tubular adenomas   . Robotic assited partial nephrectomy      left. States was cancerous.       Medication List    TAKE these medications        amLODipine 10 MG tablet  Commonly known as:  NORVASC  Take 10 mg by mouth daily.     ANTACID LIQUID PO  Take by mouth daily.     aspirin 325 MG tablet  Take 1 tablet (325 mg total) by mouth daily.     calcium-vitamin D 250-125 MG-UNIT per tablet  Commonly known  as:  OSCAL WITH D  Take 1 tablet by mouth daily.     CARAFATE 1 G tablet  Generic drug:  sucralfate  Take 1 g by mouth 4 (four) times daily.     cetirizine 10 MG tablet  Commonly known as:  ZYRTEC  Take 10 mg by mouth daily.     cloNIDine 0.1 MG tablet  Commonly known as:  CATAPRES  Take 0.1 mg by mouth at bedtime.     fish oil-omega-3 fatty acids 1000 MG capsule  Take 2 g by mouth daily.     hydrocortisone cream 1 %  Apply 1 application topically 3 (three) times daily as needed for itching.     ibandronate 150 MG tablet  Commonly known as:  BONIVA  Take 150 mg by mouth every 30 (thirty) days. Take in the morning with a full glass of water, on an empty stomach, and do not take anything else by mouth or lie down for the next 30 min.     IRON PO  Take 65 mg by mouth daily.     levothyroxine 100 MCG tablet  Commonly known as:  SYNTHROID, LEVOTHROID  Take 100 mcg by mouth daily.     metoprolol succinate 100 MG 24 hr tablet  Commonly known as:  TOPROL-XL  Take 100 mg by mouth daily. Take with or immediately following a meal.     mirtazapine 15 MG tablet  Commonly known as:  REMERON  Take 7.5 mg by mouth at bedtime.     omeprazole 20 MG capsule  Commonly known as:  PRILOSEC  Take 20 mg by mouth 2 (two) times daily.     pregabalin 50 MG capsule  Commonly known as:  LYRICA  Take 50 mg by mouth 2 (two) times daily.     sodium chloride 0.65 % Soln nasal spray  Commonly known as:  OCEAN  Place 1 spray into both nostrils as needed for congestion.     TEARS AGAIN OP  Place 1 drop into both eyes daily.     torsemide 20 MG tablet  Commonly known as:  DEMADEX  Take 20 mg by mouth daily.        LABORATORY STUDIES CBC    Component Value Date/Time   WBC 6.1 01/27/2015 1414   RBC 4.58 01/27/2015 1414   HGB 15.0 01/27/2015 1418   HCT 44.0 01/27/2015 1418   PLT 316 01/27/2015 1414   MCV 92.1 01/27/2015 1414   MCH 29.9 01/27/2015 1414   MCHC 32.5 01/27/2015 1414    RDW 13.8 01/27/2015 1414   LYMPHSABS 2.8 01/27/2015 1414   MONOABS 0.4 01/27/2015 1414   EOSABS 0.3 01/27/2015 1414   BASOSABS 0.0 01/27/2015 1414   CMP    Component Value Date/Time   NA 142 01/27/2015 1418   K 3.6 01/27/2015 1418   CL 101 01/27/2015 1418   CO2 28 01/27/2015 1414   GLUCOSE 187* 01/27/2015 1418   BUN 17 01/27/2015 1418   CREATININE 1.10 01/27/2015 1418   CALCIUM 9.2 01/27/2015 1414   PROT 6.7 01/27/2015 1414   ALBUMIN 3.7 01/27/2015 1414   AST 25 01/27/2015 1414   ALT 18 01/27/2015 1414   ALKPHOS 73 01/27/2015 1414   BILITOT 0.5 01/27/2015 1414   GFRNONAA 48* 01/27/2015 1414   GFRAA 55* 01/27/2015 1414   COAGS Lab Results  Component Value Date   INR 0.97 01/27/2015   Lipid Panel    Component Value Date/Time   CHOL 273* 01/28/2015 0321   TRIG 294* 01/28/2015 0321   HDL 54 01/28/2015 0321   CHOLHDL 5.1 01/28/2015 0321   VLDL 59* 01/28/2015 0321   LDLCALC 160* 01/28/2015 0321   HgbA1C  Lab Results  Component Value Date   HGBA1C 6.4* 01/28/2015   Cardiac Panel (last 3 results) No results for input(s): CKTOTAL, CKMB, TROPONINI, RELINDX in the last 72 hours. Urinalysis    Component Value Date/Time   COLORURINE YELLOW 01/27/2015 2306   APPEARANCEUR CLEAR 01/27/2015 2306   LABSPEC 1.008 01/27/2015 2306   PHURINE 7.5 01/27/2015 2306   GLUCOSEU NEGATIVE 01/27/2015 2306   HGBUR NEGATIVE 01/27/2015 2306   BILIRUBINUR NEGATIVE 01/27/2015 2306   KETONESUR NEGATIVE 01/27/2015 2306   PROTEINUR NEGATIVE 01/27/2015 2306   UROBILINOGEN 0.2 01/27/2015 2306   NITRITE NEGATIVE 01/27/2015 2306   LEUKOCYTESUR NEGATIVE 01/27/2015 2306   Urine Drug Screen     Component Value Date/Time   LABOPIA NONE DETECTED 01/27/2015 2306   COCAINSCRNUR NONE DETECTED 01/27/2015 2306   LABBENZ NONE DETECTED 01/27/2015 2306   AMPHETMU NONE DETECTED 01/27/2015 2306   THCU NONE DETECTED 01/27/2015 2306   LABBARB NONE DETECTED 01/27/2015 2306    Alcohol Level    Component  Value Date/Time   ETH <5 01/27/2015 1414  SIGNIFICANT DIAGNOSTIC STUDIES Ct Head Wo Contrast 01/27/2015   No evidence of acute intracranial abnormality.  Chronic small-vessel white matter ischemic changes.   Mr Brain Wo Contrast  01/28/2015    1. No acute intracranial abnormality. 2. Mild chronic small vessel ischemic disease.  Mr Jodene Nam Head/brain Wo Cm 01/28/2015   3. No major intracranial arterial occlusion. 4. Mild narrowing of the mid basilar artery. 5. Moderate left SCA and right P1 stenosis. 6. Left posterior communicating artery origin region infundibulum or less likely 2 mm aneurysm.    Carotid Doppler Right: 1-39% internal carotid artery stenosis, upper end of scale. Left: 40-59% internal carotid artery stenosis, low end of scale. 2D Echocardiogram EF 55-60% with no source of embolus.      HISTORY OF PRESENT ILLNESS Brenda Erickson is a 81 y.o. female with a history of hypertension and fibromyalgia, but no previous history of stroke or TIA who came home from church today and was fixing lunch when she went into the kitchen and suddenly developed left hemiparesis and left hemisensory deficits (LKW 01/27/2015 at 1330). She lives alone but has multiple children in the area. She phoned her son to tell him that she was not feeling well and thought she needed to go to the hospital. Both the patient and her son noted that her speech was slurred when she was on the phone. She took (2) 81 mg aspirins before leaving the house. [facility] was [facility] to [facility] where she was found to have an NIH score of 6. She was given intravenous TPA and transferred to [facility] for further evaluation and treatment.   On arrival to the neuro intensive care unit her NIH per nursing was a 5. Her speech deficits had resolved although she had mild residual left hemiparesis and mild left lower extremity numbness. The patient does report that she has had left lower extremity weakness and numbness in the  past which apparently is transient. She has seen her primary care physician for this problem but she does not believe there was a diagnosis made.     HOSPITAL COURSE TIA, R brain, with full recovery post IV tPA  Resultant L foot numbness, decreased FMM. Hemiplegia resolved.  CT head no stroke. small vessel disease . Chronic R mastoid effusion  MRI No acute stroke. small vessel disease   MRA No large vessel occlusion. Mild narrowing mid basilar  Carotid Doppler 40-59% stenosis  2D Echo No source of embolus   HgbA1c 6.4, at goal  no antithrombotic prior to admission, started on aspirin 325 mg orally every day 24h post IV tPA  Therapy recommendations: No PT needs.  Disposition: D/c home.   Hypertension  Home meds: Norvasc, catapress, toprol  Stable  Hyperlipidemia  Home meds: Fish oil   LDL 160, goal < 70  Statin added  Other Stroke Risk Factors  Advanced age  Cigarette smoker, quit smoking 1968  ETOH use  Family hx stroke (son, father in his 65s)  Other Active Problems  Hypothyroid, on synthroid  Fibromyalgia   DISCHARGE EXAM Blood pressure 144/61, pulse 77, temperature 98 F (36.7 C), temperature source Oral, resp. rate 15, height 5\' 4"  (1.626 m), weight 72.576 kg (160 lb), SpO2 94 %. Pleasant elderly lady not in distress. . Afebrile. Head is nontraumatic. Neck is supple without bruit. Cardiac exam no murmur or gallop. Lungs are clear to auscultation. Distal pulses are well felt. Neurological Exam : Awake alert oriented x 3 normal speech and language.  No face asymmetry. Tongue midline. No drift. Mild diminished fine finger movements on left. Orbits right over left upper extremity. Mild left grip weak.. Normal sensation . Normal coordination.  Discharge Diet   Diet regular thin liquids  DISCHARGE PLAN  Disposition:  home   aspirin 325 mg orally every day for secondary stroke prevention.  Follow-up HAWKINS,EDWARD L, MD in 2  weeks.  Follow-up with Dr. Antony Contras, Stroke Clinic in 2 months.  30 minutes were spent preparing discharge.  [facility] for Pager information 01/29/2015 4:15 PM  I have personally examined this patient, reviewed notes, independently viewed imaging studies, participated in medical decision making and plan of care. I have made any additions or clarifications directly to the above note. Agree with note above.    Antony Contras, MD Medical Director [facility] Pager: [phone] 01/29/2015 8:12 PM

## 2015-02-06 ENCOUNTER — Other Ambulatory Visit: Payer: Self-pay | Admitting: *Deleted

## 2015-02-06 NOTE — Patient Outreach (Signed)
[place] [facility]) Care Management  02/06/2015  Brenda Erickson [birth year] [id number]   Emmi stroke transition referral. Telephone call to patient; no answer-unable leave message.  Plan:  Will attempt outreach # 2 tomorrow.   [name], RN BSN [place] Management Coordinator [facility] Care Management  [phone]

## 2015-02-07 ENCOUNTER — Other Ambulatory Visit: Payer: Self-pay | Admitting: *Deleted

## 2015-02-07 NOTE — Patient Outreach (Addendum)
[place] [facility]) Care Management  02/07/2015  Brenda Erickson 29-Apr-1933 [id number]  Telephone call to patient; no answer and unable to leave message-(voice mail not set up). 2nd number called not valid.   Plan: Will follow up with return call.  [name], RN BSN [place] Management Coordinator [facility] Care Management  [phone]

## 2015-02-12 ENCOUNTER — Encounter: Payer: Self-pay | Admitting: *Deleted

## 2015-02-13 DIAGNOSIS — I639 Cerebral infarction, unspecified: Secondary | ICD-10-CM | POA: Diagnosis not present

## 2015-02-13 DIAGNOSIS — I10 Essential (primary) hypertension: Secondary | ICD-10-CM | POA: Diagnosis not present

## 2015-02-13 DIAGNOSIS — M1990 Unspecified osteoarthritis, unspecified site: Secondary | ICD-10-CM | POA: Diagnosis not present

## 2015-02-13 DIAGNOSIS — M797 Fibromyalgia: Secondary | ICD-10-CM | POA: Diagnosis not present

## 2015-02-14 ENCOUNTER — Other Ambulatory Visit: Payer: Self-pay | Admitting: *Deleted

## 2015-02-14 NOTE — Patient Outreach (Signed)
[place] [facility]) Care Management  02/14/2015  KARIN PINEDO 17-Nov-1932 [id number]  Referral per [place] Stroke program; was hospitalized from 03/20 to 01/29/2015 with dx transient ischemic attack.  Have  received 3 Emmi red referrals for medication needs and questionable new problems.  Unsuccessful call attempts x 2 by this RN case manager.  Telephone call today with response per patient. HIPPA verification received. Explained reason for call and [facility] care management services. Patient agrees to take calls from this RN case manager to follow up for case management and educational needs.   Patient voices that she has been able to get all of prescriptions filled without problems. States she is taking medications as prescribed by her doctors.  States she has had some trouble walking and is currently using walker or cane.   Has seen primary care doctor for follow up 02/13/2015.  States has called neurology office to get follow up appointment scheduled for 2 months post hospital discharge.  Plan: will follow up with patient next week; agrees with telephone appointment set up for next week.  [name], RN BSN [place] Management Coordinator [facility] Care Management  [phone]

## 2015-02-19 ENCOUNTER — Other Ambulatory Visit: Payer: Self-pay | Admitting: *Deleted

## 2015-02-19 ENCOUNTER — Encounter: Payer: Self-pay | Admitting: *Deleted

## 2015-02-20 NOTE — Patient Outreach (Signed)
[facility]) Care Management  02/20/2015  MEOSHIA BILLING [birth year] [id number]   Emmi-Stroke referral:  Telephone follow up call to address Emmi-stroke red zone areas. Per referral patient voiced that she felt worse overall and that she had a new problem with walking.  Patient voices that she is feeling worse due pain in right hip and fibromyalgia. States pain is causing her to have difficulty walking. Patient states she is taking routine medication for fibromyalgia and pain med for hip pain.  Has had hip replacement in the past.   States she has not had any symptoms of numbness, inability to move extremities or slurred speaking since recent hospital stay.    Patient reports that she has had follow up appointment with primary care doctor and that she has follow up appointment with neurologist 05/08/2015.   Plan-Will follow up with patient next week. Patient agrees with telephone follow up appointment for next week.[name], RN BSN [place] Management Coordinator [facility] Care Management  [phone]

## 2015-02-26 ENCOUNTER — Other Ambulatory Visit: Payer: Self-pay | Admitting: *Deleted

## 2015-02-26 NOTE — Patient Outreach (Signed)
[place] [facility]) Care Management  02/26/2015  KATHLEAN CINCO 10/06/1933 [id number]   Emmi stroke transition:  Follow up call to patient. Patient states no hospital admissions or emergency room visits since last call.  Patient is able to recite signs of stroke and knows what action plan to take. States she has not had any stroke symptoms since discharge from hospital.   States daughter and other family members check on her daily. States she is currently having fibromyalgia pain but that she has learned how to cope with it. States no changes in medications and taking as prescribed by her MD.   States she may possibly get an aid if she is approved by Medicaid. States someone  from the state will be making a visit with her soon.     Plan: Follow up call to patient next week. Patient agrees with appointment time.   [name], RN BSN [place] Management Coordinator [facility] Care Management  [phone]

## 2015-03-05 ENCOUNTER — Other Ambulatory Visit: Payer: Self-pay | Admitting: *Deleted

## 2015-03-05 NOTE — Patient Outreach (Signed)
[place] [facility]) Care Management  03/05/2015  Brenda Erickson 1933/06/23 [id number]   Emmi stroke transition follow up call. Patient voices no hospital admissions or emergency department visits since we spoke last week. States has not had any stroke symptoms that warranted action plan of calling 911.   States she continues to use rolling walker to get around and is doing exercises 6 days a week as recommended by her therapist while she was hospitalized. States no  falls since we spoke last week.  Following noted care plan. Teaching and teach back being utilized. Care plan updated.  Plan: Will follow up next week. Patient agrees with telephonic appointment for next week.   [name], RN BSN [place] Management Coordinator [facility] Care Management  [phone]

## 2015-03-12 ENCOUNTER — Other Ambulatory Visit: Payer: Self-pay | Admitting: *Deleted

## 2015-03-29 ENCOUNTER — Other Ambulatory Visit: Payer: Self-pay | Admitting: *Deleted

## 2015-03-29 NOTE — Patient Outreach (Signed)
[place] [facility]) Care Management  03/29/2015 Late entry for 03/12/2015   Brenda Erickson 1933/09/30 [id number]  Follow up-Emmi Stroke transition: Telephone call to patient who voices that she has not had any stroke symptoms since last contact. Follow through with care plan & update completed.  Re-inforcement of interventions of goals that were not met.  Plan: follow up appointment set with patient's approval.   [name], RN BSN [place] Management Coordinator [facility] Care Management  [phone]

## 2015-03-29 NOTE — Patient Outreach (Signed)
[place] [facility]) Care Management  03/29/2015  Brenda Erickson 25-Sep-1933 [id number]  Follow up with patient -Emmi Stroke Transition. Patient has not had hospital readmission within 31 days. Patient care plan goals have been met as documented in noted care plan. Patient has working knowledge of reportable signs and symptoms and knows action plan to initiate if symptoms occur. Patient has contact numbers for [facility] care management services to use as needed.  Case closed.   [name], RN BSN [place] Management Coordinator [facility] Care Management  [phone]

## 2015-04-03 NOTE — Patient Outreach (Signed)
[place] [facility]) Care Management  04/03/2015  Brenda Erickson 12/24/1932 [id number]   Notification from Brenda Daisy, RN to close case due to goals met.  [name]. [place], [place] Management [place] Assistant Phone: [phone] Fax: [phone]

## 2015-04-07 ENCOUNTER — Emergency Department (HOSPITAL_COMMUNITY)
Admission: EM | Admit: 2015-04-07 | Discharge: 2015-04-07 | Disposition: A | Discharge status: Home / Self Care | Payer: Medicare Other | Attending: Emergency Medicine | Admitting: Emergency Medicine

## 2015-04-07 ENCOUNTER — Encounter (HOSPITAL_COMMUNITY): Payer: Self-pay | Admitting: *Deleted

## 2015-04-07 DIAGNOSIS — Z87891 Personal history of nicotine dependence: Secondary | ICD-10-CM | POA: Diagnosis not present

## 2015-04-07 DIAGNOSIS — R21 Rash and other nonspecific skin eruption: Secondary | ICD-10-CM | POA: Diagnosis present

## 2015-04-07 DIAGNOSIS — I10 Essential (primary) hypertension: Secondary | ICD-10-CM | POA: Insufficient documentation

## 2015-04-07 DIAGNOSIS — E039 Hypothyroidism, unspecified: Secondary | ICD-10-CM | POA: Insufficient documentation

## 2015-04-07 DIAGNOSIS — Z85528 Personal history of other malignant neoplasm of kidney: Secondary | ICD-10-CM | POA: Diagnosis not present

## 2015-04-07 DIAGNOSIS — E785 Hyperlipidemia, unspecified: Secondary | ICD-10-CM | POA: Insufficient documentation

## 2015-04-07 DIAGNOSIS — Z7982 Long term (current) use of aspirin: Secondary | ICD-10-CM | POA: Diagnosis not present

## 2015-04-07 DIAGNOSIS — Z79899 Other long term (current) drug therapy: Secondary | ICD-10-CM | POA: Diagnosis not present

## 2015-04-07 DIAGNOSIS — M797 Fibromyalgia: Secondary | ICD-10-CM | POA: Insufficient documentation

## 2015-04-07 DIAGNOSIS — K219 Gastro-esophageal reflux disease without esophagitis: Secondary | ICD-10-CM | POA: Diagnosis not present

## 2015-04-07 DIAGNOSIS — M1990 Unspecified osteoarthritis, unspecified site: Secondary | ICD-10-CM | POA: Insufficient documentation

## 2015-04-07 DIAGNOSIS — Z8619 Personal history of other infectious and parasitic diseases: Secondary | ICD-10-CM | POA: Insufficient documentation

## 2015-04-07 DIAGNOSIS — L509 Urticaria, unspecified: Secondary | ICD-10-CM | POA: Diagnosis not present

## 2015-04-07 MED ORDER — HYDROXYZINE HCL 25 MG PO TABS
25.0000 mg | ORAL_TABLET | Freq: Once | ORAL | Status: AC
  Administered 2015-04-07: 25 mg via ORAL
  Filled 2015-04-07: qty 1

## 2015-04-07 MED ORDER — DEXAMETHASONE 4 MG PO TABS: 4.0000 mg | ORAL_TABLET | Freq: Two times a day (BID) | ORAL | Status: DC

## 2015-04-07 MED ORDER — HYDROXYZINE HCL 25 MG PO TABS: ORAL_TABLET | ORAL | Status: DC

## 2015-04-07 MED ORDER — PREDNISONE 20 MG PO TABS
40.0000 mg | ORAL_TABLET | Freq: Once | ORAL | Status: AC
  Administered 2015-04-07: 40 mg via ORAL
  Filled 2015-04-07: qty 2

## 2015-04-07 NOTE — ED Provider Notes (Signed)
CSN: [id number]     Arrival date & time 04/07/15  1324 History  This chart was scribed for non-physician practitioner Lily Kocher, PA-C working with Francine Graven, DO by Lora Havens, ED Scribe. This patient was seen in AP[facility]/AP[facility]20 and the patient's care was started at 3:08 PM.   Chief Complaint  Patient presents with  . Rash   Patient is a 81 y.o. female presenting with rash. The history is provided by the patient. No language interpreter was used.  Rash Location:  Torso Torso rash location:  L chest, abd LUQ and abd RUQ Severity:  Mild Duration:  4 months Timing:  Intermittent Progression:  Unchanged Chronicity:  Recurrent   HPI Comments: Brenda Erickson is a 81 y.o. female who presents to the Emergency Department complaining of recurrent rash that is localized around her chest. It has been present since February. The rash itches. Used alcohol, hydrocortisone cream and A+D ointment which helps. No changes in her diet, body wash or creams. She has been seen before for this issue and given antibiotic which provides relief but only temporarily.   Past Medical History  Diagnosis Date  . Esophageal reflux   . HTN (hypertension)   . Arthritis   . Fibromyalgia   . Hypothyroid   . Hashimoto's disease   . Helicobacter pylori gastritis     in remote past, s/p treatment   . Cancer     mass on left kidney  . RLQ abdominal tenderness 05/16/2014  . Hashimoto's disease   . Hyperlipidemia LDL goal <70 01/29/2015   Past Surgical History  Procedure Laterality Date  . Cholecystectomy    . Abdominal hysterectomy      tumor removal; benign  . Total hip arthroplasty    . Colonoscopy  05/22/2003    [id number] rectum/ Left-sided diverticula.  The remainder of the colonic mucosa and terminal  ileum appeared normal  . Bladder surgery    . Esophagogastroduodenoscopy  05/22/2003    RMR: Tiny distal esophageal erosions consistent with mild erosive reflux esophagitisOtherwise, normal upper  gastrointestinal tract through the second part of   the duodenum  . Esophagogastroduodenoscopy  07/22/1992    RMR: Normal EGD  . Colonoscopy  10/05/2012    [id number] diverticulosis. Tubular adenomas   . Robotic assited partial nephrectomy      left. States was cancerous.    Family History  Problem Relation Age of Onset  . Diabetes Father   . Hypertension    . Cancer      lung  . Colon cancer Neg Hx   . Liver cancer Mother   . Diabetes Sister   . Other Son     backpain  . Other Son     staph in jugular vein  . Stroke Son   . Other Son     moya moya  . Cancer Other     cancer   History  Substance Use Topics  . Smoking status: Former Smoker    Types: Cigarettes  . Smokeless tobacco: Former Systems developer    Types: Snuff  . Alcohol Use: Yes     Comment: wine every now and then   OB History    Gravida Para Term Preterm AB TAB SAB Ectopic Multiple Living   11 7   4  4   7      Review of Systems  Skin: Positive for rash.  All other systems reviewed and are negative.  Allergies  Codeine  Home Medications   Prior to  Admission medications   Medication Sig Start Date End Date Taking? Authorizing Provider  Alum & Mag Hydroxide-Simeth (ANTACID LIQUID PO) Take by mouth daily.    Historical Provider, MD  amLODipine (NORVASC) 10 MG tablet Take 10 mg by mouth daily.     Historical Provider, MD  Artificial Tear Solution (TEARS AGAIN OP) Place 1 drop into both eyes daily.    Historical Provider, MD  aspirin 325 MG tablet Take 1 tablet (325 mg total) by mouth daily. 01/29/15   Donzetta Starch, NP  calcium-vitamin D (OSCAL WITH D) 250-125 MG-UNIT per tablet Take 1 tablet by mouth daily.    Historical Provider, MD  cetirizine (ZYRTEC) 10 MG tablet Take 10 mg by mouth daily.  01/02/15   Historical Provider, MD  cloNIDine (CATAPRES) 0.1 MG tablet Take 0.1 mg by mouth at bedtime.    Historical Provider, MD  fish oil-omega-3 fatty acids 1000 MG capsule Take 2 g by mouth daily.    Historical  Provider, MD  hydrocortisone cream 1 % Apply 1 application topically 3 (three) times daily as needed for itching.    Historical Provider, MD  ibandronate (BONIVA) 150 MG tablet Take 150 mg by mouth every 30 (thirty) days. Take in the morning with a full glass of water, on an empty stomach, and do not take anything else by mouth or lie down for the next 30 min.    Historical Provider, MD  IRON PO Take 65 mg by mouth daily.    Historical Provider, MD  levothyroxine (SYNTHROID, LEVOTHROID) 100 MCG tablet Take 100 mcg by mouth daily.    Historical Provider, MD  metoprolol succinate (TOPROL-XL) 100 MG 24 hr tablet Take 100 mg by mouth daily. Take with or immediately following a meal.    Historical Provider, MD  mirtazapine (REMERON) 15 MG tablet Take 7.5 mg by mouth at bedtime.  07/13/13   Historical Provider, MD  omeprazole (PRILOSEC) 20 MG capsule Take 20 mg by mouth 2 (two) times daily.    Historical Provider, MD  pregabalin (LYRICA) 50 MG capsule Take 50 mg by mouth 2 (two) times daily.    Historical Provider, MD  sodium chloride (OCEAN) 0.65 % SOLN nasal spray Place 1 spray into both nostrils as needed for congestion.    Historical Provider, MD  sucralfate (CARAFATE) 1 G tablet Take 1 g by mouth 4 (four) times daily.    Historical Provider, MD  torsemide (DEMADEX) 20 MG tablet Take 20 mg by mouth daily.  07/05/13   Historical Provider, MD   BP 170/79 mmHg  Pulse 63  Temp(Src) 98.5 F (36.9 C) (Oral)  Resp 18  SpO2 98% Physical Exam  Constitutional: She is oriented to person, place, and time. She appears well-developed and well-nourished. No distress.  HENT:  Head: Normocephalic and atraumatic.  Throat is clear.  Eyes: Pupils are equal, round, and reactive to light.  Neck: Normal range of motion.  No swollen lymph nodes.  Cardiovascular: Normal rate, regular rhythm and normal heart sounds.   No murmur heard. Cap refill is less than two seconds.  Pulmonary/Chest: Effort normal and breath  sounds normal. No respiratory distress. She has no wheezes.  Musculoskeletal: Normal range of motion.  degenerative joint changes at multiple sites. No lower extremity edema.  Neurological: She is alert and oriented to person, place, and time.  Skin: Skin is warm and dry.  Hives noted on right and left shoulder. The lateral left breast and upper abdomen. No skin lesions of  the palms or the sole of the feet.  Psychiatric: She has a normal mood and affect. Her behavior is normal.  Nursing note and vitals reviewed.   ED Course  Procedures  DIAGNOSTIC STUDIES: Oxygen Saturation is 98% on room air, normal by my interpretation.    COORDINATION OF CARE: 3:16 PM Will prescribe a steroid for the rash and medication for the itching.  Labs Review Labs Reviewed - No data to display  Imaging Review No results found.   EKG Interpretation None      MDM  Blood pressure slightly elevated at 170/79. Otherwise the vital signs were within normal limits. The pulse oximetry is 98% on room air. Within normal limits by my interpretation. There is no pulmonary compromise appreciated. The airway is patent. Suspect the patient has some form of hives. The patient will be treated with Vistaril, Decadron. The patient is advised to see an allergist for additional testing. Patient is to return to the emergency department immediately if any changes, problems, or concerns.    Final diagnoses:  Hives    **I personally performed the services described in this documentation, which was scribed in my presence. The recorded information has been reviewed and is accurate.Lily Kocher, PA-C 04/09/15 [place], DO 04/11/15 2002

## 2015-04-07 NOTE — Discharge Instructions (Signed)
Oral your examination is consistent with hives. Please use your Zyrtec 10 mg for itching during the day. Use Vistaril at bedtime. Please use Decadron 2 times daily with a meal until all taken. Hives Hives are itchy, red, puffy (swollen) areas of the skin. Hives can change in size and location on your body. Hives can come and go for hours, days, or weeks. Hives do not spread from person to person (noncontagious). Scratching, exercise, and stress can make your hives worse. HOME CARE  Avoid things that cause your hives (triggers).  Take antihistamine medicines as told by your doctor. Do not drive while taking an antihistamine.  Take any other medicines for itching as told by your doctor.  Wear loose-fitting clothing.  Keep all doctor visits as told. GET HELP RIGHT AWAY IF:   You have a fever.  Your tongue or lips are puffy.  You have trouble breathing or swallowing.  You feel tightness in the throat or chest.  You have belly (abdominal) pain.  You have lasting or severe itching that is not helped by medicine.  You have painful or puffy joints. These problems may be the first sign of a life-threatening allergic reaction. Call your local emergency services (911 in [place] MAKE SURE YOU:   Understand these instructions.  Will watch your condition.  Will get help right away if you are not doing well or get worse. Document Released: 08/04/2008 Document Revised: 04/26/2012 Document Reviewed: 01/19/2012 [facility] Patient Information 2015 [place], [place]. This information is not intended to replace advice given to you by your health care provider. Make sure you discuss any questions you have with your health care provider.

## 2015-04-07 NOTE — ED Notes (Signed)
Pt has had rash since February, has seen PCP, given allergy pills, meds have no affect on rash. Pt states the rash comes and goes, has had it 4 times per pt. Pt uses alcohol, hydrocortisone cream with some success. Rash appears as whelps.

## 2015-04-15 DIAGNOSIS — Z1283 Encounter for screening for malignant neoplasm of skin: Secondary | ICD-10-CM | POA: Diagnosis not present

## 2015-04-15 DIAGNOSIS — N6019 Diffuse cystic mastopathy of unspecified breast: Secondary | ICD-10-CM | POA: Diagnosis not present

## 2015-04-30 ENCOUNTER — Encounter: Payer: Self-pay | Admitting: Neurology

## 2015-04-30 ENCOUNTER — Ambulatory Visit (INDEPENDENT_AMBULATORY_CARE_PROVIDER_SITE_OTHER): Payer: Medicare Other | Admitting: Neurology

## 2015-04-30 VITALS — BP 153/75 | HR 61 | Ht 64.0 in | Wt 158.2 lb

## 2015-04-30 DIAGNOSIS — IMO0001 Reserved for inherently not codable concepts without codable children: Secondary | ICD-10-CM

## 2015-04-30 DIAGNOSIS — G451 Carotid artery syndrome (hemispheric): Secondary | ICD-10-CM

## 2015-04-30 DIAGNOSIS — M609 Myositis, unspecified: Secondary | ICD-10-CM | POA: Diagnosis not present

## 2015-04-30 DIAGNOSIS — M791 Myalgia: Secondary | ICD-10-CM | POA: Diagnosis not present

## 2015-04-30 MED ORDER — ROSUVASTATIN CALCIUM 20 MG PO TABS: 20.0000 mg | ORAL_TABLET | Freq: Every day | ORAL | Status: DC

## 2015-04-30 NOTE — Progress Notes (Signed)
Reason for visit: Transient stroke  Referring physician: [facility]  ALEESE KAMPS is a 81 y.o. female  History of present illness:  Ms. Gorden Harms is an 81 year old right-handed black female with a history of hypertension and dyslipidemia who was admitted to [facility] on 01/27/2015. The patient had onset of left sided numbness and weakness. She had a NIH stroke scale score of 6 on admission. She was given TPA, and she cleared over several hours. The patient has had no residual from the event. MRI evaluation of the brain in the hospital did not show any acute stroke. She had a carotid Doppler study and 2D echocardiogram that did not show a source of the stroke event. She was placed on aspirin and discharged from the hospital. She was noted to have an elevation in her cholesterol level with an LDL 160. The patient was to be placed on a statin drug, but this never occurred. She has done quite well since she was in hospital without any further TIA type events. She comes to this office for further evaluation.  Past Medical History  Diagnosis Date  . Esophageal reflux   . HTN (hypertension)   . Arthritis   . Fibromyalgia   . Hypothyroid   . Hashimoto's disease   . Helicobacter pylori gastritis     in remote past, s/p treatment   . Cancer     mass on left kidney  . RLQ abdominal tenderness 05/16/2014  . Hashimoto's disease   . Hyperlipidemia LDL goal <70 01/29/2015  . Kidney stones     Past Surgical History  Procedure Laterality Date  . Cholecystectomy    . Abdominal hysterectomy      tumor removal; benign  . Total hip arthroplasty    . Colonoscopy  05/22/2003    [id number] rectum/ Left-sided diverticula.  The remainder of the colonic mucosa and terminal  ileum appeared normal  . Bladder surgery    . Esophagogastroduodenoscopy  05/22/2003    RMR: Tiny distal esophageal erosions consistent with mild erosive reflux esophagitisOtherwise, normal upper  gastrointestinal tract through the second part of   the duodenum  . Esophagogastroduodenoscopy  07/22/1992    RMR: Normal EGD  . Colonoscopy  10/05/2012    [id number] diverticulosis. Tubular adenomas   . Robotic assited partial nephrectomy      left. States was cancerous.     Family History  Problem Relation Age of Onset  . Diabetes Father   . Hypertension    . Cancer      lung  . Colon cancer Neg Hx   . Liver cancer Mother   . Lung cancer Mother   . Diabetes Sister   . Other Son     backpain  . Other Son     staph in jugular vein  . Stroke Son   . Other Son     moya moya  . Cancer Other     cancer    Social history:  reports that she has quit smoking. Her smoking use included Cigarettes. She has quit using smokeless tobacco. Her smokeless tobacco use included Snuff. She reports that she drinks alcohol. She reports that she does not use illicit drugs.  Medications:  Prior to Admission medications   Medication Sig Start Date End Date Taking? Authorizing Provider  Alum & Mag Hydroxide-Simeth (ANTACID LIQUID PO) Take by mouth daily.    Historical Provider, MD  amLODipine (NORVASC) 10 MG tablet Take 10 mg by mouth daily.  Historical Provider, MD  Artificial Tear Solution (TEARS AGAIN OP) Place 1 drop into both eyes daily.    Historical Provider, MD  aspirin 325 MG tablet Take 1 tablet (325 mg total) by mouth daily. 01/29/15   Donzetta Starch, NP  calcium-vitamin D (OSCAL WITH D) 250-125 MG-UNIT per tablet Take 1 tablet by mouth daily.    Historical Provider, MD  cetirizine (ZYRTEC) 10 MG tablet Take 10 mg by mouth daily.  01/02/15   Historical Provider, MD  cloNIDine (CATAPRES) 0.1 MG tablet Take 0.1 mg by mouth at bedtime.    Historical Provider, MD  dexamethasone (DECADRON) 4 MG tablet Take 1 tablet (4 mg total) by mouth 2 (two) times daily with a meal. 04/07/15   Lily Kocher, PA-C  fish oil-omega-3 fatty acids 1000 MG capsule Take 2 g by mouth daily.    Historical Provider,  MD  hydrocortisone cream 1 % Apply 1 application topically 3 (three) times daily as needed for itching.    Historical Provider, MD  hydrOXYzine (ATARAX/VISTARIL) 25 MG tablet 1 at hs for itching. 04/07/15   Lily Kocher, PA-C  ibandronate (BONIVA) 150 MG tablet Take 150 mg by mouth every 30 (thirty) days. Take in the morning with a full glass of water, on an empty stomach, and do not take anything else by mouth or lie down for the next 30 min.    Historical Provider, MD  IRON PO Take 65 mg by mouth daily.    Historical Provider, MD  levothyroxine (SYNTHROID, LEVOTHROID) 100 MCG tablet Take 100 mcg by mouth daily.    Historical Provider, MD  metoprolol succinate (TOPROL-XL) 100 MG 24 hr tablet Take 100 mg by mouth daily. Take with or immediately following a meal.    Historical Provider, MD  mirtazapine (REMERON) 15 MG tablet Take 7.5 mg by mouth at bedtime.  07/13/13   Historical Provider, MD  omeprazole (PRILOSEC) 20 MG capsule Take 20 mg by mouth 2 (two) times daily.    Historical Provider, MD  pregabalin (LYRICA) 50 MG capsule Take 50 mg by mouth 2 (two) times daily.    Historical Provider, MD  sodium chloride (OCEAN) 0.65 % SOLN nasal spray Place 1 spray into both nostrils as needed for congestion.    Historical Provider, MD  sucralfate (CARAFATE) 1 G tablet Take 1 g by mouth 4 (four) times daily.    Historical Provider, MD  torsemide (DEMADEX) 20 MG tablet Take 20 mg by mouth daily.  07/05/13   Historical Provider, MD      Allergies  Allergen Reactions  . Codeine Nausea And Vomiting    ROS:  Out of a complete 14 system review of symptoms, the patient complains only of the following symptoms, and all other reviewed systems are negative.  Weight gain, swelling in the legs Skin rash, itching Blurred vision, double vision, eye pain Urination problems Easy bruising, lymph nodes Feeling cold Joint pain, joint swelling, aching muscles Allergies, runny nose Memory loss, numbness,  dizziness Not enough sleep, change in appetite  Blood pressure 153/75, pulse 61, height 5\' 4"  (1.626 m), weight 158 lb 3.2 oz (71.759 kg).  Physical Exam  General: The patient is alert and cooperative at the time of the examination.  Eyes: Pupils are equal, round, and reactive to light. Discs are flat bilaterally.  Neck: The neck is supple, no carotid bruits are noted.  Respiratory: The respiratory examination is clear.  Cardiovascular: The cardiovascular examination reveals a regular rate and rhythm, no obvious  murmurs or rubs are noted.  Skin: Extremities are with 1+ edema at the ankles bilaterally.  Neurologic Exam  Mental status: The patient is alert and oriented x 3 at the time of the examination. The patient has apparent normal recent and remote memory, with an apparently normal attention span and concentration ability.  Cranial nerves: Facial symmetry is present. There is good sensation of the face to pinprick and soft touch bilaterally. The strength of the facial muscles and the muscles to head turning and shoulder shrug are normal bilaterally. Speech is well enunciated, no aphasia or dysarthria is noted. Extraocular movements are full. Visual fields are full. The tongue is midline, and the patient has symmetric elevation of the soft palate. No obvious hearing deficits are noted.  Motor: The motor testing reveals 5 over 5 strength of all 4 extremities. Good symmetric motor tone is noted throughout.  Sensory: Sensory testing is intact to pinprick, soft touch, vibration sensation, and position sense on all 4 extremities. No evidence of extinction is noted.  Coordination: Cerebellar testing reveals good finger-nose-finger and heel-to-shin bilaterally.  Gait and station: Gait is slightly wide-based, the patient walks with a cane.. Tandem gait is unsteady. Romberg is negative. No drift is seen.  Reflexes: Deep tendon reflexes are symmetric and normal bilaterally. Toes are  downgoing bilaterally.   MRI brain/ MRA head 01/28/15:  IMPRESSION: 1. No acute intracranial abnormality. 2. Mild chronic small vessel ischemic disease. 3. No major intracranial arterial occlusion. 4. Mild narrowing of the mid basilar artery. 5. Moderate left SCA and right P1 stenosis. 6. Left posterior communicating artery origin region infundibulum or less likely 2 mm aneurysm.  * MRI scan images were reviewed online. I agree with the written report.   Carotid doppler 01/28/15:  Summary:  - The vertebral arteries appear patent with antegrade flow. - Findings consistent with 1- 39 percent stenosis involving the right internal carotid artery. - Findings consistent with 66 - 93 percent stenosis involving the left internal carotid artery.   2D echo 01/28/15:  Study Conclusions  - Left ventricle: The cavity size was normal. Wall thickness was normal. Systolic function was normal. The estimated ejection fraction was in the range of 55% to 60%. Wall motion was normal; there were no regional wall motion abnormalities. Doppler parameters are consistent with abnormal left ventricular relaxation (grade 1 diastolic dysfunction).    Assessment/Plan:  1. TIA event  The patient had an episode of left sided weakness and numbness that resolved with TPA. The patient has done quite well since she was in the hospital. The patient will need to be on a statin drug to bring the LDL cholesterol down. I will start her on Crestor, 20 mg daily. The patient indicates that she has an annual physical examination with her primary care doctor within the next weeks. The patient has returned to her baseline activities of daily living. She will follow-up through this office on an as-needed basis. She will remain on aspirin.  Jill Alexanders MD 04/30/2015 8:00 PM  [facility] [address] [place], [place] [address]  Phone [phone] Fax  [phone]

## 2015-04-30 NOTE — Patient Instructions (Signed)
Stroke Prevention Some medical conditions and behaviors are associated with an increased chance of having a stroke. You may prevent a stroke by making healthy choices and managing medical conditions. HOW CAN I REDUCE MY RISK OF HAVING A STROKE?   Stay physically active. Get at least 30 minutes of activity on most or all days.  Do not smoke. It may also be helpful to avoid exposure to secondhand smoke.  Limit alcohol use. Moderate alcohol use is considered to be:  No more than 2 drinks per day for men.  No more than 1 drink per day for nonpregnant women.  Eat healthy foods. This involves:  Eating 5 or more servings of fruits and vegetables a day.  Making dietary changes that address high blood pressure (hypertension), high cholesterol, diabetes, or obesity.  Manage your cholesterol levels.  Making food choices that are high in fiber and low in saturated fat, trans fat, and cholesterol may control cholesterol levels.  Take any prescribed medicines to control cholesterol as directed by your health care provider.  Manage your diabetes.  Controlling your carbohydrate and sugar intake is recommended to manage diabetes.  Take any prescribed medicines to control diabetes as directed by your health care provider.  Control your hypertension.  Making food choices that are low in salt (sodium), saturated fat, trans fat, and cholesterol is recommended to manage hypertension.  Take any prescribed medicines to control hypertension as directed by your health care provider.  Maintain a healthy weight.  Reducing calorie intake and making food choices that are low in sodium, saturated fat, trans fat, and cholesterol are recommended to manage weight.  Stop drug abuse.  Avoid taking birth control pills.  Talk to your health care provider about the risks of taking birth control pills if you are over 35 years old, smoke, get migraines, or have ever had a blood clot.  Get evaluated for sleep  disorders (sleep apnea).  Talk to your health care provider about getting a sleep evaluation if you snore a lot or have excessive sleepiness.  Take medicines only as directed by your health care provider.  For some people, aspirin or blood thinners (anticoagulants) are helpful in reducing the risk of forming abnormal blood clots that can lead to stroke. If you have the irregular heart rhythm of atrial fibrillation, you should be on a blood thinner unless there is a good reason you cannot take them.  Understand all your medicine instructions.  Make sure that other conditions (such as anemia or atherosclerosis) are addressed. SEEK IMMEDIATE MEDICAL CARE IF:   You have sudden weakness or numbness of the face, arm, or leg, especially on one side of the body.  Your face or eyelid droops to one side.  You have sudden confusion.  You have trouble speaking (aphasia) or understanding.  You have sudden trouble seeing in one or both eyes.  You have sudden trouble walking.  You have dizziness.  You have a loss of balance or coordination.  You have a sudden, severe headache with no known cause.  You have new chest pain or an irregular heartbeat. Any of these symptoms may represent a serious problem that is an emergency. Do not wait to see if the symptoms will go away. Get medical help at once. Call your local emergency services (911 in [place] Do not drive yourself to the hospital. Document Released: 12/03/2004 Document Revised: 03/12/2014 Document Reviewed: 04/28/2013 [employer] Patient Information 2015 [employer]. This information is not intended to replace advice given   to you by your health care provider. Make sure you discuss any questions you have with your health care provider.  

## 2015-05-08 ENCOUNTER — Ambulatory Visit: Payer: Medicaid Other | Admitting: Neurology

## 2015-05-20 DIAGNOSIS — Z0000 Encounter for general adult medical examination without abnormal findings: Secondary | ICD-10-CM | POA: Diagnosis not present

## 2015-06-11 ENCOUNTER — Other Ambulatory Visit (HOSPITAL_COMMUNITY): Payer: Self-pay | Admitting: General Surgery

## 2015-06-11 DIAGNOSIS — Z1231 Encounter for screening mammogram for malignant neoplasm of breast: Secondary | ICD-10-CM

## 2015-07-16 ENCOUNTER — Ambulatory Visit (HOSPITAL_COMMUNITY): Payer: Medicare Other

## 2015-07-17 ENCOUNTER — Ambulatory Visit (HOSPITAL_COMMUNITY)
Admission: RE | Admit: 2015-07-17 | Discharge: 2015-07-17 | Disposition: A | Discharge status: Home / Self Care | Payer: Medicare Other | Source: Ambulatory Visit | Attending: Pulmonary Disease | Admitting: Pulmonary Disease

## 2015-07-17 ENCOUNTER — Other Ambulatory Visit (HOSPITAL_COMMUNITY): Payer: Self-pay | Admitting: Pulmonary Disease

## 2015-07-17 ENCOUNTER — Ambulatory Visit (HOSPITAL_COMMUNITY): Admission: RE | Admit: 2015-07-17 | Payer: Medicare Other | Source: Ambulatory Visit

## 2015-07-17 ENCOUNTER — Ambulatory Visit (HOSPITAL_COMMUNITY): Payer: Medicare Other

## 2015-07-17 DIAGNOSIS — Z1231 Encounter for screening mammogram for malignant neoplasm of breast: Secondary | ICD-10-CM

## 2015-08-20 DIAGNOSIS — C649 Malignant neoplasm of unspecified kidney, except renal pelvis: Secondary | ICD-10-CM | POA: Diagnosis not present

## 2015-08-20 DIAGNOSIS — I10 Essential (primary) hypertension: Secondary | ICD-10-CM | POA: Diagnosis not present

## 2015-08-20 DIAGNOSIS — Z23 Encounter for immunization: Secondary | ICD-10-CM | POA: Diagnosis not present

## 2015-08-20 DIAGNOSIS — K219 Gastro-esophageal reflux disease without esophagitis: Secondary | ICD-10-CM | POA: Diagnosis not present

## 2015-08-20 DIAGNOSIS — M797 Fibromyalgia: Secondary | ICD-10-CM | POA: Diagnosis not present

## 2015-08-27 ENCOUNTER — Other Ambulatory Visit: Payer: Self-pay | Admitting: Neurology

## 2015-11-20 DIAGNOSIS — L509 Urticaria, unspecified: Secondary | ICD-10-CM | POA: Diagnosis not present

## 2015-11-20 DIAGNOSIS — R209 Unspecified disturbances of skin sensation: Secondary | ICD-10-CM | POA: Diagnosis not present

## 2015-11-20 DIAGNOSIS — M797 Fibromyalgia: Secondary | ICD-10-CM | POA: Diagnosis not present

## 2015-11-20 DIAGNOSIS — I10 Essential (primary) hypertension: Secondary | ICD-10-CM | POA: Diagnosis not present

## 2016-01-30 DIAGNOSIS — Z87891 Personal history of nicotine dependence: Secondary | ICD-10-CM | POA: Diagnosis not present

## 2016-01-30 DIAGNOSIS — N2889 Other specified disorders of kidney and ureter: Secondary | ICD-10-CM | POA: Diagnosis not present

## 2016-01-30 DIAGNOSIS — Z905 Acquired absence of kidney: Secondary | ICD-10-CM | POA: Diagnosis not present

## 2016-01-30 DIAGNOSIS — I10 Essential (primary) hypertension: Secondary | ICD-10-CM | POA: Diagnosis not present

## 2016-01-30 DIAGNOSIS — E038 Other specified hypothyroidism: Secondary | ICD-10-CM | POA: Diagnosis not present

## 2016-01-30 DIAGNOSIS — C642 Malignant neoplasm of left kidney, except renal pelvis: Secondary | ICD-10-CM | POA: Diagnosis not present

## 2016-01-30 DIAGNOSIS — C649 Malignant neoplasm of unspecified kidney, except renal pelvis: Secondary | ICD-10-CM | POA: Diagnosis not present

## 2016-01-30 DIAGNOSIS — Z483 Aftercare following surgery for neoplasm: Secondary | ICD-10-CM | POA: Diagnosis not present

## 2016-02-18 DIAGNOSIS — I10 Essential (primary) hypertension: Secondary | ICD-10-CM | POA: Diagnosis not present

## 2016-02-18 DIAGNOSIS — C649 Malignant neoplasm of unspecified kidney, except renal pelvis: Secondary | ICD-10-CM | POA: Diagnosis not present

## 2016-02-18 DIAGNOSIS — M797 Fibromyalgia: Secondary | ICD-10-CM | POA: Diagnosis not present

## 2016-02-18 DIAGNOSIS — E039 Hypothyroidism, unspecified: Secondary | ICD-10-CM | POA: Diagnosis not present

## 2016-03-02 DIAGNOSIS — H5203 Hypermetropia, bilateral: Secondary | ICD-10-CM | POA: Diagnosis not present

## 2016-03-02 DIAGNOSIS — H524 Presbyopia: Secondary | ICD-10-CM | POA: Diagnosis not present

## 2016-03-02 DIAGNOSIS — H35033 Hypertensive retinopathy, bilateral: Secondary | ICD-10-CM | POA: Diagnosis not present

## 2016-03-02 DIAGNOSIS — H52223 Regular astigmatism, bilateral: Secondary | ICD-10-CM | POA: Diagnosis not present

## 2016-03-27 IMAGING — CT CT HEAD W/O CM
1 series · 16 of 30 positions shown, 20 images · non-contrast
Comparison: 06/02/2010 CT

CLINICAL DATA: 81-year-old female with acute left-sided numbness,
weakness and slurred speech today. Code stroke.

EXAM:
CT HEAD WITHOUT CONTRAST
TECHNIQUE: Contiguous axial images were obtained from the base of the skull
through the vertex without intravenous contrast.

[Series 2: headseq 4.8 h37s · axial · 0.43mm/px · z∈[+123,+252]mm · 16 of 30 slices shown, 20 images]
[im 2/30  brain]
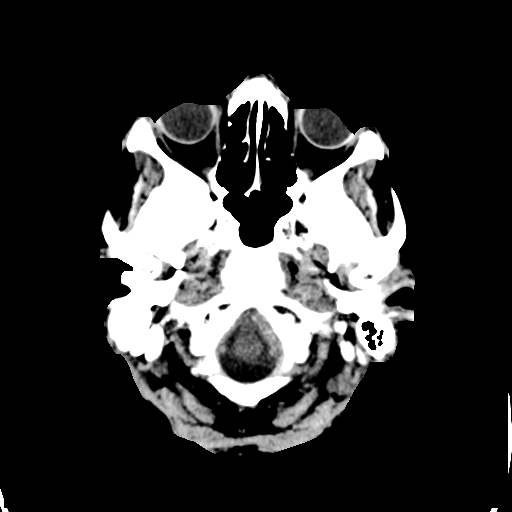
[im 2/30  bone]
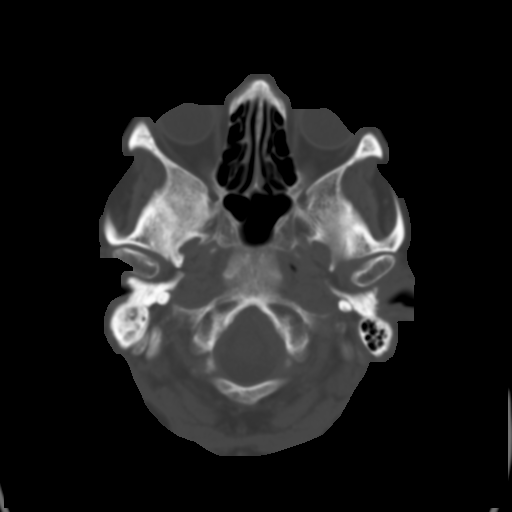
[im 4/30  brain]
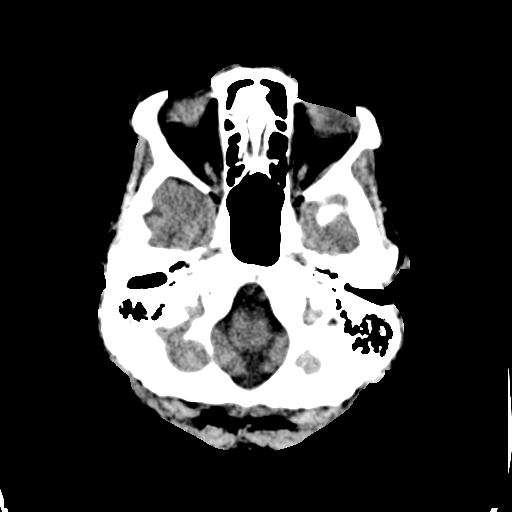
[im 6/30  brain]
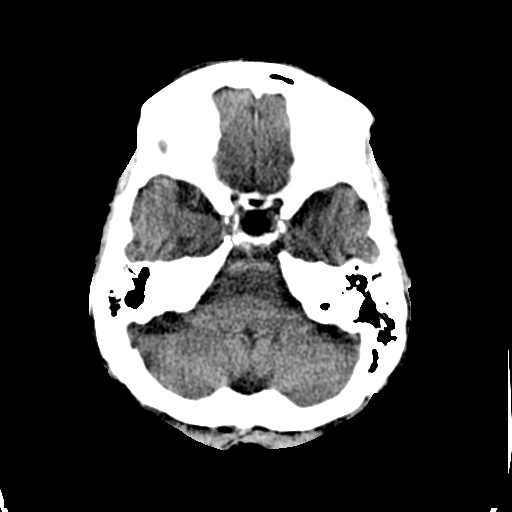
[im 8/30  brain]
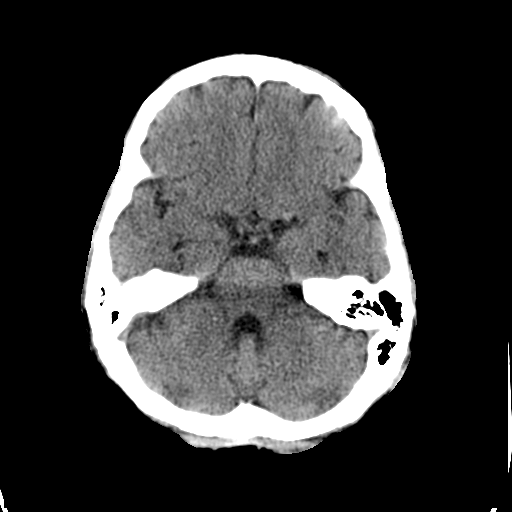
[im 9/30  brain]
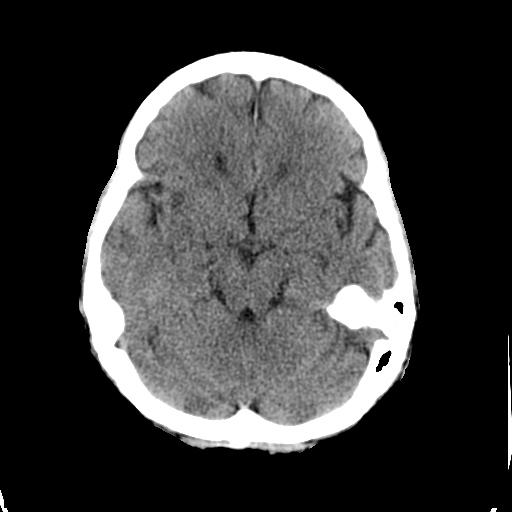
[im 9/30  bone]
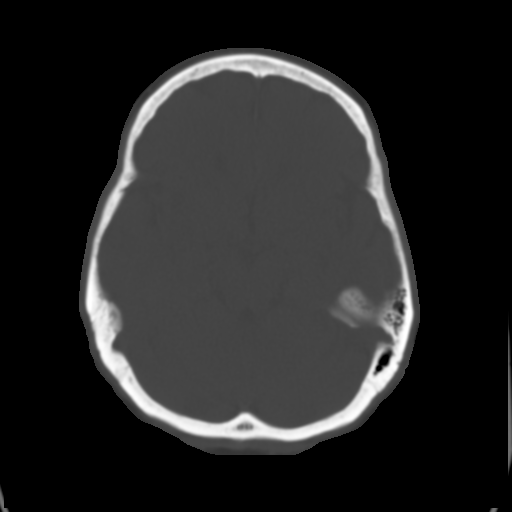
[im 11/30  brain]
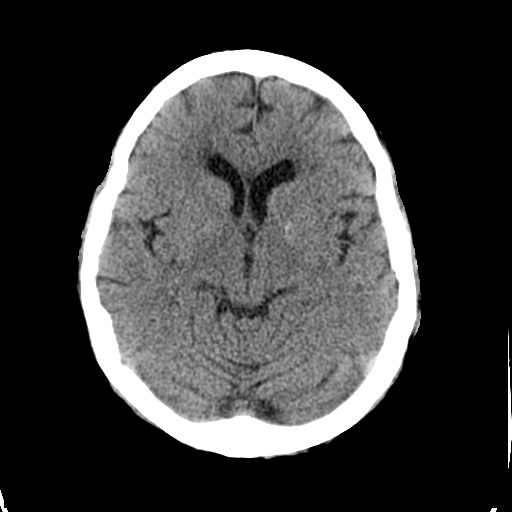
[im 13/30  brain]
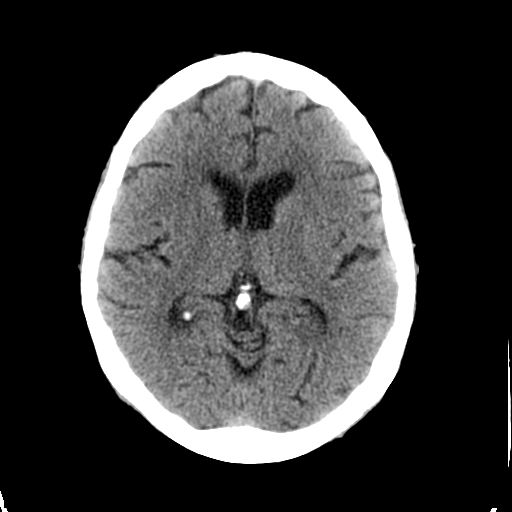
[im 15/30  brain]
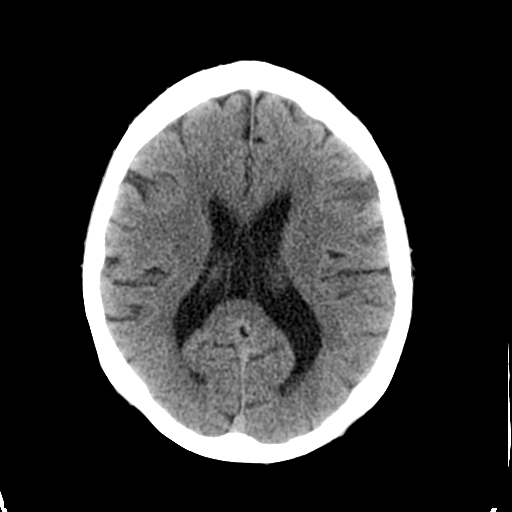
[im 16/30  brain]
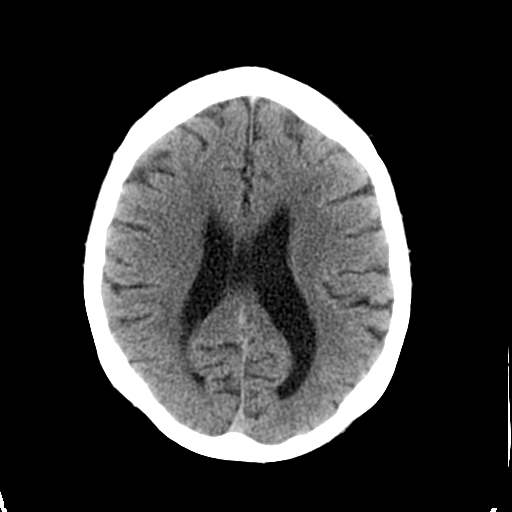
[im 16/30  bone]
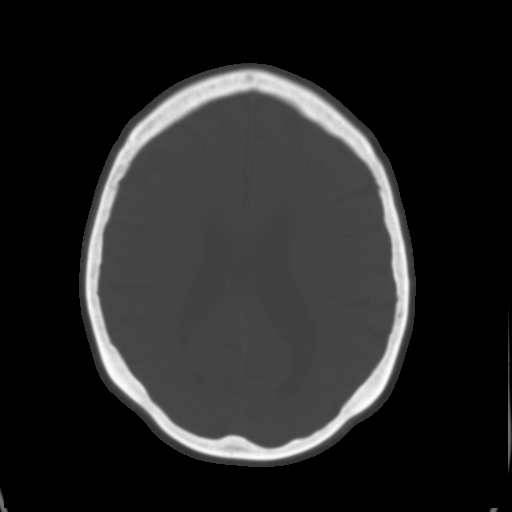
[im 18/30  brain]
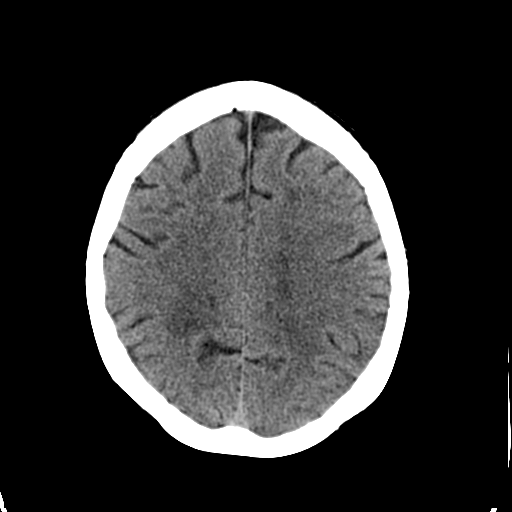
[im 20/30  brain]
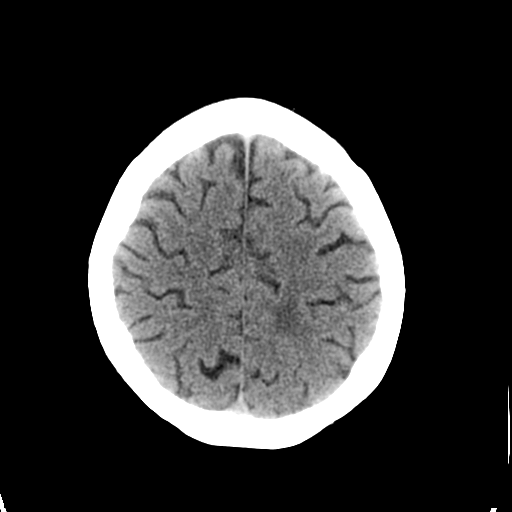
[im 22/30  brain]
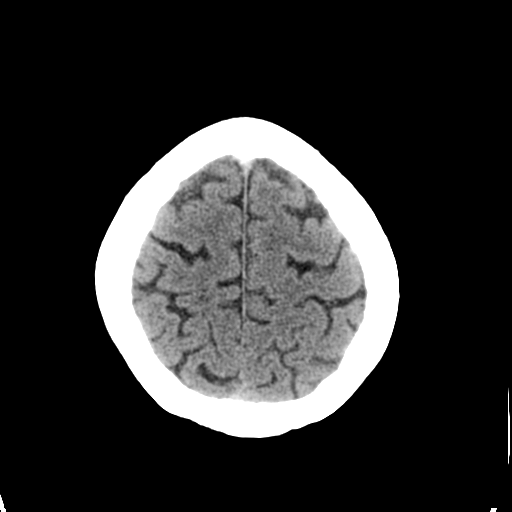
[im 23/30  brain]
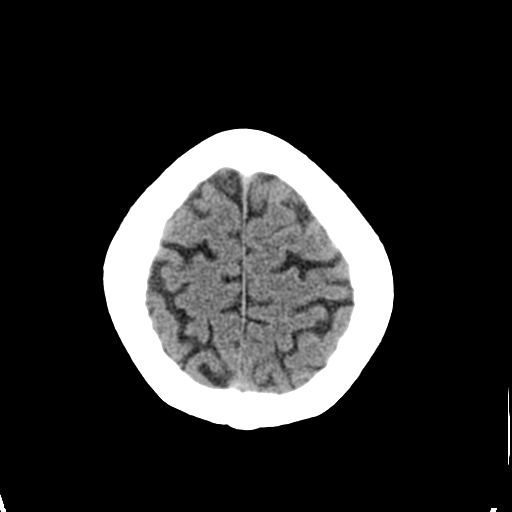
[im 23/30  bone]
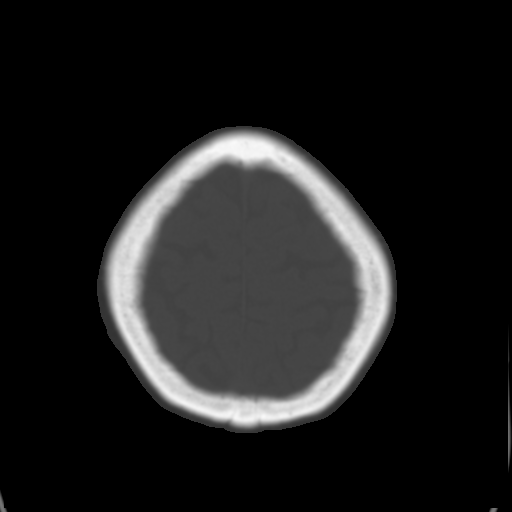
[im 25/30  brain]
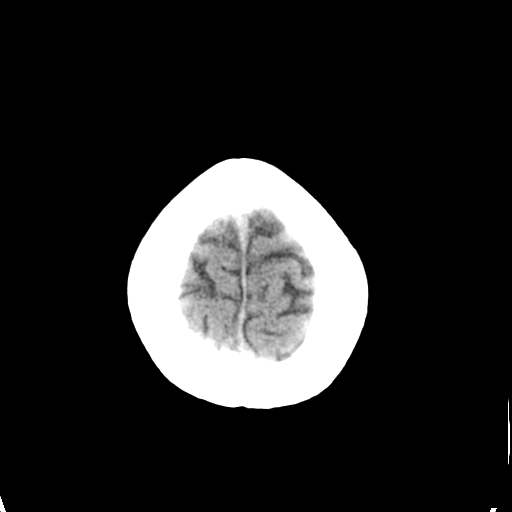
[im 27/30  brain]
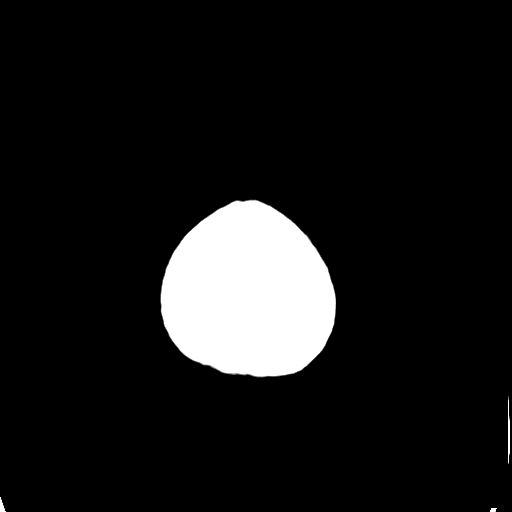
[im 29/30  brain]
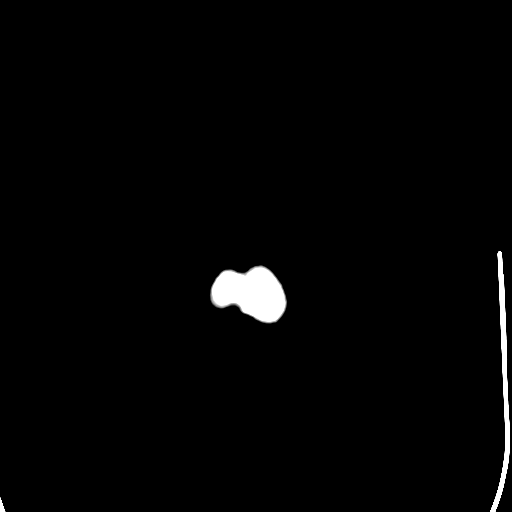

[16 of 30 positions shown; findings below may reference images not displayed]

FINDINGS: Chronic small-vessel white matter ischemic changes are again noted.

No acute intracranial abnormalities are identified, including mass
lesion or mass effect, hydrocephalus, extra-axial fluid collection,
midline shift, hemorrhage, or acute infarction.

The visualized bony calvarium is unremarkable.

A chronic right mastoid effusion is noted.
IMPRESSION: No evidence of acute intracranial abnormality.

Chronic small-vessel white matter ischemic changes.

Chronic right mastoid effusion.

Critical Value/emergent results were called by telephone at the time
of interpretation on 01/27/2015 at [DATE] to Dr. Chisoo , who
verbally acknowledged these results.

## 2016-04-27 DIAGNOSIS — M5137 Other intervertebral disc degeneration, lumbosacral region: Secondary | ICD-10-CM | POA: Diagnosis not present

## 2016-05-20 DIAGNOSIS — Z0000 Encounter for general adult medical examination without abnormal findings: Secondary | ICD-10-CM | POA: Diagnosis not present

## 2016-05-22 DIAGNOSIS — C649 Malignant neoplasm of unspecified kidney, except renal pelvis: Secondary | ICD-10-CM | POA: Diagnosis not present

## 2016-05-22 DIAGNOSIS — I10 Essential (primary) hypertension: Secondary | ICD-10-CM | POA: Diagnosis not present

## 2016-05-22 DIAGNOSIS — I639 Cerebral infarction, unspecified: Secondary | ICD-10-CM | POA: Diagnosis not present

## 2016-05-22 DIAGNOSIS — E039 Hypothyroidism, unspecified: Secondary | ICD-10-CM | POA: Diagnosis not present

## 2016-05-22 DIAGNOSIS — G459 Transient cerebral ischemic attack, unspecified: Secondary | ICD-10-CM | POA: Diagnosis not present

## 2016-05-25 ENCOUNTER — Other Ambulatory Visit: Payer: Self-pay | Admitting: Neurology

## 2016-05-26 ENCOUNTER — Other Ambulatory Visit: Payer: Self-pay | Admitting: Neurology

## 2016-06-25 ENCOUNTER — Other Ambulatory Visit: Payer: Self-pay | Admitting: Neurology

## 2016-07-17 ENCOUNTER — Ambulatory Visit (HOSPITAL_COMMUNITY)
Admission: RE | Admit: 2016-07-17 | Discharge: 2016-07-17 | Disposition: A | Discharge status: Home / Self Care | Payer: Medicare Other | Source: Ambulatory Visit | Attending: General Surgery | Admitting: General Surgery

## 2016-07-17 DIAGNOSIS — Z1231 Encounter for screening mammogram for malignant neoplasm of breast: Secondary | ICD-10-CM | POA: Diagnosis not present

## 2016-11-23 DIAGNOSIS — I10 Essential (primary) hypertension: Secondary | ICD-10-CM | POA: Diagnosis not present

## 2016-11-23 DIAGNOSIS — M1990 Unspecified osteoarthritis, unspecified site: Secondary | ICD-10-CM | POA: Diagnosis not present

## 2016-11-23 DIAGNOSIS — K210 Gastro-esophageal reflux disease with esophagitis: Secondary | ICD-10-CM | POA: Diagnosis not present

## 2016-11-23 DIAGNOSIS — M797 Fibromyalgia: Secondary | ICD-10-CM | POA: Diagnosis not present

## 2017-02-22 ENCOUNTER — Other Ambulatory Visit (HOSPITAL_COMMUNITY): Payer: Self-pay | Admitting: Pulmonary Disease

## 2017-02-22 DIAGNOSIS — I10 Essential (primary) hypertension: Secondary | ICD-10-CM | POA: Diagnosis not present

## 2017-02-22 DIAGNOSIS — K210 Gastro-esophageal reflux disease with esophagitis: Secondary | ICD-10-CM | POA: Diagnosis not present

## 2017-02-22 DIAGNOSIS — M797 Fibromyalgia: Secondary | ICD-10-CM | POA: Diagnosis not present

## 2017-02-22 DIAGNOSIS — M1990 Unspecified osteoarthritis, unspecified site: Secondary | ICD-10-CM | POA: Diagnosis not present

## 2017-02-22 DIAGNOSIS — Z780 Asymptomatic menopausal state: Secondary | ICD-10-CM

## 2017-03-04 ENCOUNTER — Ambulatory Visit (HOSPITAL_COMMUNITY)
Admission: RE | Admit: 2017-03-04 | Discharge: 2017-03-04 | Disposition: A | Discharge status: Home / Self Care | Payer: Medicare Other | Source: Ambulatory Visit | Attending: Pulmonary Disease | Admitting: Pulmonary Disease

## 2017-03-04 DIAGNOSIS — Z780 Asymptomatic menopausal state: Secondary | ICD-10-CM

## 2017-03-04 DIAGNOSIS — M85852 Other specified disorders of bone density and structure, left thigh: Secondary | ICD-10-CM | POA: Insufficient documentation

## 2017-03-04 DIAGNOSIS — N951 Menopausal and female climacteric states: Secondary | ICD-10-CM | POA: Diagnosis not present

## 2017-05-25 DIAGNOSIS — Z0000 Encounter for general adult medical examination without abnormal findings: Secondary | ICD-10-CM | POA: Diagnosis not present

## 2017-05-25 DIAGNOSIS — K219 Gastro-esophageal reflux disease without esophagitis: Secondary | ICD-10-CM | POA: Diagnosis not present

## 2017-05-25 DIAGNOSIS — I10 Essential (primary) hypertension: Secondary | ICD-10-CM | POA: Diagnosis not present

## 2017-05-25 DIAGNOSIS — M797 Fibromyalgia: Secondary | ICD-10-CM | POA: Diagnosis not present

## 2017-05-25 DIAGNOSIS — G459 Transient cerebral ischemic attack, unspecified: Secondary | ICD-10-CM | POA: Diagnosis not present

## 2017-06-08 ENCOUNTER — Other Ambulatory Visit (HOSPITAL_COMMUNITY): Payer: Self-pay | Admitting: Pulmonary Disease

## 2017-06-08 DIAGNOSIS — Z1231 Encounter for screening mammogram for malignant neoplasm of breast: Secondary | ICD-10-CM

## 2017-06-15 DIAGNOSIS — Z1211 Encounter for screening for malignant neoplasm of colon: Secondary | ICD-10-CM | POA: Diagnosis not present

## 2017-07-19 ENCOUNTER — Ambulatory Visit (HOSPITAL_COMMUNITY)
Admission: RE | Admit: 2017-07-19 | Discharge: 2017-07-19 | Disposition: A | Discharge status: Home / Self Care | Payer: Medicare Other | Source: Ambulatory Visit | Attending: Pulmonary Disease | Admitting: Pulmonary Disease

## 2017-07-19 DIAGNOSIS — N6320 Unspecified lump in the left breast, unspecified quadrant: Secondary | ICD-10-CM | POA: Diagnosis not present

## 2017-07-19 DIAGNOSIS — R928 Other abnormal and inconclusive findings on diagnostic imaging of breast: Secondary | ICD-10-CM | POA: Insufficient documentation

## 2017-07-19 DIAGNOSIS — Z1231 Encounter for screening mammogram for malignant neoplasm of breast: Secondary | ICD-10-CM | POA: Diagnosis not present

## 2017-07-26 ENCOUNTER — Other Ambulatory Visit (HOSPITAL_COMMUNITY): Payer: Self-pay | Admitting: Pulmonary Disease

## 2017-07-26 DIAGNOSIS — R928 Other abnormal and inconclusive findings on diagnostic imaging of breast: Secondary | ICD-10-CM

## 2017-08-03 ENCOUNTER — Ambulatory Visit (HOSPITAL_COMMUNITY)
Admission: RE | Admit: 2017-08-03 | Discharge: 2017-08-03 | Disposition: A | Discharge status: Home / Self Care | Payer: Medicare Other | Source: Ambulatory Visit | Attending: Pulmonary Disease | Admitting: Pulmonary Disease

## 2017-08-03 DIAGNOSIS — R928 Other abnormal and inconclusive findings on diagnostic imaging of breast: Secondary | ICD-10-CM | POA: Diagnosis not present

## 2017-08-25 DIAGNOSIS — M25551 Pain in right hip: Secondary | ICD-10-CM | POA: Diagnosis not present

## 2017-08-25 DIAGNOSIS — M797 Fibromyalgia: Secondary | ICD-10-CM | POA: Diagnosis not present

## 2017-08-25 DIAGNOSIS — Z23 Encounter for immunization: Secondary | ICD-10-CM | POA: Diagnosis not present

## 2017-08-25 DIAGNOSIS — M545 Low back pain: Secondary | ICD-10-CM | POA: Diagnosis not present

## 2017-08-25 DIAGNOSIS — I10 Essential (primary) hypertension: Secondary | ICD-10-CM | POA: Diagnosis not present

## 2017-09-23 ENCOUNTER — Encounter: Payer: Self-pay | Admitting: Internal Medicine

## 2017-11-10 ENCOUNTER — Ambulatory Visit (INDEPENDENT_AMBULATORY_CARE_PROVIDER_SITE_OTHER): Payer: Self-pay

## 2017-11-10 DIAGNOSIS — Z86010 Personal history of colonic polyps: Secondary | ICD-10-CM

## 2017-11-10 MED ORDER — PEG 3350-KCL-NA BICARB-NACL 420 G PO SOLR: 4000.0000 mL | ORAL | 0 refills | Status: DC

## 2017-11-10 NOTE — Patient Instructions (Signed)
Brenda Erickson   09-28-1933 MRN: [id number]    Procedure Date: 12/08/17 Time to register: 7:30 Place to register: [facility] Procedure Time: 8:30 Scheduled provider: [place] COLONOSCOPY WITH TRI-LYTE SPLIT PREP  Please notify us immediately if you are diabetic, take iron supplements, or if you are on Coumadin or any other blood thinners.   Please hold the following medications:   You will need to purchase 1 fleet enema and 1 box of Bisacodyl '5mg'$  tablets.   2 DAYS BEFORE PROCEDURE:  DATE: 12/06/17   DAY: Monday Begin clear liquid diet AFTER your lunch meal. NO SOLID FOODS after this point.  1 DAY BEFORE PROCEDURE:  DATE: 12/07/17   DAY: Tuesday Continue clear liquids the entire day - NO SOLID FOOD.   Diabetic medications adjustments for today: none  At 2:00 pm:  Take 2 Bisacodyl tablets.   At 4:00pm:  Start drinking your solution. Make sure you mix well per instructions on the bottle. Try to drink 1 (one) 8 ounce glass every 10-15 minutes until you have consumed HALF the jug. You should complete by 6:00pm.You must keep the left over solution refrigerated until completed next day.  Continue clear liquids. You must drink plenty of clear liquids to prevent dehyration and kidney failure. Nothing to eat or drink after midnight.  EXCEPTION: If you take medications for your heart, blood pressure or breathing, you may take these medications with a small amount of clear liquid.    DAY OF PROCEDURE:   DATE: 12/08/17   DAY: Wednesday  Diabetic medications adjustments for today: none  Five hours before your procedure time @ 3:30am:  Finish remaining amout of bowel prep, drinking 1 (one) 8 ounce glass every 10-15 minutes until complete. You have two hours to consume remaining prep.   Three hours before your procedure time '@5'$ :30am:  Nothing by mouth.   At least one hour before going to the hospital:  Give yourself one Fleet enema. You may take your morning  medications with sip of water unless we have instructed otherwise.      Please see below for Dietary Information.  CLEAR LIQUIDS INCLUDE:  Water Jello (NOT red in color)   Ice Popsicles (NOT red in color)   Tea (sugar ok, no milk/cream) Powdered fruit flavored drinks  Coffee (sugar ok, no milk/cream) Gatorade/ Lemonade/ Kool-Aid  (NOT red in color)   Juice: apple, white grape, white cranberry Soft drinks  Clear bullion, consomme, broth (fat free beef/chicken/vegetable)  Carbonated beverages (any kind)  Strained chicken noodle soup Hard Candy   Remember: Clear liquids are liquids that will allow you to see your fingers on the other side of a clear glass. Be sure liquids are NOT red in color, and not cloudy, but CLEAR.  DO NOT EAT OR DRINK ANY OF THE FOLLOWING:  Dairy products of any kind   Cranberry juice Tomato juice / V8 juice   Grapefruit juice Orange juice     Red grape juice  Do not eat any solid foods, including such foods as: cereal, oatmeal, yogurt, fruits, vegetables, creamed soups, eggs, bread, crackers, pureed foods in a blender, etc.   HELPFUL HINTS FOR DRINKING PREP SOLUTION:   Make sure prep is extremely cold. Mix and refrigerate the the morning of the prep. You may also put in the freezer.   You may try mixing some Crystal Light or Country Time Lemonade if you prefer. Mix in small amounts; add more if necessary.  Try  drinking through a straw  Rinse mouth with water or a mouthwash between glasses, to remove after-taste.  Try sipping on a cold beverage /ice/ popsicles between glasses of prep.  Place a piece of sugar-free hard candy in mouth between glasses.  If you become nauseated, try consuming smaller amounts, or stretch out the time between glasses. Stop for 30-60 minutes, then slowly start back drinking.        OTHER INSTRUCTIONS  You will need a responsible adult at least 82 years of age to accompany you and drive you home. This person must remain  in the waiting room during your procedure. The hospital will cancel your procedure if you do not have a responsible adult with you.   1. Wear loose fitting clothing that is easily removed. 2. Leave jewelry and other valuables at home.  3. Remove all body piercing jewelry and leave at home. 4. Total time from sign-in until discharge is approximately 2-3 hours. 5. You should go home directly after your procedure and rest. You can resume normal activities the day after your procedure. 6. The day of your procedure you should not:  Drive  Make legal decisions  Operate machinery  Drink alcohol  Return to work   You may call the office (Dept: [phone]) before 5:00pm, or page the doctor on call [phone]) after 5:00pm, for further instructions, if necessary.   Insurance Information YOU WILL NEED TO CHECK WITH YOUR INSURANCE COMPANY FOR THE BENEFITS OF COVERAGE YOU HAVE FOR THIS PROCEDURE.  UNFORTUNATELY, NOT ALL INSURANCE COMPANIES HAVE BENEFITS TO COVER ALL OR PART OF THESE TYPES OF PROCEDURES.  IT IS YOUR RESPONSIBILITY TO CHECK YOUR BENEFITS, HOWEVER, WE WILL BE GLAD TO ASSIST YOU WITH ANY CODES YOUR INSURANCE COMPANY MAY NEED.    PLEASE NOTE THAT MOST INSURANCE COMPANIES WILL NOT COVER A SCREENING COLONOSCOPY FOR PEOPLE UNDER THE AGE OF 50  IF YOU HAVE BCBS INSURANCE, YOU MAY HAVE BENEFITS FOR A SCREENING COLONOSCOPY BUT IF POLYPS ARE FOUND THE DIAGNOSIS WILL CHANGE AND THEN YOU MAY HAVE A DEDUCTIBLE THAT WILL NEED TO BE MET. SO PLEASE MAKE SURE YOU CHECK YOUR BENEFITS FOR A SCREENING COLONOSCOPY AS WELL AS A DIAGNOSTIC COLONOSCOPY.

## 2017-11-10 NOTE — Progress Notes (Signed)
Gastroenterology Pre-Procedure Review Do we need to hold iron?  Request Date:11/10/17 Requesting Physician: Rourk  PATIENT REVIEW QUESTIONS: The patient responded to the following health history questions as indicated:    1. Diabetes Melitis: no 2. Joint replacements in the past 12 months: no 3. Major health problems in the past 3 months: no 4. Has an artificial valve or MVP: no 5. Has a defibrillator: no 6. Has been advised in past to take antibiotics in advance of a procedure like teeth cleaning: no 7. Family history of colon cancer: no  8. Alcohol Use: no 9. History of sleep apnea: no  10. History of coronary artery or other vascular stents placed within the last 12 months: no 11. History of any prior anesthesia complications: no    MEDICATIONS & ALLERGIES:    Patient reports the following regarding taking any blood thinners:   Plavix? no Aspirin? yes (325 mg) Coumadin? no Brilinta? no Xarelto? no Eliquis? no Pradaxa? no Savaysa? no Effient? no  Patient confirms/reports the following medications:  Current Outpatient Medications  Medication Sig Dispense Refill  . Alum & Mag Hydroxide-Simeth (ANTACID LIQUID PO) Take by mouth daily.    Marland Kitchen amLODipine (NORVASC) 10 MG tablet Take 10 mg by mouth daily.     . Artificial Tear Solution (TEARS AGAIN OP) Place 1 drop into both eyes daily.    Marland Kitchen aspirin 325 MG tablet Take 1 tablet (325 mg total) by mouth daily. 30 tablet 2  . Cholecalciferol (VITAMIN D3) 2000 UNITS TABS Take 1 tablet by mouth daily.    . cloNIDine (CATAPRES) 0.1 MG tablet Take 0.1 mg by mouth at bedtime.    . fish oil-omega-3 fatty acids 1000 MG capsule Take 2 g by mouth daily.    Marland Kitchen gabapentin (NEURONTIN) 300 MG capsule Take 300 mg by mouth at bedtime.    . ibandronate (BONIVA) 150 MG tablet Take 150 mg by mouth every 30 (thirty) days. Take in the morning with a full glass of water, on an empty stomach, and do not take anything else by mouth or lie down for the next 30  min.    . IRON PO Take 65 mg by mouth daily.    Marland Kitchen levothyroxine (SYNTHROID, LEVOTHROID) 100 MCG tablet Take 100 mcg by mouth daily.    . metoprolol succinate (TOPROL-XL) 100 MG 24 hr tablet Take 100 mg by mouth daily. Take with or immediately following a meal.    . omeprazole (PRILOSEC) 20 MG capsule Take 20 mg by mouth 2 (two) times daily.    . pregabalin (LYRICA) 50 MG capsule Take 50 mg by mouth 2 (two) times daily.    . sodium chloride (OCEAN) 0.65 % SOLN nasal spray Place 1 spray into both nostrils as needed for congestion.    . sucralfate (CARAFATE) 1 G tablet Take 1 g by mouth 4 (four) times daily.    Marland Kitchen torsemide (DEMADEX) 20 MG tablet Take 20 mg by mouth daily.      No current facility-administered medications for this visit.     Patient confirms/reports the following allergies:  Allergies  Allergen Reactions  . Codeine Nausea And Vomiting    No orders of the defined types were placed in this encounter.   AUTHORIZATION INFORMATION Primary Insurance: [place] ,  [place] #:[id number] ,  Pre-Cert / Auth required: no Pre-Cert / Auth #: [facility] [place] INFORMATION: Procedure has been scheduled as follows:  Date: 12/08/17, Time: 8:30  Location: [facility]  This Gastroenterology  Pre-Precedure Review Form is being routed to the following provider(s):Eric Gordy Levan   Randall Hiss, pt is taking iron, how long do we need to hold this? )

## 2017-11-12 NOTE — Progress Notes (Signed)
Ok to schedule  Hold po iron for 7 days prior to TCS

## 2017-11-15 NOTE — Progress Notes (Signed)
Pt is aware.  

## 2017-11-25 DIAGNOSIS — I10 Essential (primary) hypertension: Secondary | ICD-10-CM | POA: Diagnosis not present

## 2017-11-25 DIAGNOSIS — R209 Unspecified disturbances of skin sensation: Secondary | ICD-10-CM | POA: Diagnosis not present

## 2017-11-25 DIAGNOSIS — E039 Hypothyroidism, unspecified: Secondary | ICD-10-CM | POA: Diagnosis not present

## 2017-11-25 DIAGNOSIS — M545 Low back pain: Secondary | ICD-10-CM | POA: Diagnosis not present

## 2017-12-08 ENCOUNTER — Encounter (HOSPITAL_COMMUNITY)
Admission: RE | Disposition: A | Discharge status: Home / Self Care | Payer: Self-pay | Source: Ambulatory Visit | Attending: Internal Medicine

## 2017-12-08 ENCOUNTER — Ambulatory Visit (HOSPITAL_COMMUNITY)
Admission: RE | Admit: 2017-12-08 | Discharge: 2017-12-08 | Disposition: A | Discharge status: Home / Self Care | Payer: Medicare Other | Source: Ambulatory Visit | Attending: Internal Medicine | Admitting: Internal Medicine

## 2017-12-08 ENCOUNTER — Other Ambulatory Visit: Payer: Self-pay

## 2017-12-08 ENCOUNTER — Encounter (HOSPITAL_COMMUNITY): Payer: Self-pay | Admitting: *Deleted

## 2017-12-08 DIAGNOSIS — Z7982 Long term (current) use of aspirin: Secondary | ICD-10-CM | POA: Insufficient documentation

## 2017-12-08 DIAGNOSIS — Z885 Allergy status to narcotic agent status: Secondary | ICD-10-CM | POA: Insufficient documentation

## 2017-12-08 DIAGNOSIS — K219 Gastro-esophageal reflux disease without esophagitis: Secondary | ICD-10-CM | POA: Diagnosis not present

## 2017-12-08 DIAGNOSIS — Z86010 Personal history of colonic polyps: Secondary | ICD-10-CM | POA: Diagnosis not present

## 2017-12-08 DIAGNOSIS — M797 Fibromyalgia: Secondary | ICD-10-CM | POA: Diagnosis not present

## 2017-12-08 DIAGNOSIS — Z8719 Personal history of other diseases of the digestive system: Secondary | ICD-10-CM | POA: Insufficient documentation

## 2017-12-08 DIAGNOSIS — Z79899 Other long term (current) drug therapy: Secondary | ICD-10-CM | POA: Insufficient documentation

## 2017-12-08 DIAGNOSIS — Z87891 Personal history of nicotine dependence: Secondary | ICD-10-CM | POA: Insufficient documentation

## 2017-12-08 DIAGNOSIS — Z09 Encounter for follow-up examination after completed treatment for conditions other than malignant neoplasm: Secondary | ICD-10-CM | POA: Diagnosis not present

## 2017-12-08 DIAGNOSIS — I10 Essential (primary) hypertension: Secondary | ICD-10-CM | POA: Insufficient documentation

## 2017-12-08 DIAGNOSIS — E785 Hyperlipidemia, unspecified: Secondary | ICD-10-CM | POA: Insufficient documentation

## 2017-12-08 DIAGNOSIS — K5730 Diverticulosis of large intestine without perforation or abscess without bleeding: Secondary | ICD-10-CM | POA: Insufficient documentation

## 2017-12-08 DIAGNOSIS — M1990 Unspecified osteoarthritis, unspecified site: Secondary | ICD-10-CM | POA: Diagnosis not present

## 2017-12-08 HISTORY — PX: COLONOSCOPY: SHX5424

## 2017-12-08 SURGERY — COLONOSCOPY
Anesthesia: Moderate Sedation

## 2017-12-08 MED ORDER — MEPERIDINE HCL 100 MG/ML IJ SOLN
INTRAMUSCULAR | Status: DC | PRN
  Administered 2017-12-08 (×2): 25 mg

## 2017-12-08 MED ORDER — SODIUM CHLORIDE 0.9 % IV SOLN
INTRAVENOUS | Status: DC
  Administered 2017-12-08: 08:00:00 via INTRAVENOUS

## 2017-12-08 MED ORDER — ONDANSETRON HCL 4 MG/2ML IJ SOLN
INTRAMUSCULAR | Status: DC | PRN
  Administered 2017-12-08: 4 mg via INTRAVENOUS

## 2017-12-08 MED ORDER — ONDANSETRON HCL 4 MG/2ML IJ SOLN
INTRAMUSCULAR | Status: AC
  Filled 2017-12-08: qty 2

## 2017-12-08 MED ORDER — MIDAZOLAM HCL 5 MG/5ML IJ SOLN
INTRAMUSCULAR | Status: AC
  Filled 2017-12-08: qty 10

## 2017-12-08 MED ORDER — MIDAZOLAM HCL 5 MG/5ML IJ SOLN
INTRAMUSCULAR | Status: DC | PRN
  Administered 2017-12-08 (×3): 1 mg via INTRAVENOUS

## 2017-12-08 MED ORDER — MEPERIDINE HCL 100 MG/ML IJ SOLN
INTRAMUSCULAR | Status: AC
  Filled 2017-12-08: qty 2

## 2017-12-08 NOTE — H&P (Signed)
@LOGO @   Primary Care Physician:  Sinda Du, MD Primary Gastroenterologist:  Dr. Gala Romney  Pre-Procedure History & Physical: HPI:  Brenda Erickson is a 84 y.o. female here for surveillance colonoscopy. History of colonic adenoma removed previously. No bowel symptoms .  Past Medical History:  Diagnosis Date  . Arthritis   . Cancer ([place])    mass on left kidney  . Esophageal reflux   . Fibromyalgia   . Hashimoto's disease   . Hashimoto's disease   . Helicobacter pylori gastritis    in remote past, s/p treatment   . HTN (hypertension)   . Hyperlipidemia LDL goal <70 01/29/2015  . Hypothyroid   . Kidney stones   . RLQ abdominal tenderness 05/16/2014    Past Surgical History:  Procedure Laterality Date  . ABDOMINAL HYSTERECTOMY     tumor removal; benign  . BLADDER SURGERY    . CHOLECYSTECTOMY    . COLONOSCOPY  05/22/2003   VVO:HYWVPX rectum/ Left-sided diverticula.  The remainder of the colonic mucosa and terminal  ileum appeared normal  . COLONOSCOPY  10/05/2012   TGG:YIRSWNI diverticulosis. Tubular adenomas   . ESOPHAGOGASTRODUODENOSCOPY  05/22/2003   RMR: Tiny distal esophageal erosions consistent with mild erosive reflux esophagitisOtherwise, normal upper gastrointestinal tract through the second part of   the duodenum  . ESOPHAGOGASTRODUODENOSCOPY  07/22/1992   RMR: Normal EGD  . ROBOTIC ASSITED PARTIAL NEPHRECTOMY     left. States was cancerous.   Marland Kitchen TOTAL HIP ARTHROPLASTY      Prior to Admission medications   Medication Sig Start Date End Date Taking? Authorizing Provider  Alum & Mag Hydroxide-Simeth (ANTACID LIQUID PO) Take by mouth daily.   Yes [provider]  amLODipine (NORVASC) 10 MG tablet Take 10 mg by mouth daily.    Yes [provider]  Artificial Tear Solution (TEARS AGAIN OP) Place 1 drop into both eyes daily.   Yes [provider]  aspirin 325 MG tablet Take 1 tablet (325 mg total) by mouth daily. 01/29/15  Yes Donzetta Starch, NP  Cholecalciferol (VITAMIN D3) 2000 UNITS TABS Take 1 tablet by mouth daily.   Yes [provider]  cloNIDine (CATAPRES) 0.1 MG tablet Take 0.1 mg by mouth at bedtime.   Yes [provider]  fish oil-omega-3 fatty acids 1000 MG capsule Take 2 g by mouth daily.   Yes [provider]  gabapentin (NEURONTIN) 300 MG capsule Take 300 mg by mouth at bedtime.   Yes [provider]  ibandronate (BONIVA) 150 MG tablet Take 150 mg by mouth every 30 (thirty) days. Take in the morning with a full glass of water, on an empty stomach, and do not take anything else by mouth or lie down for the next 30 min.   Yes [provider]  IRON PO Take 65 mg by mouth daily.   Yes [provider]  levothyroxine (SYNTHROID, LEVOTHROID) 100 MCG tablet Take 100 mcg by mouth daily.   Yes [provider]  metoprolol succinate (TOPROL-XL) 100 MG 24 hr tablet Take 100 mg by mouth daily. Take with or immediately following a meal.   Yes [provider]  omeprazole (PRILOSEC) 20 MG capsule Take 20 mg by mouth 2 (two) times daily.   Yes [provider]  polyethylene glycol-electrolytes (TRILYTE) 420 g solution Take 4,000 mLs by mouth as directed. 11/10/17  Yes Carlis Stable, NP  pregabalin (LYRICA) 50 MG capsule Take 50 mg by mouth 2 (two) times  daily.   Yes [provider]  sodium chloride (OCEAN) 0.65 % SOLN nasal spray Place 1 spray into both nostrils as needed for congestion.   Yes [provider]  sucralfate (CARAFATE) 1 G tablet Take 1 g by mouth 4 (four) times daily.   Yes [provider]  torsemide (DEMADEX) 20 MG tablet Take 20 mg by mouth daily.  07/05/13  Yes [provider]    Allergies as of 11/10/2017 - Review Complete 11/10/2017  Allergen Reaction Noted  . Codeine Nausea And Vomiting     Family History  Problem Relation Age of Onset  . Diabetes Father   . Liver cancer Mother   . Lung cancer Mother    . Diabetes Sister   . Other Son        backpain  . Other Son        staph in jugular vein  . Stroke Son   . Other Son        moya moya  . Hypertension Unknown   . Cancer Unknown        lung  . Cancer Other        cancer  . Colon cancer Neg Hx     Social History   Socioeconomic History  . Marital status: Legally Separated    Spouse name: Not on file  . Number of children: 7  . Years of education: 22  . Highest education level: Not on file  Social Needs  . Financial resource strain: Not on file  . Food insecurity - worry: Not on file  . Food insecurity - inability: Not on file  . Transportation needs - medical: Not on file  . Transportation needs - non-medical: Not on file  Occupational History  . Occupation: retired  Tobacco Use  . Smoking status: Former Smoker    Types: Cigarettes  . Smokeless tobacco: Former Systems developer    Types: Snuff  Substance and Sexual Activity  . Alcohol use: Yes    Comment: wine every now and then  . Drug use: No  . Sexual activity: Not Currently    Birth control/protection: Surgical  Other Topics Concern  . Not on file  Social History Narrative   Patient drinks 1 cup of caffeine daily.   Patient is right handed.    Review of Systems: See HPI, otherwise negative ROS  Physical Exam: BP (!) 147/73   Pulse 88   Temp 98.7 F (37.1 C) (Oral)   Resp 14   Ht 5\' 4"  (1.626 m)   Wt 150 lb (68 kg)   SpO2 99%   BMI 25.75 kg/m  General:   Alert,   pleasant and cooperative in NAD Neck:  Supple; no masses or thyromegaly. No significant cervical adenopathy. Lungs:  Clear throughout to auscultation.   No wheezes, crackles, or rhonchi. No acute distress. Heart:  Regular rate and rhythm; no murmurs, clicks, rubs,  or gallops. Abdomen: Non-distended, normal bowel sounds.  Soft and nontender without appreciable mass or hepatosplenomegaly.  Pulses:  Normal pulses noted. Extremities:  Without clubbing or edema.  Impression:  Pleasant 84 year old  lady presents for surveillance colonoscopy. History of  Colonic adenoma.  Recommendations:  I have offered the patient a surveillance colonoscopy today.  The risks, benefits, limitations, alternatives and imponderables have been reviewed with the patient. Questions have been answered. All parties are agreeable.    Notice: This dictation was prepared with Dragon dictation along with smaller phrase technology. Any transcriptional errors that result  from this process are unintentional and may not be corrected upon review.

## 2017-12-08 NOTE — Op Note (Signed)
[facility] [address] Patient Name: Brenda Erickson Procedure Date: 12/08/2017 8:13 AM MRN: [id number] Date of Birth: [birth year] Attending MD: Norvel Richards , MD CSN: [id number] Age: 84 Admit Type: Outpatient Procedure:                Colonoscopy Indications:              High risk colon cancer surveillance: Personal                            history of colonic polyps Providers:                Norvel Richards, MD, Janeece Riggers, RN, Nelma Rothman, Technician Referring MD:              Medicines:                Midazolam 3 mg IV, Meperidine 50 mg IV Complications:            No immediate complications. Estimated Blood Loss:     Estimated blood loss: none. Procedure:                Pre-Anesthesia Assessment:                           - Prior to the procedure, a History and Physical                            was performed, and patient medications and                            allergies were reviewed. The patient's tolerance of                            previous anesthesia was also reviewed. The risks                            and benefits of the procedure and the sedation                            options and risks were discussed with the patient.                            All questions were answered, and informed consent                            was obtained. Prior Anticoagulants: The patient has                            taken no previous anticoagulant or antiplatelet                            agents. ASA Grade Assessment: III - A patient with  severe systemic disease. After reviewing the risks                            and benefits, the patient was deemed in                            satisfactory condition to undergo the procedure.                           After obtaining informed consent, the colonoscope                            was passed under direct vision. Throughout the                            procedure,  the patient's blood pressure, pulse, and                            oxygen saturations were monitored continuously. The                            EC-[id number] ([id number]) scope was introduced through                            the anus and advanced to the the cecum, identified                            by appendiceal orifice and ileocecal valve. The                            ileocecal valve, appendiceal orifice, and rectum                            were photographed. The entire colon was well                            visualized. The colonoscopy was performed without                            difficulty. The patient tolerated the procedure                            well. The quality of the bowel preparation was                            adequate. Scope In: 8:45:49 AM Scope Out: 8:57:54 AM Scope Withdrawal Time: 0 hours 7 minutes 30 seconds  Total Procedure Duration: 0 hours 12 minutes 5 seconds  Findings:      The perianal and digital rectal examinations were normal.      Multiple small and large-mouthed diverticula were found in the sigmoid       colon and descending colon.      The exam was otherwise without abnormality on direct and retroflexion       views. Impression:               -  Diverticulosis in the sigmoid colon and in the                            descending colon.                           - The examination was otherwise normal on direct                            and retroflexion views.                           - No specimens collected. Moderate Sedation:      Moderate (conscious) sedation was administered by the endoscopy nurse       and supervised by the endoscopist. The following parameters were       monitored: oxygen saturation, heart rate, blood pressure, respiratory       rate, EKG, adequacy of pulmonary ventilation, and response to care.       Total physician intraservice time was 17 minutes. Recommendation:           - Patient has a contact number  available for                            emergencies. The signs and symptoms of potential                            delayed complications were discussed with the                            patient. Return to normal activities tomorrow.                            Written discharge instructions were provided to the                            patient.                           - Advance diet as tolerated.                           - Continue present medications.                           - No repeat colonoscopy due to age.                           - Return to GI clinic PRN. Procedure Code(s):        --- Professional ---                           [phone], Colonoscopy, flexible; diagnostic, including                            collection of specimen(s) by brushing or washing,  when performed (separate procedure)                           99152, Moderate sedation services provided by the                            same physician or other qualified health care                            professional performing the diagnostic or                            therapeutic service that the sedation supports,                            requiring the presence of an independent trained                            observer to assist in the monitoring of the                            patient's level of consciousness and physiological                            status; initial 15 minutes of intraservice time,                            patient age 43 years or older Diagnosis Code(s):        --- Professional ---                           Z86.010, Personal history of colonic polyps                           K57.30, Diverticulosis of large intestine without                            perforation or abscess without bleeding CPT copyright 2016 American Medical Association. All rights reserved. The codes documented in this report are preliminary and upon coder review may  be revised to  meet current compliance requirements. Cristopher Estimable. Nellie Chevalier, MD Norvel Richards, MD 12/08/2017 9:06:58 AM This report has been signed electronically. Number of Addenda: 0

## 2017-12-08 NOTE — Discharge Instructions (Signed)
Colonoscopy Discharge Instructions  Read the instructions outlined below and refer to this sheet in the next few weeks. These discharge instructions provide you with general information on caring for yourself after you leave the hospital. Your doctor may also give you specific instructions. While your treatment has been planned according to the most current medical practices available, unavoidable complications occasionally occur. If you have any problems or questions after discharge, call Dr. Gala Romney at [phone]. ACTIVITY  You may resume your regular activity, but move at a slower pace for the next 24 hours.   Take frequent rest periods for the next 24 hours.   Walking will help get rid of the air and reduce the bloated feeling in your belly (abdomen).   No driving for 24 hours (because of the medicine (anesthesia) used during the test).    Do not sign any important legal documents or operate any machinery for 24 hours (because of the anesthesia used during the test).  NUTRITION  Drink plenty of fluids.   You may resume your normal diet as instructed by your doctor.   Begin with a light meal and progress to your normal diet. Heavy or fried foods are harder to digest and may make you feel sick to your stomach (nauseated).   Avoid alcoholic beverages for 24 hours or as instructed.  MEDICATIONS  You may resume your normal medications unless your doctor tells you otherwise.  WHAT YOU CAN EXPECT TODAY  Some feelings of bloating in the abdomen.   Passage of more gas than usual.   Spotting of blood in your stool or on the toilet paper.  IF YOU HAD POLYPS REMOVED DURING THE COLONOSCOPY:  No aspirin products for 7 days or as instructed.   No alcohol for 7 days or as instructed.   Eat a soft diet for the next 24 hours.  FINDING OUT THE RESULTS OF YOUR TEST Not all test results are available during your visit. If your test results are not back during the visit, make an appointment  with your caregiver to find out the results. Do not assume everything is normal if you have not heard from your caregiver or the medical facility. It is important for you to follow up on all of your test results.  SEEK IMMEDIATE MEDICAL ATTENTION IF:  You have more than a spotting of blood in your stool.   Your belly is swollen (abdominal distention).   You are nauseated or vomiting.   You have a temperature over 101.   You have abdominal pain or discomfort that is severe or gets worse throughout the day.    Diverticulosis information provided  No future colonoscopy unless new  Symptoms develop   Diverticulosis Diverticulosis is a condition that develops when small pouches (diverticula) form in the wall of the large intestine (colon). The colon is where water is absorbed and stool is formed. The pouches form when the inside layer of the colon pushes through weak spots in the outer layers of the colon. You may have a few pouches or many of them. What are the causes? The cause of this condition is not known. What increases the risk? The following factors may make you more likely to develop this condition:  Being older than age 61. Your risk for this condition increases with age. Diverticulosis is rare among people younger than age 47. By age 59, many people have it.  Eating a low-fiber diet.  Having frequent constipation.  Being overweight.  Not getting enough  exercise.  Smoking.  Taking over-the-counter pain medicines, like aspirin and ibuprofen.  Having a family history of diverticulosis.  What are the signs or symptoms? In most people, there are no symptoms of this condition. If you do have symptoms, they may include:  Bloating.  Cramps in the abdomen.  Constipation or diarrhea.  Pain in the lower left side of the abdomen.  How is this diagnosed? This condition is most often diagnosed during an exam for other colon problems. Because diverticulosis usually has no  symptoms, it often cannot be diagnosed independently. This condition may be diagnosed by:  Using a flexible scope to examine the colon (colonoscopy).  Taking an X-ray of the colon after dye has been put into the colon (barium enema).  Doing a CT scan.  How is this treated? You may not need treatment for this condition if you have never developed an infection related to diverticulosis. If you have had an infection before, treatment may include:  Eating a high-fiber diet. This may include eating more fruits, vegetables, and grains.  Taking a fiber supplement.  Taking a live bacteria supplement (probiotic).  Taking medicine to relax your colon.  Taking antibiotic medicines.  Follow these instructions at home:  Drink 6-8 glasses of water or more each day to prevent constipation.  Try not to strain when you have a bowel movement.  If you have had an infection before: ? Eat more fiber as directed by your health care provider or your diet and nutrition specialist (dietitian). ? Take a fiber supplement or probiotic, if your health care provider approves.  Take over-the-counter and prescription medicines only as told by your health care provider.  If you were prescribed an antibiotic, take it as told by your health care provider. Do not stop taking the antibiotic even if you start to feel better.  Keep all follow-up visits as told by your health care provider. This is important. Contact a health care provider if:  You have pain in your abdomen.  You have bloating.  You have cramps.  You have not had a bowel movement in 3 days. Get help right away if:  Your pain gets worse.  Your bloating becomes very bad.  You have a fever or chills, and your symptoms suddenly get worse.  You vomit.  You have bowel movements that are bloody or black.  You have bleeding from your rectum. Summary  Diverticulosis is a condition that develops when small pouches (diverticula) form in the  wall of the large intestine (colon).  You may have a few pouches or many of them.  This condition is most often diagnosed during an exam for other colon problems.  If you have had an infection related to diverticulosis, treatment may include increasing the fiber in your diet, taking supplements, or taking medicines. This information is not intended to replace advice given to you by your health care provider. Make sure you discuss any questions you have with your health care provider. Document Released: 07/23/2004 Document Revised: 09/14/2016 Document Reviewed: 09/14/2016 Elsevier Interactive Patient Education  2017 [employer].

## 2017-12-10 ENCOUNTER — Encounter (HOSPITAL_COMMUNITY): Payer: Self-pay | Admitting: Internal Medicine

## 2018-01-18 DIAGNOSIS — N281 Cyst of kidney, acquired: Secondary | ICD-10-CM | POA: Diagnosis not present

## 2018-01-18 DIAGNOSIS — Z905 Acquired absence of kidney: Secondary | ICD-10-CM | POA: Diagnosis not present

## 2018-01-18 DIAGNOSIS — N2889 Other specified disorders of kidney and ureter: Secondary | ICD-10-CM | POA: Diagnosis not present

## 2018-01-18 DIAGNOSIS — Z85528 Personal history of other malignant neoplasm of kidney: Secondary | ICD-10-CM | POA: Diagnosis not present

## 2018-02-24 DIAGNOSIS — C649 Malignant neoplasm of unspecified kidney, except renal pelvis: Secondary | ICD-10-CM | POA: Diagnosis not present

## 2018-02-24 DIAGNOSIS — J301 Allergic rhinitis due to pollen: Secondary | ICD-10-CM | POA: Diagnosis not present

## 2018-02-24 DIAGNOSIS — M797 Fibromyalgia: Secondary | ICD-10-CM | POA: Diagnosis not present

## 2018-02-24 DIAGNOSIS — I10 Essential (primary) hypertension: Secondary | ICD-10-CM | POA: Diagnosis not present

## 2018-05-23 DIAGNOSIS — I639 Cerebral infarction, unspecified: Secondary | ICD-10-CM | POA: Diagnosis not present

## 2018-05-23 DIAGNOSIS — M1990 Unspecified osteoarthritis, unspecified site: Secondary | ICD-10-CM | POA: Diagnosis not present

## 2018-05-23 DIAGNOSIS — I10 Essential (primary) hypertension: Secondary | ICD-10-CM | POA: Diagnosis not present

## 2018-05-23 DIAGNOSIS — Z0000 Encounter for general adult medical examination without abnormal findings: Secondary | ICD-10-CM | POA: Diagnosis not present

## 2018-05-23 DIAGNOSIS — K219 Gastro-esophageal reflux disease without esophagitis: Secondary | ICD-10-CM | POA: Diagnosis not present

## 2018-05-26 DIAGNOSIS — Z0000 Encounter for general adult medical examination without abnormal findings: Secondary | ICD-10-CM | POA: Diagnosis not present

## 2018-06-28 ENCOUNTER — Other Ambulatory Visit (HOSPITAL_COMMUNITY): Payer: Self-pay | Admitting: Pulmonary Disease

## 2018-06-28 DIAGNOSIS — Z1231 Encounter for screening mammogram for malignant neoplasm of breast: Secondary | ICD-10-CM

## 2018-08-08 ENCOUNTER — Ambulatory Visit (HOSPITAL_COMMUNITY)
Admission: RE | Admit: 2018-08-08 | Discharge: 2018-08-08 | Disposition: A | Discharge status: Home / Self Care | Payer: Medicare Other | Source: Ambulatory Visit | Attending: Pulmonary Disease | Admitting: Pulmonary Disease

## 2018-08-08 DIAGNOSIS — Z1231 Encounter for screening mammogram for malignant neoplasm of breast: Secondary | ICD-10-CM | POA: Diagnosis not present

## 2018-08-10 DIAGNOSIS — H5203 Hypermetropia, bilateral: Secondary | ICD-10-CM | POA: Diagnosis not present

## 2018-08-10 DIAGNOSIS — I10 Essential (primary) hypertension: Secondary | ICD-10-CM | POA: Diagnosis not present

## 2018-08-10 DIAGNOSIS — H25813 Combined forms of age-related cataract, bilateral: Secondary | ICD-10-CM | POA: Diagnosis not present

## 2018-08-10 DIAGNOSIS — H35033 Hypertensive retinopathy, bilateral: Secondary | ICD-10-CM | POA: Diagnosis not present

## 2018-08-23 DIAGNOSIS — H5213 Myopia, bilateral: Secondary | ICD-10-CM | POA: Diagnosis not present

## 2018-10-10 DIAGNOSIS — Z23 Encounter for immunization: Secondary | ICD-10-CM | POA: Diagnosis not present

## 2018-10-10 DIAGNOSIS — M545 Low back pain: Secondary | ICD-10-CM | POA: Diagnosis not present

## 2018-10-10 DIAGNOSIS — M1990 Unspecified osteoarthritis, unspecified site: Secondary | ICD-10-CM | POA: Diagnosis not present

## 2018-10-10 DIAGNOSIS — R209 Unspecified disturbances of skin sensation: Secondary | ICD-10-CM | POA: Diagnosis not present

## 2018-10-10 DIAGNOSIS — I10 Essential (primary) hypertension: Secondary | ICD-10-CM | POA: Diagnosis not present

## 2018-10-27 DIAGNOSIS — H524 Presbyopia: Secondary | ICD-10-CM | POA: Diagnosis not present

## 2018-10-27 DIAGNOSIS — H52223 Regular astigmatism, bilateral: Secondary | ICD-10-CM | POA: Diagnosis not present

## 2019-01-09 DIAGNOSIS — M1990 Unspecified osteoarthritis, unspecified site: Secondary | ICD-10-CM | POA: Diagnosis not present

## 2019-01-09 DIAGNOSIS — M797 Fibromyalgia: Secondary | ICD-10-CM | POA: Diagnosis not present

## 2019-01-09 DIAGNOSIS — I10 Essential (primary) hypertension: Secondary | ICD-10-CM | POA: Diagnosis not present

## 2019-01-09 DIAGNOSIS — K219 Gastro-esophageal reflux disease without esophagitis: Secondary | ICD-10-CM | POA: Diagnosis not present

## 2019-01-13 ENCOUNTER — Emergency Department (HOSPITAL_COMMUNITY)
Admission: EM | Admit: 2019-01-13 | Discharge: 2019-01-13 | Disposition: A | Discharge status: Home / Self Care | Payer: Medicare Other | Attending: Emergency Medicine | Admitting: Emergency Medicine

## 2019-01-13 ENCOUNTER — Other Ambulatory Visit: Payer: Self-pay

## 2019-01-13 ENCOUNTER — Emergency Department (HOSPITAL_COMMUNITY): Payer: Medicare Other

## 2019-01-13 ENCOUNTER — Encounter (HOSPITAL_COMMUNITY): Payer: Self-pay | Admitting: *Deleted

## 2019-01-13 DIAGNOSIS — K5792 Diverticulitis of intestine, part unspecified, without perforation or abscess without bleeding: Secondary | ICD-10-CM | POA: Insufficient documentation

## 2019-01-13 DIAGNOSIS — Z79899 Other long term (current) drug therapy: Secondary | ICD-10-CM | POA: Insufficient documentation

## 2019-01-13 DIAGNOSIS — Z8673 Personal history of transient ischemic attack (TIA), and cerebral infarction without residual deficits: Secondary | ICD-10-CM | POA: Diagnosis not present

## 2019-01-13 DIAGNOSIS — Z85528 Personal history of other malignant neoplasm of kidney: Secondary | ICD-10-CM | POA: Diagnosis not present

## 2019-01-13 DIAGNOSIS — Z7982 Long term (current) use of aspirin: Secondary | ICD-10-CM | POA: Insufficient documentation

## 2019-01-13 DIAGNOSIS — K625 Hemorrhage of anus and rectum: Secondary | ICD-10-CM | POA: Diagnosis not present

## 2019-01-13 DIAGNOSIS — R531 Weakness: Secondary | ICD-10-CM | POA: Diagnosis not present

## 2019-01-13 DIAGNOSIS — K5790 Diverticulosis of intestine, part unspecified, without perforation or abscess without bleeding: Secondary | ICD-10-CM | POA: Diagnosis not present

## 2019-01-13 DIAGNOSIS — K921 Melena: Secondary | ICD-10-CM | POA: Diagnosis not present

## 2019-01-13 DIAGNOSIS — E039 Hypothyroidism, unspecified: Secondary | ICD-10-CM | POA: Diagnosis not present

## 2019-01-13 HISTORY — DX: Diverticulosis of intestine, part unspecified, without perforation or abscess without bleeding: K57.90

## 2019-01-13 LAB — COMPREHENSIVE METABOLIC PANEL
ALK PHOS: 63 U/L (ref 38–126)
ALT: 32 U/L (ref 0–44)
AST: 35 U/L (ref 15–41)
Albumin: 4 g/dL (ref 3.5–5.0)
Anion gap: 9 (ref 5–15)
BUN: 15 mg/dL (ref 8–23)
CALCIUM: 9.3 mg/dL (ref 8.9–10.3)
CHLORIDE: 94 mmol/L — AB (ref 98–111)
CO2: 33 mmol/L — ABNORMAL HIGH (ref 22–32)
CREATININE: 0.87 mg/dL (ref 0.44–1.00)
GFR calc non Af Amer: >60 mL/min (ref >60)
GLUCOSE: 110 mg/dL — AB (ref 70–99)
Potassium: 2.8 mmol/L — ABNORMAL LOW (ref 3.5–5.1)
Sodium: 136 mmol/L (ref 135–145)
TOTAL PROTEIN: 7.3 g/dL (ref 6.5–8.1)
Total Bilirubin: 0.7 mg/dL (ref 0.3–1.2)

## 2019-01-13 LAB — CBC
HCT: 43.5 % (ref 36.0–46.0)
HEMATOCRIT: 45.7 % (ref 36.0–46.0)
HEMOGLOBIN: 13.8 g/dL (ref 12.0–15.0)
HEMOGLOBIN: 14.4 g/dL (ref 12.0–15.0)
MCH: 28.9 pg (ref 26.0–34.0)
MCH: 28.8 pg (ref 26.0–34.0)
MCHC: 31.7 g/dL (ref 30.0–36.0)
MCHC: 31.5 g/dL (ref 30.0–36.0)
MCV: 91.2 fL (ref 80.0–100.0)
MCV: 91.4 fL (ref 80.0–100.0)
NRBC: 0 % (ref 0.0–0.2)
PLATELETS: 329 10*3/uL (ref 150–400)
Platelets: 306 10*3/uL (ref 150–400)
RBC: 4.77 MIL/uL (ref 3.87–5.11)
RBC: 5 MIL/uL (ref 3.87–5.11)
RDW: 13.4 % (ref 11.5–15.5)
RDW: 13.5 % (ref 11.5–15.5)
WBC: 6.7 10*3/uL (ref 4.0–10.5)
WBC: 7.3 10*3/uL (ref 4.0–10.5)
nRBC: 0 % (ref 0.0–0.2)

## 2019-01-13 LAB — TYPE AND SCREEN
ABO/RH(D): O POS
ANTIBODY SCREEN: NEGATIVE

## 2019-01-13 LAB — LIPASE, BLOOD: Lipase: 14 U/L (ref 11–51)

## 2019-01-13 LAB — DIFFERENTIAL
ABS IMMATURE GRANULOCYTES: 0.01 10*3/uL (ref 0.00–0.07)
Basophils Absolute: 0.1 10*3/uL (ref 0.0–0.1)
Basophils Relative: 1 %
Eosinophils Absolute: 0.3 10*3/uL (ref 0.0–0.5)
Eosinophils Relative: 5 %
IMMATURE GRANULOCYTES: 0 %
LYMPHS ABS: 2.3 10*3/uL (ref 0.7–4.0)
LYMPHS PCT: 35 %
Monocytes Absolute: 0.5 10*3/uL (ref 0.1–1.0)
Monocytes Relative: 8 %
NEUTROS ABS: 3.4 10*3/uL (ref 1.7–7.7)
Neutrophils Relative %: 51 %

## 2019-01-13 LAB — PROTIME-INR
INR: 1 (ref 0.8–1.2)
PROTHROMBIN TIME: 12.7 s (ref 11.4–15.2)

## 2019-01-13 LAB — POC OCCULT BLOOD, ED: Fecal Occult Bld: POSITIVE — AB

## 2019-01-13 MED ORDER — POTASSIUM CHLORIDE CRYS ER 20 MEQ PO TBCR
40.0000 meq | EXTENDED_RELEASE_TABLET | Freq: Once | ORAL | Status: AC
  Administered 2019-01-13: 40 meq via ORAL
  Filled 2019-01-13: qty 2

## 2019-01-13 MED ORDER — METRONIDAZOLE 500 MG PO TABS: 500.0000 mg | ORAL_TABLET | Freq: Three times a day (TID) | ORAL | 0 refills | Status: DC

## 2019-01-13 MED ORDER — CIPROFLOXACIN HCL 500 MG PO TABS: 500.0000 mg | ORAL_TABLET | Freq: Two times a day (BID) | ORAL | 0 refills | Status: DC

## 2019-01-13 MED ORDER — IOHEXOL 300 MG/ML  SOLN: 100.0000 mL | Freq: Once | INTRAMUSCULAR | Status: DC | PRN

## 2019-01-13 MED ORDER — IOHEXOL 300 MG/ML  SOLN
75.0000 mL | Freq: Once | INTRAMUSCULAR | Status: AC | PRN
  Administered 2019-01-13: 75 mL via INTRAVENOUS

## 2019-01-13 MED ORDER — METRONIDAZOLE 500 MG PO TABS
500.0000 mg | ORAL_TABLET | Freq: Once | ORAL | Status: AC
  Administered 2019-01-13: 500 mg via ORAL
  Filled 2019-01-13: qty 1

## 2019-01-13 MED ORDER — CIPROFLOXACIN HCL 250 MG PO TABS
500.0000 mg | ORAL_TABLET | Freq: Once | ORAL | Status: AC
  Administered 2019-01-13: 500 mg via ORAL
  Filled 2019-01-13: qty 2

## 2019-01-13 NOTE — ED Notes (Signed)
Pt reports constipation and reports that she has passed blood  She has no blood on her allergies

## 2019-01-13 NOTE — ED Notes (Signed)
Pt does not accept blood due to her religious beliefs

## 2019-01-13 NOTE — ED Triage Notes (Signed)
Pt c/o dark red colored rectal bleeding that started last night. Denies abdominal pain and prior GI bleed. Pt reports generalized weakness. Hx of diverticulosis.

## 2019-01-13 NOTE — Discharge Instructions (Signed)
Take the prescriptions as directed.  Increase your fluids and eat a bland diet for the next several days. Call your regular medical doctor and your GI doctor on Monday to schedule a follow up appointment next week.  Return to the Emergency Department immediately sooner if worsening.

## 2019-01-13 NOTE — ED Notes (Signed)
Patient transported to X-ray 

## 2019-01-13 NOTE — ED Provider Notes (Signed)
[facility] EMERGENCY DEPARTMENT Provider Note   CSN: [id number] Arrival date & time: 01/13/19  1124    History   Chief Complaint Chief Complaint  Patient presents with  . Rectal Bleeding    HPI Brenda Erickson is a 85 y.o. female.     HPI Pt was seen at 1230. Per pt, c/o gradual onset and persistence of multiple intermittent episodes of "rectal bleeding" that began last night. Has been associated with generalized abd "discomfort." Describes the blood as "dark red." Pt describes her stools as "dark, but I take iron." States she noticed her symptoms after having a BM and "passing gas" last night and today. Denies N/V, no back pain, no CP/SOB, no cough, no fevers, no rash.    GI: [facility] Past Medical History:  Diagnosis Date  . Arthritis   . Cancer ([place])    mass on left kidney  . Diverticulosis   . Esophageal reflux   . Fibromyalgia   . Hashimoto's disease   . Hashimoto's disease   . Helicobacter pylori gastritis    in remote past, s/p treatment   . HTN (hypertension)   . Hyperlipidemia LDL goal <70 01/29/2015  . Hypothyroid   . Kidney stones   . RLQ abdominal tenderness 05/16/2014    Patient Active Problem List   Diagnosis Date Noted  . Hyperlipidemia LDL goal <70 01/29/2015  . TIA (transient ischemic attack) 01/27/2015  . RLQ abdominal tenderness 05/16/2014  . Change in bowel habits 09/21/2012  . Acquired trigger finger 05/25/2012  . TRIGGER FINGER DEFORMITY 06/25/2010  . GOITER 04/22/2009  . Hypothyroidism 04/22/2009  . Essential hypertension 04/22/2009  . GERD 04/22/2009  . DIVERTICULOSIS, COLON 04/22/2009  . CONSTIPATION 04/22/2009  . HIP PAIN, RIGHT 04/22/2009  . Myalgia and myositis 04/22/2009  . EPIGASTRIC PAIN 04/22/2009  . HELICOBACTER PYLORI GASTRITIS, HX OF 04/22/2009  . TRIGGER FINGER 02/11/2009  . HAND PAIN 02/11/2009    Past Surgical History:  Procedure Laterality Date  . ABDOMINAL HYSTERECTOMY     tumor removal; benign  . BLADDER SURGERY     . CHOLECYSTECTOMY    . COLONOSCOPY  05/22/2003   HDQ:QIWLNL rectum/ Left-sided diverticula.  The remainder of the colonic mucosa and terminal  ileum appeared normal  . COLONOSCOPY  10/05/2012   GXQ:JJHERDE diverticulosis. Tubular adenomas   . COLONOSCOPY N/A 12/08/2017   Procedure: COLONOSCOPY;  Surgeon: Daneil Dolin, MD;  Location: [facility];  Service: Endoscopy;  Laterality: N/A;  8:30  . ESOPHAGOGASTRODUODENOSCOPY  05/22/2003   RMR: Tiny distal esophageal erosions consistent with mild erosive reflux esophagitisOtherwise, normal upper gastrointestinal tract through the second part of   the duodenum  . ESOPHAGOGASTRODUODENOSCOPY  07/22/1992   RMR: Normal EGD  . ROBOTIC ASSITED PARTIAL NEPHRECTOMY     left. States was cancerous.   Marland Kitchen TOTAL HIP ARTHROPLASTY       OB History    Gravida  11   Para  7   Term      Preterm      AB  4   Living  7     SAB  4   TAB      Ectopic      Multiple      Live Births               Home Medications    Prior to Admission medications   Medication Sig Start Date End Date Taking? Authorizing Provider  Alum & Mag Hydroxide-Simeth (ANTACID LIQUID PO) Take by  mouth daily.    [provider]  amLODipine (NORVASC) 10 MG tablet Take 10 mg by mouth daily.     [provider]  Artificial Tear Solution (TEARS AGAIN OP) Place 1 drop into both eyes daily.    [provider]  aspirin 325 MG tablet Take 1 tablet (325 mg total) by mouth daily. 01/29/15   Donzetta Starch, NP  Cholecalciferol (VITAMIN D3) 2000 UNITS TABS Take 1 tablet by mouth daily.    [provider]  cloNIDine (CATAPRES) 0.1 MG tablet Take 0.1 mg by mouth at bedtime.    [provider]  fish oil-omega-3 fatty acids 1000 MG capsule Take 2 g by mouth daily.    [provider]  gabapentin (NEURONTIN) 300 MG capsule Take 300 mg by mouth at bedtime.    [provider]  ibandronate (BONIVA) 150 MG tablet Take 150 mg  by mouth every 30 (thirty) days. Take in the morning with a full glass of water, on an empty stomach, and do not take anything else by mouth or lie down for the next 30 min.    [provider]  IRON PO Take 65 mg by mouth daily.    [provider]  levothyroxine (SYNTHROID, LEVOTHROID) 100 MCG tablet Take 100 mcg by mouth daily.    [provider]  metoprolol succinate (TOPROL-XL) 100 MG 24 hr tablet Take 100 mg by mouth daily. Take with or immediately following a meal.    [provider]  omeprazole (PRILOSEC) 20 MG capsule Take 20 mg by mouth 2 (two) times daily.    [provider]  polyethylene glycol-electrolytes (TRILYTE) 420 g solution Take 4,000 mLs by mouth as directed. 11/10/17   Carlis Stable, NP  pregabalin (LYRICA) 50 MG capsule Take 50 mg by mouth 2 (two) times daily.    [provider]  sodium chloride (OCEAN) 0.65 % SOLN nasal spray Place 1 spray into both nostrils as needed for congestion.    [provider]  sucralfate (CARAFATE) 1 G tablet Take 1 g by mouth 4 (four) times daily.    [provider]  torsemide (DEMADEX) 20 MG tablet Take 20 mg by mouth daily.  07/05/13   [provider]    Family History Family History  Problem Relation Age of Onset  . Diabetes Father   . Liver cancer Mother   . Lung cancer Mother   . Diabetes Sister   . Other Son        backpain  . Other Son        staph in jugular vein  . Stroke Son   . Other Son        moya moya  . Hypertension Other   . Cancer Other        lung  . Cancer Other        cancer  . Colon cancer Neg Hx     Social History Social History   Tobacco Use  . Smoking status: Former Smoker    Types: Cigarettes  . Smokeless tobacco: Former Systems developer    Types: Snuff  Substance Use Topics  . Alcohol use: Yes    Comment: wine every now and then  . Drug use: No     Allergies   Codeine and Other   Review of Systems Review of Systems ROS:  Statement: All systems negative except as marked or noted in the HPI; Constitutional: Negative for fever and chills. ; ; Eyes:  Negative for eye pain, redness and discharge. ; ; ENMT: Negative for ear pain, hoarseness, nasal congestion, sinus pressure and sore throat. ; ; Cardiovascular: Negative for chest pain, palpitations, diaphoresis, dyspnea and peripheral edema. ; ; Respiratory: Negative for cough, wheezing and stridor. ; ; Gastrointestinal: Negative for nausea, vomiting, diarrhea, hematemesis, jaundice and +abd pain, rectal bleeding. . ; ; Genitourinary: Negative for dysuria, flank pain and hematuria. ; ; Musculoskeletal: Negative for back pain and neck pain. Negative for swelling and trauma.; ; Skin: Negative for pruritus, rash, abrasions, blisters, bruising and skin lesion.; ; Neuro: Negative for headache, lightheadedness and neck stiffness. Negative for weakness, altered level of consciousness, altered mental status, extremity weakness, paresthesias, involuntary movement, seizure and syncope.       Physical Exam Updated Vital Signs BP 139/70   Pulse 64   Temp (!) 97.4 F (36.3 C)   Resp 20   Ht 5\' 4"  (1.626 m)   Wt 62.6 kg   SpO2 97%   BMI 23.69 kg/m     Patient Vitals for the past 24 hrs:  BP Temp Pulse Resp SpO2 Height Weight  01/13/19 1530 (!) 149/66 - 68 - 98 % - -  01/13/19 1524 - - 76 - 98 % - -  01/13/19 1500 (!) 130/55 - 80 - 99 % - -  01/13/19 1143 139/70 (!) 97.4 F (36.3 C) 64 20 97 % - -  01/13/19 1142 - - - - - 5\' 4"  (1.626 m) 62.6 kg    13:24 Orthostatic Vital Signs FS  Orthostatic Lying   BP- Lying: 125/65  Pulse- Lying: 66      Orthostatic Sitting  BP- Sitting: 113/73  Pulse- Sitting: 64      Orthostatic Standing at 0 minutes  BP- Standing at 0 minutes: 117/77  Pulse- Standing at 0 minutes: 70     Physical Exam 1235: Physical examination:  Nursing notes reviewed; Vital signs and O2 SAT reviewed;  Constitutional: Well developed, Well nourished,  Well hydrated, In no acute distress; Head:  Normocephalic, atraumatic; Eyes: EOMI, PERRL, No scleral icterus; ENMT: Mouth and pharynx normal, Mucous membranes moist; Neck: Supple, Full range of motion, No lymphadenopathy; Cardiovascular: Regular rate and rhythm, No gallop; Respiratory: Breath sounds clear & equal bilaterally, No wheezes.  Speaking full sentences with ease, Normal respiratory effort/excursion; Chest: Nontender, Movement normal; Abdomen: Soft, +very mild diffuse tenderness to palp. No rebound or guarding. Nondistended, Normal bowel sounds. Rectal exam performed w/permission of pt and ED RN chaperone present.  Anal tone normal.  Non-tender, mucus/no stool in rectal vault, heme positive.  No fissures, +external hemorrhoids without thrombosis or bleeding, no palp masses.; Genitourinary: No CVA tenderness; Extremities: Peripheral pulses normal, No tenderness, No edema, No calf edema or asymmetry.; Neuro: AA&Ox3, Major CN grossly intact.  Speech clear. No gross focal motor or sensory deficits in extremities.; Skin: Color normal, Warm, Dry.   ED Treatments / Results  Labs (all labs ordered are listed, but only abnormal results are displayed)   EKG None  Radiology   Procedures Procedures (including critical care time)  Medications Ordered in ED Medications - No data to display   Initial Impression / Assessment and Plan / ED Course  I have reviewed the triage vital signs and the nursing notes.  Pertinent labs & imaging results that were available during my care of the patient were reviewed by me and considered in my medical decision making (see chart for details).    MDM Reviewed: previous chart, nursing  note and vitals Reviewed previous: labs Interpretation: labs, x-ray and CT scan    Results for orders placed or performed during the hospital encounter of 01/13/19  Comprehensive metabolic panel  Result Value Ref Range   Sodium 136 135 - 145 mmol/L   Potassium 2.8 (L) 3.5  - 5.1 mmol/L   Chloride 94 (L) 98 - 111 mmol/L   CO2 33 (H) 22 - 32 mmol/L   Glucose, Bld 110 (H) 70 - 99 mg/dL   BUN 15 8 - 23 mg/dL   Creatinine, Ser 0.87 0.44 - 1.00 mg/dL   Calcium 9.3 8.9 - 10.3 mg/dL   Total Protein 7.3 6.5 - 8.1 g/dL   Albumin 4.0 3.5 - 5.0 g/dL   AST 35 15 - 41 U/L   ALT 32 0 - 44 U/L   Alkaline Phosphatase 63 38 - 126 U/L   Total Bilirubin 0.7 0.3 - 1.2 mg/dL   GFR calc non Af Amer >60 >60 mL/min   GFR calc Af Amer >60 >60 mL/min   Anion gap 9 5 - 15  CBC  Result Value Ref Range   WBC 6.7 4.0 - 10.5 K/uL   RBC 4.77 3.87 - 5.11 MIL/uL   Hemoglobin 13.8 12.0 - 15.0 g/dL   HCT 43.5 36.0 - 46.0 %   MCV 91.2 80.0 - 100.0 fL   MCH 28.9 26.0 - 34.0 pg   MCHC 31.7 30.0 - 36.0 g/dL   RDW 13.4 11.5 - 15.5 %   Platelets 329 150 - 400 K/uL   nRBC 0.0 0.0 - 0.2 %  Lipase, blood  Result Value Ref Range   Lipase 14 11 - 51 U/L  Protime-INR  Result Value Ref Range   Prothrombin Time 12.7 11.4 - 15.2 seconds   INR 1.0 0.8 - 1.2  Differential  Result Value Ref Range   Neutrophils Relative % 51 %   Neutro Abs 3.4 1.7 - 7.7 K/uL   Lymphocytes Relative 35 %   Lymphs Abs 2.3 0.7 - 4.0 K/uL   Monocytes Relative 8 %   Monocytes Absolute 0.5 0.1 - 1.0 K/uL   Eosinophils Relative 5 %   Eosinophils Absolute 0.3 0.0 - 0.5 K/uL   Basophils Relative 1 %   Basophils Absolute 0.1 0.0 - 0.1 K/uL   Immature Granulocytes 0 %   Abs Immature Granulocytes 0.01 0.00 - 0.07 K/uL  CBC  Result Value Ref Range   WBC 7.3 4.0 - 10.5 K/uL   RBC 5.00 3.87 - 5.11 MIL/uL   Hemoglobin 14.4 12.0 - 15.0 g/dL   HCT 45.7 36.0 - 46.0 %   MCV 91.4 80.0 - 100.0 fL   MCH 28.8 26.0 - 34.0 pg   MCHC 31.5 30.0 - 36.0 g/dL   RDW 13.5 11.5 - 15.5 %   Platelets 306 150 - 400 K/uL   nRBC 0.0 0.0 - 0.2 %  POC occult blood, ED  Result Value Ref Range   Fecal Occult Bld POSITIVE (A) NEGATIVE  Type and screen [facility]  Result Value Ref Range   ABO/RH(D) O POS    Antibody Screen NEG     Sample Expiration      01/16/2019 Performed at [facility], [address] [place], [place] [address]    Dg Chest 2 View Result Date: 01/13/2019 CLINICAL DATA:  Dark red colored rectal bleeding. Generalized weakness EXAM: CHEST - 2 VIEW COMPARISON:  None. FINDINGS: The heart size and mediastinal contours are within normal limits.  Asymmetric elevation of right hemidiaphragm. No pleural effusions or edema. No airspace opacities. Both lungs are clear. The visualized skeletal structures are unremarkable. IMPRESSION: No active cardiopulmonary disease. Electronically Signed   By: Kerby Moors M.D.   On: 01/13/2019 13:46   Ct Abdomen Pelvis W Contrast Result Date: 01/13/2019 CLINICAL DATA:  Rectal bleeding. Prior GI bleed. History of diverticulosis. Melena. EXAM: CT ABDOMEN AND PELVIS WITH CONTRAST TECHNIQUE: Multidetector CT imaging of the abdomen and pelvis was performed using the standard protocol following bolus administration of intravenous contrast. CONTRAST:  32mL OMNIPAQUE IOHEXOL 300 MG/ML  SOLN COMPARISON:  None. FINDINGS: Lower chest: The lung bases are clear without focal nodule, mass or airspace disease. Heart size is normal. Calcifications are present at the aortic valve. Hepatobiliary: No focal liver abnormality is seen. Status post cholecystectomy. No biliary dilatation. Pancreas: The pancreas is somewhat atrophic. Focal lesions are present. Spleen: Normal in size without focal abnormality. Adrenals/Urinary Tract: Adrenal glands are normal bilaterally. Exophytic cyst at the upper pole of the right kidney measures 1.3 cm. There is some thinning of the cortex bilaterally. No stone or other mass lesion is present. The ureters are mildly dilated bilaterally. The urinary bladder is mildly distended as well. Stomach/Bowel: The stomach and duodenum are within normal limits. Small bowel is unremarkable. Terminal ileum is normal. Appendix is visualized and normal. The ascending transverse colon are  normal. Diverticular changes are present in the distal descending and sigmoid colon. There is some inflammatory change at the junction of the descending sigmoid colon. No fluid collection or free air is present. Rectum is within normal limits. Vascular/Lymphatic: Atherosclerotic calcifications are present in the aorta and branch vessels without aneurysm. No significant adenopathy is present. Reproductive: Uterus and bilateral adnexa are unremarkable. Other: No abdominal wall hernia or abnormality. No abdominopelvic ascites. Musculoskeletal: Vertebral body heights are maintained. Grade 1 anterolisthesis is present at L2-3. Grade 1/2 anterolisthesis at L4-5 is degenerative. Asymmetric endplate sclerotic changes are present on the right at L2-3 and L3-4 and on the left at L4-5. Bony pelvis is within normal limits. Right hip arthroplasty is noted. The left hip is within normal limits. IMPRESSION: 1. Diverticular changes in the descending and sigmoid colon with mild inflammatory changes suggesting early diverticulitis. No complicating features are present. 2. No other acute or focal abnormality to explain melena. 3. Cholecystectomy. 4. Dilated urinary bladder and bilateral ureters suggesting some element of urinary bladder outlet obstruction. No obstructing lesion is evident. 5.  Aortic Atherosclerosis (ICD10-I70.0). 6. Degenerative changes of the lumbar spine with grade 1/2 anterolisthesis at L4-5, uncovering of a broad-based disc protrusion, and associated stenosis. Electronically Signed   By: San Morelle M.D.   On: 01/13/2019 15:38     1740:  H/H stable x2 today. CT scan with mild diverticulitis; dose of PO abx given. Potassium repleted PO. Pt has tol PO well without N/V. Pt states she does not accept blood transfusions d/t religious beliefs. Not orthostatic during VS.  No clear indication for admission at this time.  T/C returned from GI Dr. Laural Golden, case discussed, including:  HPI, pertinent PM/SHx,  VS/PE, dx testing, ED course and treatment:  Agrees regarding no clear indication for admission at this time, continue abx, f/u office on Monday. Dx and testing, as well as d/w GI MD, d/w pt and family.  Questions answered.  Verb understanding, agreeable to d/c home with outpt f/u.      Final Clinical Impressions(s) / ED Diagnoses   Final diagnoses:  None  ED Discharge Orders    None       Francine Graven, DO 01/15/19 2152

## 2019-01-23 DIAGNOSIS — M797 Fibromyalgia: Secondary | ICD-10-CM | POA: Diagnosis not present

## 2019-01-23 DIAGNOSIS — K219 Gastro-esophageal reflux disease without esophagitis: Secondary | ICD-10-CM | POA: Diagnosis not present

## 2019-01-23 DIAGNOSIS — I10 Essential (primary) hypertension: Secondary | ICD-10-CM | POA: Diagnosis not present

## 2019-01-23 DIAGNOSIS — K5792 Diverticulitis of intestine, part unspecified, without perforation or abscess without bleeding: Secondary | ICD-10-CM | POA: Diagnosis not present

## 2019-02-06 DIAGNOSIS — K5792 Diverticulitis of intestine, part unspecified, without perforation or abscess without bleeding: Secondary | ICD-10-CM | POA: Diagnosis not present

## 2019-05-30 DIAGNOSIS — Z0000 Encounter for general adult medical examination without abnormal findings: Secondary | ICD-10-CM | POA: Diagnosis not present

## 2019-06-05 DIAGNOSIS — E876 Hypokalemia: Secondary | ICD-10-CM | POA: Diagnosis not present

## 2019-06-05 DIAGNOSIS — I10 Essential (primary) hypertension: Secondary | ICD-10-CM | POA: Diagnosis not present

## 2019-06-05 DIAGNOSIS — R209 Unspecified disturbances of skin sensation: Secondary | ICD-10-CM | POA: Diagnosis not present

## 2019-06-05 DIAGNOSIS — K219 Gastro-esophageal reflux disease without esophagitis: Secondary | ICD-10-CM | POA: Diagnosis not present

## 2019-06-05 DIAGNOSIS — Z0000 Encounter for general adult medical examination without abnormal findings: Secondary | ICD-10-CM | POA: Diagnosis not present

## 2019-08-21 ENCOUNTER — Other Ambulatory Visit (HOSPITAL_COMMUNITY): Payer: Self-pay | Admitting: Pulmonary Disease

## 2019-08-21 DIAGNOSIS — Z1231 Encounter for screening mammogram for malignant neoplasm of breast: Secondary | ICD-10-CM

## 2019-08-28 DIAGNOSIS — Z23 Encounter for immunization: Secondary | ICD-10-CM | POA: Diagnosis not present

## 2019-09-04 ENCOUNTER — Other Ambulatory Visit: Payer: Self-pay

## 2019-09-04 ENCOUNTER — Ambulatory Visit (HOSPITAL_COMMUNITY)
Admission: RE | Admit: 2019-09-04 | Discharge: 2019-09-04 | Disposition: A | Discharge status: Home / Self Care | Payer: Medicare Other | Source: Ambulatory Visit | Attending: Pulmonary Disease | Admitting: Pulmonary Disease

## 2019-09-04 DIAGNOSIS — Z1231 Encounter for screening mammogram for malignant neoplasm of breast: Secondary | ICD-10-CM | POA: Diagnosis not present

## 2019-12-04 ENCOUNTER — Telehealth: Payer: Self-pay | Admitting: *Deleted

## 2019-12-04 NOTE — Telephone Encounter (Signed)
Mariann Laster Navarrette called the pt is going to be a new pt they are concerned as the pt is having signs of dementia and she cant remember anything. They are concerned but dont want to say anything in front of her as the pt may get upset. They just wanted to give Jarrett Soho a heads up. She can be reached at [phone]

## 2019-12-04 NOTE — Telephone Encounter (Signed)
FYI

## 2019-12-06 ENCOUNTER — Other Ambulatory Visit: Payer: Self-pay

## 2019-12-06 ENCOUNTER — Ambulatory Visit (INDEPENDENT_AMBULATORY_CARE_PROVIDER_SITE_OTHER): Payer: Medicare Other | Admitting: Family Medicine

## 2019-12-06 ENCOUNTER — Encounter: Payer: Self-pay | Admitting: Family Medicine

## 2019-12-06 VITALS — BP 108/78 | HR 67 | Temp 98.0°F | Resp 15 | Ht 64.0 in | Wt 147.0 lb

## 2019-12-06 DIAGNOSIS — E785 Hyperlipidemia, unspecified: Secondary | ICD-10-CM | POA: Diagnosis not present

## 2019-12-06 DIAGNOSIS — E063 Autoimmune thyroiditis: Secondary | ICD-10-CM

## 2019-12-06 DIAGNOSIS — K219 Gastro-esophageal reflux disease without esophagitis: Secondary | ICD-10-CM | POA: Diagnosis not present

## 2019-12-06 DIAGNOSIS — M797 Fibromyalgia: Secondary | ICD-10-CM | POA: Diagnosis not present

## 2019-12-06 DIAGNOSIS — I10 Essential (primary) hypertension: Secondary | ICD-10-CM | POA: Diagnosis not present

## 2019-12-06 DIAGNOSIS — R6889 Other general symptoms and signs: Secondary | ICD-10-CM

## 2019-12-06 NOTE — Progress Notes (Signed)
Subjective:  Patient ID: Brenda Erickson, female    DOB: [birth year]  Age: 86 y.o. MRN: [id number]  CC:  Chief Complaint  Patient presents with  . New Patient (Initial Visit)    establish care      HPI  HPI Ms Capote is an 86 year old female patient who presents today to establish care with the patient of Dr. Luan Pulling.  Has history including hypertension, TIA, GERD, hypothyroidism, fibromyalgia, renal cancer among others.  Declines having any problems today to discuss.  Reports that she is doing well she has no concerns is taking all of her medications as directed.  She reports she got her flu vaccine in October.  She reports that she is up-to-date on her pneumonia vaccines as well.  She is up-to-date on her colonoscopy and mammogram.  She does not drive herself anymore her family drives her where she needs to go.  She has 7 children her son brought her today.  And is in the waiting area for her.  Prior to her appointment family did notify us that they were concerned about her memory and questioning today she reports that she has had some memory changes and notes that sometimes she is forgetful.  Is willing to have a memory test done.  Today patient denies signs and symptoms of COVID 19 infection including fever, chills, cough, shortness of breath, and headache. Past Medical, Surgical, Social History, Allergies, and Medications have been Reviewed.   Past Medical History:  Diagnosis Date  . Acquired trigger finger 05/25/2012  . Arthritis   . Cancer ([place])    mass on left kidney  . Change in bowel habits 09/21/2012  . CONSTIPATION 04/22/2009   Qualifier: Diagnosis of  By: Christ Kick    . Diverticulosis   . EPIGASTRIC PAIN 04/22/2009   Qualifier: Diagnosis of  By: Christ Kick    . Esophageal reflux   . Fibromyalgia   . Hashimoto's disease   . Hashimoto's disease   . Helicobacter pylori gastritis    in remote past, s/p treatment   . HELICOBACTER PYLORI  GASTRITIS, HX OF 04/22/2009   Qualifier: Diagnosis of  By: Christ Kick    . HIP PAIN, RIGHT 04/22/2009   Qualifier: Diagnosis of  By: Christ Kick    . History of gastroesophageal reflux (GERD) 05/16/2013  . HTN (hypertension)   . Hyperlipidemia LDL goal <70 01/29/2015  . Hypothyroid   . Hypothyroidism 04/22/2009   Qualifier: Diagnosis of  By: Christ Kick    . Kidney stones   . Low back pain radiating to both legs 05/16/2013  . Renal mass, left 05/02/2013  . RLQ abdominal tenderness 05/16/2014  . RLQ abdominal tenderness 05/16/2014  . TRIGGER FINGER 02/11/2009   Qualifier: Diagnosis of  By: Aline Brochure MD, Dorothyann Peng      Current Meds  Medication Sig  . amLODipine (NORVASC) 10 MG tablet Take 10 mg by mouth daily.   . Artificial Tear Solution (TEARS AGAIN OP) Place 1 drop into both eyes daily.  Marland Kitchen aspirin 81 MG chewable tablet Chew 81 mg by mouth daily.  . Cholecalciferol (VITAMIN D3) 2000 UNITS TABS Take 1 tablet by mouth daily.  . cloNIDine (CATAPRES) 0.1 MG tablet Take 0.1 mg by mouth at bedtime.  . fish oil-omega-3 fatty acids 1000 MG capsule Take 2 g by mouth daily.  Marland Kitchen gabapentin (NEURONTIN) 300 MG capsule Take 300 mg by mouth at bedtime.  . IRON PO Take 65 mg by mouth daily.  Marland Kitchen levothyroxine (  SYNTHROID, LEVOTHROID) 100 MCG tablet Take 100 mcg by mouth daily.  . metoprolol succinate (TOPROL-XL) 100 MG 24 hr tablet Take 100 mg by mouth daily. Take with or immediately following a meal.  . omeprazole (PRILOSEC) 20 MG capsule Take 20 mg by mouth 2 (two) times daily.  . rosuvastatin (CRESTOR) 10 MG tablet Take 10 mg by mouth daily.   . sodium chloride (OCEAN) 0.65 % SOLN nasal spray Place 1 spray into both nostrils as needed for congestion.  . sucralfate (CARAFATE) 1 G tablet Take 1 g by mouth 4 (four) times daily.  Marland Kitchen torsemide (DEMADEX) 20 MG tablet Take 20 mg by mouth daily.     ROS:  Review of Systems  HENT: Negative.   Eyes: Negative.   Respiratory: Negative.     Cardiovascular: Negative.   Gastrointestinal: Negative.   Genitourinary: Negative.   Musculoskeletal: Negative.   Skin: Negative.   Neurological: Negative.   Endo/Heme/Allergies: Negative.   Psychiatric/Behavioral: Negative.   All other systems reviewed and are negative.   Objective:   Today's Vitals: BP 108/78   Pulse 67   Temp 98 F (36.7 C) (Temporal)   Resp 15   Ht 5\' 4"  (1.626 m)   Wt 147 lb (66.7 kg)   SpO2 94%   BMI 25.23 kg/m  Vitals with BMI 12/06/2019 01/13/2019 01/13/2019  Height 5\' 4"  - -  Weight 147 lbs - -  BMI 0000000 - -  Systolic [id number]  Diastolic 78 61 61  Pulse 67 63 -     Physical Exam Vitals and nursing note reviewed.  Constitutional:      Appearance: Normal appearance. She is well-developed and well-groomed. She is obese.  HENT:     Head: Normocephalic and atraumatic.     Right Ear: External ear normal.     Left Ear: External ear normal.     Nose: Nose normal.     Mouth/Throat:     Mouth: Mucous membranes are moist.     Pharynx: Oropharynx is clear.  Eyes:     General:        Right eye: No discharge.        Left eye: No discharge.     Conjunctiva/sclera: Conjunctivae normal.  Cardiovascular:     Rate and Rhythm: Normal rate and regular rhythm.     Pulses: Normal pulses.     Heart sounds: Normal heart sounds.  Pulmonary:     Effort: Pulmonary effort is normal.     Breath sounds: Normal breath sounds.  Musculoskeletal:        General: Normal range of motion.     Right wrist: Deformity present.     Cervical back: Normal range of motion and neck supple.     Comments: Deformity of the right wrist secondary to break in the past. Uses a cane.  Skin:    General: Skin is warm.  Neurological:     General: No focal deficit present.     Mental Status: She is alert and oriented to person, place, and time.  Psychiatric:        Attention and Perception: Attention normal.        Mood and Affect: Mood and affect normal.        Speech: Speech  normal.        Behavior: Behavior normal. Behavior is cooperative.        Thought Content: Thought content normal.        Cognition and Memory: Cognition  normal. She exhibits impaired recent memory.        Judgment: Judgment normal.     6CIT Screen 12/06/2019  What Year? 0 points  What month? 0 points  What time? 0 points  Count back from 20 2 points  Months in reverse 0 points  Repeat phrase 2 points  Total Score 4    Assessment   1. Hypertension, unspecified type   2. Gastroesophageal reflux disease, unspecified whether esophagitis present   3. Acquired autoimmune hypothyroidism   4. Hyperlipidemia LDL goal <70   5. Fibromyalgia   6. Forgetfulness     Tests ordered No orders of the defined types were placed in this encounter.    Plan: Please see assessment and plan per problem list above.   No orders of the defined types were placed in this encounter.   Patient to follow-up in 2 weeks MMSE  Perlie Mayo, NP

## 2019-12-06 NOTE — Patient Instructions (Addendum)
Happy New Year! May you have a year filled with hope, love, happiness and laughter.  I appreciate the opportunity to provide you with care for your health and wellness. Today we discussed: establish care  Follow up: 2 weeks for MMSE   No labs or referrals today  Continue all current medications. I will review chart to find out when labs are due.  Please continue to practice social distancing to keep you, your family, and our community safe.  If you must go out, please wear a mask and practice good handwashing.  It was a pleasure to see you and I look forward to continuing to work together on your health and well-being. Please do not hesitate to call the office if you need care or have questions about your care.  Have a wonderful day and week. With Gratitude, Cherly Beach, DNP, AGNP-BC

## 2019-12-06 NOTE — Assessment & Plan Note (Signed)
6CIT for today planning to bring her back in in 2 weeks for a Mini-Mental status exam.  She reports having forgetfulness family is also noticing it.  Patient reports that she does not use her stove at home.  No longer drives.  But she still does live alone.  Pending what MMSE findings we will adjust or add any medications if they are appropriate

## 2019-12-06 NOTE — Assessment & Plan Note (Signed)
Reports taking her Crestor without any issues.  Will be getting updated labs continue all medications at this time.  Encourage low-fat diet.

## 2019-12-06 NOTE — Assessment & Plan Note (Signed)
Need to look at most recent labs.  Will be getting updated labs in the near future continue current medications at this time no signs and symptoms of hypothyroidism today.

## 2019-12-06 NOTE — Assessment & Plan Note (Signed)
No complaints today at this time.  Continue Neurontin at this time.

## 2019-12-06 NOTE — Assessment & Plan Note (Signed)
Controlled, reports medications are helpful.  We will continue them at this time.

## 2019-12-06 NOTE — Assessment & Plan Note (Signed)
Controlled, continue current medications.

## 2019-12-20 ENCOUNTER — Ambulatory Visit (INDEPENDENT_AMBULATORY_CARE_PROVIDER_SITE_OTHER): Payer: Medicare Other | Admitting: Family Medicine

## 2019-12-20 ENCOUNTER — Other Ambulatory Visit: Payer: Self-pay

## 2019-12-20 ENCOUNTER — Encounter: Payer: Self-pay | Admitting: Family Medicine

## 2019-12-20 VITALS — BP 130/60 | HR 70 | Temp 97.9°F | Resp 15 | Ht 64.0 in | Wt 145.0 lb

## 2019-12-20 DIAGNOSIS — R6889 Other general symptoms and signs: Secondary | ICD-10-CM

## 2019-12-20 DIAGNOSIS — M1990 Unspecified osteoarthritis, unspecified site: Secondary | ICD-10-CM | POA: Diagnosis not present

## 2019-12-20 DIAGNOSIS — I10 Essential (primary) hypertension: Secondary | ICD-10-CM

## 2019-12-20 NOTE — Progress Notes (Signed)
Subjective:  Patient ID: Brenda Erickson, female    DOB: 02-Dec-1932  Age: 86 y.o. MRN: [id number]  CC:  Chief Complaint  Patient presents with  . MMSE      HPI  HPI [name] established with me January 27 of 2021.  Prior to her appointment her family notified me that they were concerned about her memory and questioning whether or not she needed to have any medication.  She reports that she thought she had some memory changes and sometimes she is forgetful.  But overall she thought she was doing okay.  She was willing to have a memory test done and this is what she presents for today.  She denies leaving appliances on in her home.  She does live by herself.  She denies getting lost or losing or misplacing items.  She does reports that she has some mild forgetfulness where she might forget what she was doing or when she walked into her room but overall she reports that she does well.   Today patient denies signs and symptoms of COVID 19 infection including fever, chills, cough, shortness of breath, and headache. Past Medical, Surgical, Social History, Allergies, and Medications have been Reviewed.   Past Medical History:  Diagnosis Date  . Acquired trigger finger 05/25/2012  . Arthritis   . Cancer ([place])    mass on left kidney  . Change in bowel habits 09/21/2012  . CONSTIPATION 04/22/2009   Qualifier: Diagnosis of  By: Christ Kick    . Diverticulosis   . DIVERTICULOSIS, COLON 04/22/2009   Qualifier: Diagnosis of  By: Christ Kick    . EPIGASTRIC PAIN 04/22/2009   Qualifier: Diagnosis of  By: Christ Kick    . Esophageal reflux   . Fibromyalgia   . Hashimoto's disease   . Hashimoto's disease   . Helicobacter pylori gastritis    in remote past, s/p treatment   . HELICOBACTER PYLORI GASTRITIS, HX OF 04/22/2009   Qualifier: Diagnosis of  By: Christ Kick    . HIP PAIN, RIGHT 04/22/2009   Qualifier: Diagnosis of  By: Christ Kick    . History of  gastroesophageal reflux (GERD) 05/16/2013  . HTN (hypertension)   . Hyperlipidemia LDL goal <70 01/29/2015  . Hypothyroid   . Hypothyroidism 04/22/2009   Qualifier: Diagnosis of  By: Christ Kick    . Kidney stones   . Low back pain radiating to both legs 05/16/2013  . Renal mass, left 05/02/2013  . RLQ abdominal tenderness 05/16/2014  . RLQ abdominal tenderness 05/16/2014  . TIA (transient ischemic attack) 01/27/2015  . TRIGGER FINGER 02/11/2009   Qualifier: Diagnosis of  By: Aline Brochure MD, Dorothyann Peng    . TRIGGER FINGER DEFORMITY 06/25/2010   Qualifier: Diagnosis of  By: Aline Brochure MD, Dorothyann Peng      Current Meds  Medication Sig  . amLODipine (NORVASC) 10 MG tablet Take 10 mg by mouth daily.   . Artificial Tear Solution (TEARS AGAIN OP) Place 1 drop into both eyes daily.  Marland Kitchen aspirin 81 MG chewable tablet Chew 81 mg by mouth daily.  . Cholecalciferol (VITAMIN D3) 2000 UNITS TABS Take 1 tablet by mouth daily.  . cloNIDine (CATAPRES) 0.1 MG tablet Take 0.1 mg by mouth at bedtime.  . fish oil-omega-3 fatty acids 1000 MG capsule Take 2 g by mouth daily.  Marland Kitchen gabapentin (NEURONTIN) 300 MG capsule Take 300 mg by mouth at bedtime.  . IRON PO Take 65 mg by mouth daily.  Marland Kitchen  levothyroxine (SYNTHROID, LEVOTHROID) 100 MCG tablet Take 100 mcg by mouth daily.  . metoprolol succinate (TOPROL-XL) 100 MG 24 hr tablet Take 100 mg by mouth daily. Take with or immediately following a meal.  . omeprazole (PRILOSEC) 20 MG capsule Take 20 mg by mouth 2 (two) times daily.  . rosuvastatin (CRESTOR) 10 MG tablet Take 10 mg by mouth daily.   . sodium chloride (OCEAN) 0.65 % SOLN nasal spray Place 1 spray into both nostrils as needed for congestion.  . sucralfate (CARAFATE) 1 G tablet Take 1 g by mouth 4 (four) times daily.  Marland Kitchen torsemide (DEMADEX) 20 MG tablet Take 20 mg by mouth daily.     ROS:  Review of Systems  HENT: Negative.   Eyes: Negative.   Respiratory: Negative.   Cardiovascular: Negative.   Gastrointestinal:  Negative.   Genitourinary: Negative.   Musculoskeletal: Negative.   Skin: Negative.   Neurological: Negative.   Endo/Heme/Allergies: Negative.   Psychiatric/Behavioral: Negative.   All other systems reviewed and are negative.    Objective:   Today's Vitals: BP 130/60   Pulse 70   Temp 97.9 F (36.6 C) (Temporal)   Resp 15   Ht 5\' 4"  (1.626 m)   Wt 145 lb (65.8 kg)   SpO2 93%   BMI 24.89 kg/m  Vitals with BMI 12/20/2019 12/06/2019 01/13/2019  Height 5\' 4"  5\' 4"  -  Weight 145 lbs 147 lbs -  BMI [id number] 0000000 -  Systolic [id number]  Diastolic 60 78 61  Pulse 70 67 63     Physical Exam Vitals and nursing note reviewed.  Constitutional:      Appearance: Normal appearance. She is well-developed, well-groomed and normal weight.  HENT:     Head: Normocephalic and atraumatic.     Right Ear: External ear normal.     Left Ear: External ear normal.     Mouth/Throat:     Comments: Mask in place Eyes:     General:        Right eye: No discharge.        Left eye: No discharge.     Conjunctiva/sclera: Conjunctivae normal.  Cardiovascular:     Rate and Rhythm: Normal rate and regular rhythm.     Pulses: Normal pulses.     Heart sounds: Normal heart sounds.  Pulmonary:     Effort: Pulmonary effort is normal.     Breath sounds: Normal breath sounds.  Musculoskeletal:        General: Normal range of motion.     Cervical back: Normal range of motion and neck supple.  Skin:    General: Skin is warm.  Neurological:     General: No focal deficit present.     Mental Status: She is alert and oriented to person, place, and time.  Psychiatric:        Attention and Perception: Attention normal.        Mood and Affect: Mood normal.        Speech: Speech normal.        Behavior: Behavior normal. Behavior is cooperative.        Thought Content: Thought content normal.        Cognition and Memory: Cognition normal.        Judgment: Judgment normal.    MMSE - Mini Mental State Exam  12/20/2019  Orientation to time 5  Orientation to Place 5  Registration 3  Attention/ Calculation 5  Recall 3  Language- name  2 objects 2  Language- repeat 1  Language- follow 3 step command 3  Language- read & follow direction 1  Write a sentence 1  Copy design 1  Total score 30     Assessment   1. Forgetfulness   2. Hypertension, unspecified type   3. Arthritis     Tests ordered No orders of the defined types were placed in this encounter.  Plan: Please see assessment and plan per problem list above.   No orders of the defined types were placed in this encounter.   Patient to follow-up in 03/19/2020 .  Perlie Mayo, NP

## 2019-12-20 NOTE — Patient Instructions (Addendum)
Happy New Year! May you have a year filled with hope, love, happiness and laughter.  I appreciate the opportunity to provide you with care for your health and wellness. Today we discussed: Memory  Follow up: May annual visit no pap (morning fasting)  No labs or referrals today  Please continue to practice social distancing to keep you, your family, and our community safe.  If you must go out, please wear a mask and practice good handwashing.  It was a pleasure to see you and I look forward to continuing to work together on your health and well-being. Please do not hesitate to call the office if you need care or have questions about your care.  Have a wonderful day and week. With Gratitude, Cherly Beach, DNP, AGNP-BC

## 2019-12-23 NOTE — Assessment & Plan Note (Signed)
Encouraged heating pad, rubs or creams for muscles.  And Tylenol as needed

## 2019-12-23 NOTE — Assessment & Plan Note (Signed)
Controlled, do not want it to go above where it is at at this time.  We will follow-up with this in May at her annual visit.  She is encouraged to make sure she maintains a DASH diet and ambulates 30 minutes of consistent movement 5 days of the week.

## 2019-12-23 NOTE — Assessment & Plan Note (Signed)
MMSE 30.  No need for medication at this time.  Advised for her to write notes for herself that she needs.  And explained that inattention sometimes happens when were not fully focused on certain things.  She denies getting lost in parking lots or stores or when going around to different areas.  She does not report losing things she is just been forgetful.  Advised that if she starts having trouble that extends past where we are right now for her to let us know.

## 2020-01-24 ENCOUNTER — Telehealth: Payer: Self-pay

## 2020-01-24 ENCOUNTER — Other Ambulatory Visit: Payer: Self-pay

## 2020-01-24 MED ORDER — GABAPENTIN 300 MG PO CAPS: 300.0000 mg | ORAL_CAPSULE | Freq: Every day | ORAL | 5 refills | Status: DC

## 2020-01-24 NOTE — Telephone Encounter (Signed)
Ok to refill? Never sent by you before

## 2020-01-24 NOTE — Telephone Encounter (Signed)
Please send a refill for Gabapentin to [facility], on [address]

## 2020-02-12 ENCOUNTER — Other Ambulatory Visit: Payer: Self-pay | Admitting: Family Medicine

## 2020-03-19 ENCOUNTER — Encounter: Payer: Self-pay | Admitting: Family Medicine

## 2020-03-19 ENCOUNTER — Other Ambulatory Visit: Payer: Self-pay

## 2020-03-19 ENCOUNTER — Ambulatory Visit (INDEPENDENT_AMBULATORY_CARE_PROVIDER_SITE_OTHER): Payer: Medicare Other | Admitting: Family Medicine

## 2020-03-19 VITALS — BP 130/74 | HR 67 | Temp 97.4°F | Resp 15 | Ht 64.0 in | Wt 140.4 lb

## 2020-03-19 DIAGNOSIS — E785 Hyperlipidemia, unspecified: Secondary | ICD-10-CM | POA: Diagnosis not present

## 2020-03-19 DIAGNOSIS — Z13220 Encounter for screening for lipoid disorders: Secondary | ICD-10-CM

## 2020-03-19 DIAGNOSIS — I10 Essential (primary) hypertension: Secondary | ICD-10-CM

## 2020-03-19 DIAGNOSIS — E063 Autoimmune thyroiditis: Secondary | ICD-10-CM | POA: Diagnosis not present

## 2020-03-19 DIAGNOSIS — M797 Fibromyalgia: Secondary | ICD-10-CM

## 2020-03-19 DIAGNOSIS — K219 Gastro-esophageal reflux disease without esophagitis: Secondary | ICD-10-CM

## 2020-03-19 DIAGNOSIS — Z0001 Encounter for general adult medical examination with abnormal findings: Secondary | ICD-10-CM | POA: Insufficient documentation

## 2020-03-19 DIAGNOSIS — R7301 Impaired fasting glucose: Secondary | ICD-10-CM | POA: Diagnosis not present

## 2020-03-19 MED ORDER — METOPROLOL SUCCINATE ER 100 MG PO TB24: 100.0000 mg | ORAL_TABLET | Freq: Every day | ORAL | 1 refills | Status: DC

## 2020-03-19 MED ORDER — AMLODIPINE BESYLATE 10 MG PO TABS: 10.0000 mg | ORAL_TABLET | Freq: Every day | ORAL | 1 refills | Status: DC

## 2020-03-19 MED ORDER — CLONIDINE HCL 0.1 MG PO TABS: 0.1000 mg | ORAL_TABLET | Freq: Every day | ORAL | 1 refills | Status: DC

## 2020-03-19 MED ORDER — OMEPRAZOLE 20 MG PO CPDR: 20.0000 mg | DELAYED_RELEASE_CAPSULE | Freq: Two times a day (BID) | ORAL | 1 refills | Status: DC

## 2020-03-19 MED ORDER — SUCRALFATE 1 G PO TABS: 1.0000 g | ORAL_TABLET | Freq: Four times a day (QID) | ORAL | 1 refills | Status: DC

## 2020-03-19 MED ORDER — ROSUVASTATIN CALCIUM 10 MG PO TABS: 10.0000 mg | ORAL_TABLET | Freq: Every day | ORAL | 1 refills | Status: AC

## 2020-03-19 MED ORDER — TORSEMIDE 20 MG PO TABS: 20.0000 mg | ORAL_TABLET | Freq: Every day | ORAL | 1 refills | Status: DC

## 2020-03-19 NOTE — Assessment & Plan Note (Addendum)
Discussed monthly self breast exams and yearly mammograms; at least 30 minutes of aerobic activity at least 5 days/week and weight-bearing exercise 2x/week; proper sunscreen use reviewed; healthy diet, including goals of calcium and vitamin D intake and alcohol recommendations (less than or equal to 1 drink/day) reviewed; regular seatbelt use; changing batteries in smoke detectors.    Immunization recommendations discussed.  Does not like to take vaccines usually.

## 2020-03-19 NOTE — Patient Instructions (Addendum)
I appreciate the opportunity to provide you with care for your health and wellness. Today we discussed: overall health   Follow up: 4 months  Labs today at [facility] No referrals today  Continue all medications as ordered.  Please continue to practice social distancing to keep you, your family, and our community safe.  If you must go out, please wear a mask and practice good handwashing.  It was a pleasure to see you and I look forward to continuing to work together on your health and well-being. Please do not hesitate to call the office if you need care or have questions about your care.  Have a wonderful day and week. With Gratitude, Cherly Beach, DNP, AGNP-BC  HEALTH MAINTENANCE RECOMMENDATIONS:  It is recommended that you get at least 30 minutes of aerobic exercise at least 5 days/week (for weight loss, you may need as much as 60-90 minutes). This can be any activity that gets your heart rate up. This can be divided in 10-15 minute intervals if needed, but try and build up your endurance at least once a week.  Weight bearing exercise is also recommended twice weekly.  Eat a healthy diet with lots of vegetables, fruits and fiber.  "Colorful" foods have a lot of vitamins (ie green vegetables, tomatoes, red peppers, etc).  Limit sweet tea, regular sodas and alcoholic beverages, all of which has a lot of calories and sugar.  Up to 1 alcoholic drink daily may be beneficial for women (unless trying to lose weight, watch sugars).  Drink a lot of water.  Calcium recommendations are 1200-1500 mg daily (1500 mg for postmenopausal women or women without ovaries), and vitamin D 1000 IU daily.  This should be obtained from diet and/or supplements (vitamins), and calcium should not be taken all at once, but in divided doses.  Monthly self breast exams and yearly mammograms for women over the age of 42 is recommended.  Sunscreen of at least SPF 30 should be used on all sun-exposed parts of the skin  when outside between the hours of 10 am and 4 pm (not just when at beach or pool, but even with exercise, golf, tennis, and yard work!)  Use a sunscreen that says "broad spectrum" so it covers both UVA and UVB rays, and make sure to reapply every 1-2 hours.  Remember to change the batteries in your smoke detectors when changing your clock times in the spring and fall.  Use your seat belt every time you are in a car, and please drive safely and not be distracted with cell phones and texting while driving.

## 2020-03-19 NOTE — Assessment & Plan Note (Signed)
No complaints today, continue Neurontin at this time.  Encouraged mobility and activity.

## 2020-03-19 NOTE — Assessment & Plan Note (Signed)
Brenda Erickson is encouraged to maintain a well balanced diet that is low in salt. Controlled, continue current medication regimen.  Additionally, she is also reminded that exercise is beneficial for heart health and control of  Blood pressure. 30-60 minutes daily is recommended-walking as tolerated with walker or cane And chair activities.

## 2020-03-19 NOTE — Assessment & Plan Note (Signed)
Continue Crestor, ordered updated labs. Encouraged a low fat diet.

## 2020-03-19 NOTE — Assessment & Plan Note (Signed)
Updated labs ordered today. Will address as needed. Denies S&S or hypothyroidism today.

## 2020-03-19 NOTE — Progress Notes (Signed)
Health Maintenance reviewed - .  Immunization History  Administered Date(s) Administered  . Fluad Quad(high Dose 65+) 08/22/2019   Last Pap smear: n/a Last mammogram: 08/2019 Last colonoscopy: n/a Last DEXA: 02/2017 Dentist: dentures-fit well does not go to dentist Ophtho: every 2 years, goes in [employer] glasses Exercise:at home-while holding walker   Other doctors caring for patient include:  Patient Care Team: Perlie Mayo, NP as PCP - General (Family Medicine) Gala Romney, Cristopher Estimable, MD as Attending Physician (Gastroenterology)  End of Life Discussion:  Patient has a living will and medical power of attorney    Code Status: Prior   Subjective:   HPI  Brenda Erickson is a 86 y.o. female who presents for annual wellness visit and follow-up on chronic medical conditions.  She has the following concerns: Denies skin issues. Reports poor appetite at times, not often though. Reports drinking water well. Denies accidents or incontinence of changes in bowel or bladder habits. Denies changes in memory. Denies falls uses cane or walker when ambulating. Reports not sleeping well-sleeps fragmented. This is not new.  Review Of Systems  Review of Systems  Constitutional: Negative.   HENT: Negative.   Eyes: Negative.   Respiratory: Negative.   Cardiovascular: Negative.   Gastrointestinal: Negative.   Endocrine: Negative.   Genitourinary: Negative.   Musculoskeletal: Negative.   Skin: Negative.   Allergic/Immunologic: Negative.   Neurological: Negative.   Hematological: Negative.   Psychiatric/Behavioral: Negative.   All other systems reviewed and are negative.   Objective:   PHYSICAL EXAM:  BP 130/74   Pulse 67   Temp (!) 97.4 F (36.3 C) (Temporal)   Resp 15   Ht 5\' 4"  (1.626 m)   Wt 140 lb 6.4 oz (63.7 kg)   SpO2 97%   BMI 24.10 kg/m   Physical Exam Vitals and nursing note reviewed.  Constitutional:      Appearance: Normal appearance. She is  normal weight.  HENT:     Head: Normocephalic.     Right Ear: Tympanic membrane normal.     Left Ear: Tympanic membrane normal.     Nose: Nose normal.     Mouth/Throat:     Mouth: Mucous membranes are moist.     Pharynx: Oropharynx is clear.     Comments: dentures Eyes:     Extraocular Movements: Extraocular movements intact.     Conjunctiva/sclera: Conjunctivae normal.     Pupils: Pupils are equal, round, and reactive to light.     Comments: glasses  Cardiovascular:     Rate and Rhythm: Normal rate and regular rhythm.     Pulses: Normal pulses.          Radial pulses are 2+ on the right side and 2+ on the left side.       Dorsalis pedis pulses are 2+ on the right side and 2+ on the left side.     Heart sounds: Normal heart sounds.  Pulmonary:     Effort: Pulmonary effort is normal.     Breath sounds: Normal breath sounds.  Abdominal:     General: Abdomen is flat. Bowel sounds are normal.     Palpations: Abdomen is soft.  Musculoskeletal:        General: Normal range of motion.     Cervical back: Normal range of motion and neck supple.     Right lower leg: No edema.     Left lower leg: No edema.     Comments: Kasandra Knudsen  Skin:    General: Skin is warm and dry.     Capillary Refill: Capillary refill takes less than 2 seconds.  Neurological:     General: No focal deficit present.     Mental Status: She is alert and oriented to person, place, and time. Mental status is at baseline.     Cranial Nerves: Cranial nerves are intact.     Sensory: Sensation is intact.     Motor: Motor function is intact.     Gait: Gait is intact.     Deep Tendon Reflexes: Reflexes are normal and symmetric.  Psychiatric:        Attention and Perception: Attention normal.        Mood and Affect: Mood normal.        Behavior: Behavior normal.        Thought Content: Thought content normal.        Judgment: Judgment normal.     Depression Screening  Depression screen [facility] 2/9 12/20/2019 12/06/2019  02/14/2015  Decreased Interest 0 0 0  Down, Depressed, Hopeless 0 0 0  PHQ - 2 Score 0 0 0      Assessment & Plan:   1. Annual visit for general adult medical examination with abnormal findings   2. Essential hypertension   3. Hyperlipidemia LDL goal <70   4. Acquired autoimmune hypothyroidism   5. Fibromyalgia   6. Lipid screening   7. Gastroesophageal reflux disease, unspecified whether esophagitis present     Tests ordered Orders Placed This Encounter  Procedures  . CBC  . COMPLETE METABOLIC PANEL WITH GFR  . Hemoglobin A1c  . Lipid panel  . TSH     Plan: Please see assessment and plan per problem list above.   Meds ordered this encounter  Medications  . amLODipine (NORVASC) 10 MG tablet    Sig: Take 1 tablet (10 mg total) by mouth daily.    Dispense:  90 tablet    Refill:  1  . cloNIDine (CATAPRES) 0.1 MG tablet    Sig: Take 1 tablet (0.1 mg total) by mouth at bedtime.    Dispense:  90 tablet    Refill:  1  . metoprolol succinate (TOPROL-XL) 100 MG 24 hr tablet    Sig: Take 1 tablet (100 mg total) by mouth daily. Take with or immediately following a meal.    Dispense:  90 tablet    Refill:  1  . omeprazole (PRILOSEC) 20 MG capsule    Sig: Take 1 capsule (20 mg total) by mouth 2 (two) times daily.    Dispense:  180 capsule    Refill:  1  . rosuvastatin (CRESTOR) 10 MG tablet    Sig: Take 1 tablet (10 mg total) by mouth daily.    Dispense:  90 tablet    Refill:  1  . sucralfate (CARAFATE) 1 g tablet    Sig: Take 1 tablet (1 g total) by mouth 4 (four) times daily.    Dispense:  360 tablet    Refill:  1  . torsemide (DEMADEX) 20 MG tablet    Sig: Take 1 tablet (20 mg total) by mouth daily. Take 20 mg by mouth daily.    Dispense:  90 tablet    Refill:  1    I have personally reviewed: The patient's medical and social history Their use of alcohol, tobacco or illicit drugs Their current medications and supplements The patient's functional ability  including ADLs,fall risks, home safety risks,  cognitive, and hearing and visual impairment Diet and physical activities Evidence for depression or mood disorders  The patient's weight, height, BMI, and visual acuity have been recorded in the chart.  I have made referrals, counseling, and provided education to the patient based on review of the above and I have provided the patient with a written personalized care plan for preventive services.     Perlie Mayo, NP   03/19/2020

## 2020-03-19 NOTE — Assessment & Plan Note (Signed)
Does well on as long as she has her medications. Needs refills today.

## 2020-03-20 LAB — CBC
HCT: 42.3 % (ref 35.0–45.0)
Hemoglobin: 14.1 g/dL (ref 11.7–15.5)
MCH: 30.6 pg (ref 27.0–33.0)
MCHC: 33.3 g/dL (ref 32.0–36.0)
MCV: 91.8 fL (ref 80.0–100.0)
MPV: 10.5 fL (ref 7.5–12.5)
Platelets: 321 10*3/uL (ref 140–400)
RBC: 4.61 10*6/uL (ref 3.80–5.10)
RDW: 12.2 % (ref 11.0–15.0)
WBC: 6.7 10*3/uL (ref 3.8–10.8)

## 2020-03-20 LAB — COMPLETE METABOLIC PANEL WITH GFR
AG Ratio: 1.4 (calc) (ref 1.0–2.5)
ALT: 32 U/L — ABNORMAL HIGH (ref 6–29)
AST: 28 U/L (ref 10–35)
Albumin: 4 g/dL (ref 3.6–5.1)
Alkaline phosphatase (APISO): 77 U/L (ref 37–153)
BUN/Creatinine Ratio: 14 (calc) (ref 6–22)
BUN: 14 mg/dL (ref 7–25)
CO2: 34 mmol/L — ABNORMAL HIGH (ref 20–32)
Calcium: 10.3 mg/dL (ref 8.6–10.4)
Chloride: 101 mmol/L (ref 98–110)
Creat: 0.98 mg/dL — ABNORMAL HIGH (ref 0.60–0.88)
GFR, Est African American: 61 mL/min/{1.73_m2} (ref >=60)
GFR, Est Non African American: 53 mL/min/{1.73_m2} — ABNORMAL LOW (ref >=60)
Globulin: 2.9 g/dL (calc) (ref 1.9–3.7)
Glucose, Bld: 120 mg/dL (ref 65–139)
Potassium: 4.5 mmol/L (ref 3.5–5.3)
Sodium: 142 mmol/L (ref 135–146)
Total Bilirubin: 0.6 mg/dL (ref 0.2–1.2)
Total Protein: 6.9 g/dL (ref 6.1–8.1)

## 2020-03-20 LAB — LIPID PANEL
Cholesterol: 158 mg/dL (ref <200)
HDL: 56 mg/dL (ref >=50)
LDL Cholesterol (Calc): 78 mg/dL (calc)
Non-HDL Cholesterol (Calc): 102 mg/dL (calc) (ref <130)
Total CHOL/HDL Ratio: 2.8 (calc) (ref <5.0)
Triglycerides: 137 mg/dL (ref <150)

## 2020-03-20 LAB — HEMOGLOBIN A1C
Hgb A1c MFr Bld: 6.3 % of total Hgb — ABNORMAL HIGH (ref <5.7)
Mean Plasma Glucose: 134 (calc)
eAG (mmol/L): 7.4 (calc)

## 2020-03-20 LAB — TSH: TSH: 1.11 mIU/L (ref 0.40–4.50)

## 2020-03-21 NOTE — Progress Notes (Signed)
Pt advised of recommendations with verbal understanding  

## 2020-06-08 ENCOUNTER — Encounter (HOSPITAL_COMMUNITY): Payer: Self-pay | Admitting: Emergency Medicine

## 2020-06-08 ENCOUNTER — Observation Stay (HOSPITAL_COMMUNITY)
Admission: EM | Admit: 2020-06-08 | Discharge: 2020-06-09 | Disposition: A | Discharge status: Home / Self Care | Payer: Medicare Other | Attending: Internal Medicine | Admitting: Internal Medicine

## 2020-06-08 ENCOUNTER — Other Ambulatory Visit: Payer: Self-pay

## 2020-06-08 ENCOUNTER — Emergency Department (HOSPITAL_COMMUNITY): Payer: Medicare Other

## 2020-06-08 DIAGNOSIS — E063 Autoimmune thyroiditis: Secondary | ICD-10-CM | POA: Diagnosis not present

## 2020-06-08 DIAGNOSIS — Z87891 Personal history of nicotine dependence: Secondary | ICD-10-CM | POA: Insufficient documentation

## 2020-06-08 DIAGNOSIS — M1380 Other specified arthritis, unspecified site: Secondary | ICD-10-CM | POA: Diagnosis not present

## 2020-06-08 DIAGNOSIS — Z79899 Other long term (current) drug therapy: Secondary | ICD-10-CM | POA: Diagnosis not present

## 2020-06-08 DIAGNOSIS — U071 COVID-19: Secondary | ICD-10-CM | POA: Diagnosis not present

## 2020-06-08 DIAGNOSIS — J1282 Pneumonia due to coronavirus disease 2019: Secondary | ICD-10-CM | POA: Diagnosis not present

## 2020-06-08 DIAGNOSIS — M1990 Unspecified osteoarthritis, unspecified site: Secondary | ICD-10-CM | POA: Diagnosis present

## 2020-06-08 DIAGNOSIS — I10 Essential (primary) hypertension: Secondary | ICD-10-CM | POA: Diagnosis not present

## 2020-06-08 DIAGNOSIS — K219 Gastro-esophageal reflux disease without esophagitis: Secondary | ICD-10-CM | POA: Diagnosis not present

## 2020-06-08 DIAGNOSIS — R630 Anorexia: Secondary | ICD-10-CM | POA: Diagnosis present

## 2020-06-08 DIAGNOSIS — J90 Pleural effusion, not elsewhere classified: Secondary | ICD-10-CM | POA: Diagnosis not present

## 2020-06-08 DIAGNOSIS — J9601 Acute respiratory failure with hypoxia: Secondary | ICD-10-CM | POA: Diagnosis not present

## 2020-06-08 DIAGNOSIS — J1289 Other viral pneumonia: Secondary | ICD-10-CM | POA: Diagnosis not present

## 2020-06-08 LAB — URINALYSIS, ROUTINE W REFLEX MICROSCOPIC
Bilirubin Urine: NEGATIVE
Glucose, UA: NEGATIVE mg/dL
Hgb urine dipstick: NEGATIVE
Ketones, ur: NEGATIVE mg/dL
Leukocytes,Ua: NEGATIVE
Nitrite: NEGATIVE
Protein, ur: NEGATIVE mg/dL
Specific Gravity, Urine: 1.004 — ABNORMAL LOW (ref 1.005–1.030)
pH: 5 (ref 5.0–8.0)

## 2020-06-08 LAB — BASIC METABOLIC PANEL
Anion gap: 14 (ref 5–15)
BUN: 18 mg/dL (ref 8–23)
CO2: 34 mmol/L — ABNORMAL HIGH (ref 22–32)
Calcium: 9.3 mg/dL (ref 8.9–10.3)
Chloride: 84 mmol/L — ABNORMAL LOW (ref 98–111)
Creatinine, Ser: 1.02 mg/dL — ABNORMAL HIGH (ref 0.44–1.00)
GFR calc Af Amer: 58 mL/min — ABNORMAL LOW (ref >60)
GFR calc non Af Amer: 50 mL/min — ABNORMAL LOW (ref >60)
Glucose, Bld: 130 mg/dL — ABNORMAL HIGH (ref 70–99)
Potassium: 3.6 mmol/L (ref 3.5–5.1)
Sodium: 132 mmol/L — ABNORMAL LOW (ref 135–145)

## 2020-06-08 LAB — SARS CORONAVIRUS 2 BY RT PCR (HOSPITAL ORDER, PERFORMED IN ~~LOC~~ HOSPITAL LAB): SARS Coronavirus 2: POSITIVE — AB

## 2020-06-08 LAB — CBC
HCT: 42 % (ref 36.0–46.0)
Hemoglobin: 13.7 g/dL (ref 12.0–15.0)
MCH: 29.7 pg (ref 26.0–34.0)
MCHC: 32.6 g/dL (ref 30.0–36.0)
MCV: 91.1 fL (ref 80.0–100.0)
Platelets: 243 10*3/uL (ref 150–400)
RBC: 4.61 MIL/uL (ref 3.87–5.11)
RDW: 12.7 % (ref 11.5–15.5)
WBC: 5.5 10*3/uL (ref 4.0–10.5)
nRBC: 0 % (ref 0.0–0.2)

## 2020-06-08 LAB — PROCALCITONIN: Procalcitonin: <0.1 ng/mL

## 2020-06-08 LAB — FIBRINOGEN: Fibrinogen: 494 mg/dL — ABNORMAL HIGH (ref 210–475)

## 2020-06-08 LAB — HEPATIC FUNCTION PANEL
ALT: 35 U/L (ref 0–44)
AST: 45 U/L — ABNORMAL HIGH (ref 15–41)
Albumin: 3.6 g/dL (ref 3.5–5.0)
Alkaline Phosphatase: 56 U/L (ref 38–126)
Bilirubin, Direct: 0.1 mg/dL (ref 0.0–0.2)
Indirect Bilirubin: 0.4 mg/dL (ref 0.3–0.9)
Total Bilirubin: 0.5 mg/dL (ref 0.3–1.2)
Total Protein: 7 g/dL (ref 6.5–8.1)

## 2020-06-08 LAB — D-DIMER, QUANTITATIVE: D-Dimer, Quant: 2.08 ug/mL-FEU — ABNORMAL HIGH (ref 0.00–0.50)

## 2020-06-08 LAB — TRIGLYCERIDES: Triglycerides: 100 mg/dL (ref <150)

## 2020-06-08 LAB — C-REACTIVE PROTEIN: CRP: 7.1 mg/dL — ABNORMAL HIGH (ref <1.0)

## 2020-06-08 LAB — LACTATE DEHYDROGENASE: LDH: 238 U/L — ABNORMAL HIGH (ref 98–192)

## 2020-06-08 LAB — LACTIC ACID, PLASMA: Lactic Acid, Venous: 1.5 mmol/L (ref 0.5–1.9)

## 2020-06-08 LAB — FERRITIN: Ferritin: 1414 ng/mL — ABNORMAL HIGH (ref 11–307)

## 2020-06-08 MED ORDER — SODIUM CHLORIDE 0.9 % IV SOLN: 200.0000 mg | Freq: Once | INTRAVENOUS | Status: DC

## 2020-06-08 MED ORDER — POLYETHYLENE GLYCOL 3350 17 G PO PACK: 17.0000 g | PACK | Freq: Every day | ORAL | Status: DC | PRN

## 2020-06-08 MED ORDER — ROSUVASTATIN CALCIUM 10 MG PO TABS
10.0000 mg | ORAL_TABLET | Freq: Every day | ORAL | Status: DC
  Filled 2020-06-08: qty 1

## 2020-06-08 MED ORDER — SALINE SPRAY 0.65 % NA SOLN: 1.0000 | NASAL | Status: DC | PRN

## 2020-06-08 MED ORDER — GABAPENTIN 300 MG PO CAPS
300.0000 mg | ORAL_CAPSULE | Freq: Every day | ORAL | Status: DC
  Administered 2020-06-09: 300 mg via ORAL
  Filled 2020-06-08: qty 1

## 2020-06-08 MED ORDER — DEXAMETHASONE 4 MG PO TABS
6.0000 mg | ORAL_TABLET | ORAL | Status: DC
  Administered 2020-06-09: 6 mg via ORAL
  Filled 2020-06-08: qty 2

## 2020-06-08 MED ORDER — DEXAMETHASONE SODIUM PHOSPHATE 10 MG/ML IJ SOLN
6.0000 mg | Freq: Once | INTRAMUSCULAR | Status: AC
  Administered 2020-06-08: 6 mg via INTRAVENOUS
  Filled 2020-06-08: qty 1

## 2020-06-08 MED ORDER — POLYVINYL ALCOHOL 1.4 % OP SOLN: 2.0000 [drp] | Freq: Every day | OPHTHALMIC | Status: DC | PRN

## 2020-06-08 MED ORDER — SODIUM CHLORIDE 0.9 % IV SOLN
100.0000 mg | Freq: Every day | INTRAVENOUS | Status: DC
Start: 2020-06-09 — End: 2020-06-08

## 2020-06-08 MED ORDER — ENSURE ENLIVE PO LIQD: 237.0000 mL | Freq: Two times a day (BID) | ORAL | Status: DC

## 2020-06-08 MED ORDER — SODIUM CHLORIDE 0.9 % IV SOLN
100.0000 mg | INTRAVENOUS | Status: AC
  Administered 2020-06-09: 100 mg via INTRAVENOUS

## 2020-06-08 MED ORDER — AMLODIPINE BESYLATE 5 MG PO TABS
10.0000 mg | ORAL_TABLET | Freq: Every day | ORAL | Status: DC
  Administered 2020-06-09: 10 mg via ORAL
  Filled 2020-06-08: qty 2

## 2020-06-08 MED ORDER — ASPIRIN 81 MG PO CHEW
81.0000 mg | CHEWABLE_TABLET | Freq: Every day | ORAL | Status: DC
  Administered 2020-06-09: 81 mg via ORAL
  Filled 2020-06-08: qty 1

## 2020-06-08 MED ORDER — CLONIDINE HCL 0.1 MG PO TABS
0.1000 mg | ORAL_TABLET | Freq: Every day | ORAL | Status: DC
  Administered 2020-06-09: 0.1 mg via ORAL
  Filled 2020-06-08: qty 1

## 2020-06-08 MED ORDER — TORSEMIDE 20 MG PO TABS
20.0000 mg | ORAL_TABLET | Freq: Every day | ORAL | Status: DC
  Administered 2020-06-09: 20 mg via ORAL
  Filled 2020-06-08 (×3): qty 1

## 2020-06-08 MED ORDER — SUCRALFATE 1 G PO TABS
1.0000 g | ORAL_TABLET | Freq: Every day | ORAL | Status: DC
  Administered 2020-06-09: 1 g via ORAL
  Filled 2020-06-08: qty 1

## 2020-06-08 MED ORDER — ENOXAPARIN SODIUM 40 MG/0.4ML ~~LOC~~ SOLN
40.0000 mg | SUBCUTANEOUS | Status: DC
  Administered 2020-06-09: 40 mg via SUBCUTANEOUS
  Filled 2020-06-08: qty 0.4

## 2020-06-08 MED ORDER — LEVOTHYROXINE SODIUM 100 MCG PO TABS
100.0000 ug | ORAL_TABLET | Freq: Every day | ORAL | Status: DC
  Administered 2020-06-09: 100 ug via ORAL
  Filled 2020-06-08: qty 1

## 2020-06-08 MED ORDER — PANTOPRAZOLE SODIUM 40 MG PO TBEC
40.0000 mg | DELAYED_RELEASE_TABLET | Freq: Every day | ORAL | Status: DC
  Administered 2020-06-09: 40 mg via ORAL
  Filled 2020-06-08: qty 1

## 2020-06-08 MED ORDER — METOPROLOL SUCCINATE ER 50 MG PO TB24
100.0000 mg | ORAL_TABLET | Freq: Every day | ORAL | Status: DC
  Administered 2020-06-09: 100 mg via ORAL
  Filled 2020-06-08: qty 2

## 2020-06-08 MED ORDER — SODIUM CHLORIDE 0.9 % IV SOLN
100.0000 mg | Freq: Every day | INTRAVENOUS | Status: DC
  Administered 2020-06-09 (×2): 100 mg via INTRAVENOUS
  Filled 2020-06-08: qty 20

## 2020-06-08 MED ORDER — ACETAMINOPHEN 325 MG PO TABS: 650.0000 mg | ORAL_TABLET | Freq: Four times a day (QID) | ORAL | Status: DC | PRN

## 2020-06-08 NOTE — ED Triage Notes (Signed)
Pt states " my daughter sent me up here to be tested for covid. I haven't eaten properly in 3 months"  Denies any other s/s

## 2020-06-08 NOTE — ED Notes (Signed)
Critical Value  Pt is Covid positive

## 2020-06-08 NOTE — H&P (Signed)
History and Physical:    Brenda Erickson   DEY:[id number] DOB: 02-Jan-1933 DOA: 06/08/2020  Referring MD/provider: PA Vernie Shanks PCP: Perlie Mayo, NP   Patient coming from: Home  Chief Complaint: COVID-19 infection  History of Present Illness:   Brenda Erickson is an 86 y.o. female with PMH significant for HTN, GERD was sent in by her daughter to get tested for COVID-19 as patient's son has been diagnosed is now intubated at the [facility].  Patient herself thinks that she is feeling like she normally feels.  She specifically denies any shortness of breath but does admit to may be feeling a little bit weaker when she is walking around the house.  She does admit to markedly decreased p.o. intake over the past month.  She denies any fevers or chills.  As noted above she is not sure if she short of breath or not.  Patient denies nausea vomiting or diarrhea.  She denies any syncope or presyncope.  She is not sure why she is not hungry because normally she has a good appetite.   ED Course:  The patient was noted to be positive for COVID-19.  Chest x-ray is read as bibasilar atelectasis versus pneumonia secondary to COVID-19.  She is noted to have O2 sats 90 to 97% on room air at rest.  ROS:   ROS   Review of Systems: As per HPI  Past Medical History:   Past Medical History:  Diagnosis Date  . Acquired trigger finger 05/25/2012  . Arthritis   . Cancer ([place])    mass on left kidney  . Change in bowel habits 09/21/2012  . CONSTIPATION 04/22/2009   Qualifier: Diagnosis of  By: Christ Kick    . Diverticulosis   . DIVERTICULOSIS, COLON 04/22/2009   Qualifier: Diagnosis of  By: Christ Kick    . EPIGASTRIC PAIN 04/22/2009   Qualifier: Diagnosis of  By: Christ Kick    . Esophageal reflux   . Fibromyalgia   . GOITER 04/22/2009   Qualifier: History of  By: Christ Kick    . Hashimoto's disease   . Hashimoto's disease   . Helicobacter pylori gastritis    in  remote past, s/p treatment   . HELICOBACTER PYLORI GASTRITIS, HX OF 04/22/2009   Qualifier: Diagnosis of  By: Christ Kick    . HIP PAIN, RIGHT 04/22/2009   Qualifier: Diagnosis of  By: Christ Kick    . History of gastroesophageal reflux (GERD) 05/16/2013  . HTN (hypertension)   . Hyperlipidemia LDL goal <70 01/29/2015  . Hypothyroid   . Hypothyroidism 04/22/2009   Qualifier: Diagnosis of  By: Christ Kick    . Kidney stones   . Low back pain radiating to both legs 05/16/2013  . Myalgia and myositis 04/22/2009   Qualifier: Diagnosis of  By: Christ Kick    . Renal mass, left 05/02/2013  . RLQ abdominal tenderness 05/16/2014  . RLQ abdominal tenderness 05/16/2014  . TIA (transient ischemic attack) 01/27/2015  . TRIGGER FINGER 02/11/2009   Qualifier: Diagnosis of  By: Aline Brochure MD, Dorothyann Peng    . TRIGGER FINGER DEFORMITY 06/25/2010   Qualifier: Diagnosis of  By: Aline Brochure MD, Dorothyann Peng      Past Surgical History:   Past Surgical History:  Procedure Laterality Date  . ABDOMINAL HYSTERECTOMY     tumor removal; benign  . BLADDER SURGERY    . CHOLECYSTECTOMY    . COLONOSCOPY  05/22/2003   DJS:HFWYOV rectum/ Left-sided diverticula.  The remainder  of the colonic mucosa and terminal  ileum appeared normal  . COLONOSCOPY  10/05/2012   NWG:NFAOZHY diverticulosis. Tubular adenomas   . COLONOSCOPY N/A 12/08/2017   Procedure: COLONOSCOPY;  Surgeon: Daneil Dolin, MD;  Location: [facility];  Service: Endoscopy;  Laterality: N/A;  8:30  . ESOPHAGOGASTRODUODENOSCOPY  05/22/2003   RMR: Tiny distal esophageal erosions consistent with mild erosive reflux esophagitisOtherwise, normal upper gastrointestinal tract through the second part of   the duodenum  . ESOPHAGOGASTRODUODENOSCOPY  07/22/1992   RMR: Normal EGD  . ROBOTIC ASSITED PARTIAL NEPHRECTOMY     left. States was cancerous.   Marland Kitchen TOTAL HIP ARTHROPLASTY      Social History:   Social History   Socioeconomic History  . Marital  status: Widowed    Spouse name: Not on file  . Number of children: 7  . Years of education: 73  . Highest education level: Not on file  Occupational History  . Occupation: retired  Tobacco Use  . Smoking status: Former Smoker    Types: Cigarettes  . Smokeless tobacco: Former Systems developer    Types: Snuff  Vaping Use  . Vaping Use: Never used  Substance and Sexual Activity  . Alcohol use: Yes    Comment: wine every now and then  . Drug use: No  . Sexual activity: Not Currently    Birth control/protection: Surgical  Other Topics Concern  . Not on file  Social History Narrative   Lives alone,    all children live close by, but son lives in [place]   Right handed      Enjoy: reading bible, Marilynn Latino witness      Diet: all food groups   Caffeine: tea daily, coffee occassionally    Water: water-tonic water       Does not drive anymore, son brought her in   Does wear seat belt   Smoke and carbon monoxide detectors    Social Determinants of Health   Financial Resource Strain:   . Difficulty of Paying Living Expenses:   Food Insecurity:   . Worried About Charity fundraiser in the Last Year:   . Arboriculturist in the Last Year:   Transportation Needs: No Transportation Needs  . Lack of Transportation (Medical): No  . Lack of Transportation (Non-Medical): No  Physical Activity:   . Days of Exercise per Week:   . Minutes of Exercise per Session:   Stress:   . Feeling of Stress :   Social Connections: Moderately Isolated  . Frequency of Communication with Friends and Family: More than three times a week  . Frequency of Social Gatherings with Friends and Family: More than three times a week  . Attends Religious Services: More than 4 times per year  . Active Member of Clubs or Organizations: No  . Attends Archivist Meetings: Never  . Marital Status: Widowed  Intimate Partner Violence: Not At Risk  . Fear of Current or Ex-Partner: No  . Emotionally Abused: No  . Physically  Abused: No  . Sexually Abused: No    Allergies   Codeine and Other  Family history:   Family History  Problem Relation Age of Onset  . Diabetes Father   . Liver cancer Mother   . Lung cancer Mother   . Diabetes Sister   . Other Son        backpain  . Other Son        staph in jugular vein  .  Stroke Son   . Other Son        moya moya  . Hypertension Other   . Cancer Other        lung  . Cancer Other        cancer  . Colon cancer Neg Hx     Current Medications:   Prior to Admission medications   Medication Sig Start Date End Date Taking? Authorizing Provider  amLODipine (NORVASC) 10 MG tablet Take 1 tablet (10 mg total) by mouth daily. 03/19/20  Yes Perlie Mayo, NP  Artificial Tear Solution (TEARS AGAIN OP) Place 1 drop into both eyes daily as needed (for dry eye relief).    Yes [provider]  aspirin 81 MG chewable tablet Chew 81 mg by mouth in the morning.    Yes [provider]  Cholecalciferol (VITAMIN D3) 2000 UNITS TABS Take 1 tablet by mouth daily.   Yes [provider]  cloNIDine (CATAPRES) 0.1 MG tablet Take 1 tablet (0.1 mg total) by mouth at bedtime. 03/19/20  Yes Perlie Mayo, NP  ferrous sulfate 325 (65 FE) MG EC tablet Take 325 mg by mouth daily.   Yes [provider]  gabapentin (NEURONTIN) 300 MG capsule Take 1 capsule (300 mg total) by mouth at bedtime. 01/24/20  Yes Fayrene Helper, MD  levothyroxine (SYNTHROID, LEVOTHROID) 100 MCG tablet Take 100 mcg by mouth daily before breakfast.    Yes [provider]  metoprolol succinate (TOPROL-XL) 100 MG 24 hr tablet Take 1 tablet (100 mg total) by mouth daily. Take with or immediately following a meal. 03/19/20  Yes Perlie Mayo, NP  rosuvastatin (CRESTOR) 10 MG tablet Take 1 tablet (10 mg total) by mouth daily. 03/19/20  Yes Perlie Mayo, NP  sodium chloride (OCEAN) 0.65 % SOLN nasal spray Place 1 spray into both nostrils as needed for congestion.   Yes  [provider]  sucralfate (CARAFATE) 1 g tablet Take 1 tablet (1 g total) by mouth 4 (four) times daily. Patient taking differently: Take 1 g by mouth daily.  03/19/20  Yes Perlie Mayo, NP  torsemide (DEMADEX) 20 MG tablet Take 1 tablet (20 mg total) by mouth daily. Take 20 mg by mouth daily. Patient taking differently: Take 20 mg by mouth daily.  03/19/20  Yes Perlie Mayo, NP  omeprazole (PRILOSEC) 20 MG capsule Take 1 capsule (20 mg total) by mouth 2 (two) times daily. 03/19/20   Perlie Mayo, NP    Physical Exam:   Vitals:   06/08/20 1158 06/08/20 1434 06/08/20 1624 06/08/20 1729  BP: 117/72 (!) 141/66 (!) 147/67 (!) 148/64  Pulse: 67 62 65 71  Resp: 15 16 18 16   Temp: 99.9 F (37.7 C)   98.5 F (36.9 C)  TempSrc: Oral   Oral  SpO2: 96% 97% 90% 90%  Weight: 65.8 kg     Height: 5\' 4"  (1.626 m)        Physical Exam: Blood pressure (!) 148/64, pulse 71, temperature 98.5 F (36.9 C), temperature source Oral, resp. rate 16, height 5\' 4"  (1.626 m), weight 65.8 kg, SpO2 90 %. Gen: Relatively well-appearing female lying flat in bed speaking in full sentences, NARD on room air. Eyes: sclera anicteric, CVS: S1-S2, regulary, no gallops Respiratory: Few rales at bases bilaterally.  Good air entry.   GI: NABS, soft, NT  LE: No edema. No cyanosis Neuro: A/O x 3, Moving all extremities equally with normal strength,  CN 3-12 intact, grossly nonfocal.  Psych: patient is logical and coherent, judgement and insight appear normal, mood and affect appropriate to situation. Skin: no rashes or lesions or ulcers,    Data Review:    Labs: Basic Metabolic Panel: Recent Labs  Lab 06/08/20 1243  NA 132*  K 3.6  CL 84*  CO2 34*  GLUCOSE 130*  BUN 18  CREATININE 1.02*  CALCIUM 9.3   Liver Function Tests: Recent Labs  Lab 06/08/20 1243  AST 45*  ALT 35  ALKPHOS 56  BILITOT 0.5  PROT 7.0  ALBUMIN 3.6   No results for input(s): LIPASE, AMYLASE in the last 168  hours. No results for input(s): AMMONIA in the last 168 hours. CBC: Recent Labs  Lab 06/08/20 1243  WBC 5.5  HGB 13.7  HCT 42.0  MCV 91.1  PLT 243   Cardiac Enzymes: No results for input(s): CKTOTAL, CKMB, CKMBINDEX, TROPONINI in the last 168 hours.  BNP (last 3 results) No results for input(s): PROBNP in the last 8760 hours. CBG: No results for input(s): GLUCAP in the last 168 hours.  Urinalysis    Component Value Date/Time   COLORURINE STRAW (A) 06/08/2020 1202   APPEARANCEUR CLEAR 06/08/2020 1202   LABSPEC 1.004 (L) 06/08/2020 1202   PHURINE 5.0 06/08/2020 1202   GLUCOSEU NEGATIVE 06/08/2020 1202   HGBUR NEGATIVE 06/08/2020 1202   BILIRUBINUR NEGATIVE 06/08/2020 1202   KETONESUR NEGATIVE 06/08/2020 1202   PROTEINUR NEGATIVE 06/08/2020 1202   UROBILINOGEN 0.2 01/27/2015 2306   NITRITE NEGATIVE 06/08/2020 1202   LEUKOCYTESUR NEGATIVE 06/08/2020 1202      Radiographic Studies: DG Chest Port 1 View  Result Date: 06/08/2020 CLINICAL DATA:  COVID positive today.  Decreased appetite. EXAM: PORTABLE CHEST 1 VIEW COMPARISON:  01/13/2019 FINDINGS: Stable heart size and mediastinal contours. Mild chronic elevation of right hemidiaphragm. Subsegmental opacities in the left lung base. No confluent consolidation. No pulmonary edema, pleural effusion, or pneumothorax. Stable osseous structures. IMPRESSION: Subsegmental opacities in the left lung base, atelectasis versus pneumonia in the setting of COVID-19. Electronically Signed   By: Keith Rake M.D.   On: 06/08/2020 16:19    EKG: Independently reviewed.  Poor baseline.  Sinus rhythm at 66.  Q waves V1 through V3.  No acute ST-T wave changes.   Assessment/Plan:   Principal Problem:   Pneumonia due to COVID-19 virus Active Problems:   HTN (hypertension)   GERD (gastroesophageal reflux disease)   Acquired autoimmune hypothyroidism   Arthritis   86 year old female was sent in by daughter due to family exposure.   Patient herself feels relatively well but chest x-ray does have some bibasilar infiltrates versus atelectasis.  She has had decreased p.o. intake and laboratory data do show new hyponatremia.  COVID-19 infection Chest x-ray is abnormal, O2 sats 90 to 97% on room air Procalcitonin 1.5, LDH 238 ferritin 1414, CRP 7.1 Will admit for observation  Remdesivir and steroids started in ED. Would ambulate patient in the morning and see if she is able to maintain O2 sats. She has not been eating very much for a month per her own report, sodium is somewhat low suggestive of maybe some mild intravascular volume depletion.  Patient to orally hydrate as tolerated.  HTN Continue amlodipine, clonidine, metoprolol XL and torsemide per home doses  Hypothyroidism Continue Synthroid  GERD Continue Prilosec and Carafate  Chronic lower back pain Continue gabapentin    Other information:   DVT prophylaxis: Lovenox ordered. Code Status: Full Family Communication:  Called patient's daughter Tomie China, left a message on her phone Disposition Plan: Home Consults called: None Admission status: Observation  Brenda Erickson Tublu Odean Fester [facility]  If 7PM-7AM, please contact night-coverage [email or web address] Password [facility] 06/08/2020, 8:31 PM

## 2020-06-08 NOTE — ED Notes (Signed)
Call from daughter   Truitt Leep [phone]  Please call if pt is to be released

## 2020-06-08 NOTE — ED Notes (Addendum)
Tomie China, daughter to be called with updates per patient's approval. Phone # [phone]

## 2020-06-08 NOTE — ED Provider Notes (Signed)
[facility] EMERGENCY DEPARTMENT Provider Note   CSN: [id number] Arrival date & time: 06/08/20  1150     History Chief Complaint  Patient presents with  . Failure To Thrive    Brenda Erickson is a 86 y.o. female her reports 3 days of symptoms of severe anorexia, chills, body aches.  She has had a cough.  Patient states that her daughter told her to come here and get checked out.  She has not been vaccinated for the Covid test.  Screening labs showed that the patient is Covid positive.  She denies abdominal pain, nausea, vomiting, urinary symptoms.  HPI     Past Medical History:  Diagnosis Date  . Acquired trigger finger 05/25/2012  . Arthritis   . Cancer ([place])    mass on left kidney  . Change in bowel habits 09/21/2012  . CONSTIPATION 04/22/2009   Qualifier: Diagnosis of  By: Christ Kick    . Diverticulosis   . DIVERTICULOSIS, COLON 04/22/2009   Qualifier: Diagnosis of  By: Christ Kick    . EPIGASTRIC PAIN 04/22/2009   Qualifier: Diagnosis of  By: Christ Kick    . Esophageal reflux   . Fibromyalgia   . GOITER 04/22/2009   Qualifier: History of  By: Christ Kick    . Hashimoto's disease   . Hashimoto's disease   . Helicobacter pylori gastritis    in remote past, s/p treatment   . HELICOBACTER PYLORI GASTRITIS, HX OF 04/22/2009   Qualifier: Diagnosis of  By: Christ Kick    . HIP PAIN, RIGHT 04/22/2009   Qualifier: Diagnosis of  By: Christ Kick    . History of gastroesophageal reflux (GERD) 05/16/2013  . HTN (hypertension)   . Hyperlipidemia LDL goal <70 01/29/2015  . Hypothyroid   . Hypothyroidism 04/22/2009   Qualifier: Diagnosis of  By: Christ Kick    . Kidney stones   . Low back pain radiating to both legs 05/16/2013  . Myalgia and myositis 04/22/2009   Qualifier: Diagnosis of  By: Christ Kick    . Renal mass, left 05/02/2013  . RLQ abdominal tenderness 05/16/2014  . RLQ abdominal tenderness 05/16/2014  . TIA (transient ischemic  attack) 01/27/2015  . TRIGGER FINGER 02/11/2009   Qualifier: Diagnosis of  By: Aline Brochure MD, Dorothyann Peng    . TRIGGER FINGER DEFORMITY 06/25/2010   Qualifier: Diagnosis of  By: Aline Brochure MD, Dorothyann Peng      Patient Active Problem List   Diagnosis Date Noted  . Annual visit for general adult medical examination with abnormal findings 03/19/2020  . Arthritis 12/20/2019  . Forgetfulness 12/06/2019  . Hyperlipidemia LDL goal <70 01/29/2015  . Acquired autoimmune hypothyroidism 05/16/2013  . HTN (hypertension) 04/22/2009  . GERD (gastroesophageal reflux disease) 04/22/2009  . Fibromyalgia 02/11/2009    Past Surgical History:  Procedure Laterality Date  . ABDOMINAL HYSTERECTOMY     tumor removal; benign  . BLADDER SURGERY    . CHOLECYSTECTOMY    . COLONOSCOPY  05/22/2003   UXN:ATFTDD rectum/ Left-sided diverticula.  The remainder of the colonic mucosa and terminal  ileum appeared normal  . COLONOSCOPY  10/05/2012   UKG:URKYHCW diverticulosis. Tubular adenomas   . COLONOSCOPY N/A 12/08/2017   Procedure: COLONOSCOPY;  Surgeon: Daneil Dolin, MD;  Location: [facility];  Service: Endoscopy;  Laterality: N/A;  8:30  . ESOPHAGOGASTRODUODENOSCOPY  05/22/2003   RMR: Tiny distal esophageal erosions consistent with mild erosive reflux esophagitisOtherwise, normal upper gastrointestinal tract through the second part of   the  duodenum  . ESOPHAGOGASTRODUODENOSCOPY  07/22/1992   RMR: Normal EGD  . ROBOTIC ASSITED PARTIAL NEPHRECTOMY     left. States was cancerous.   Marland Kitchen TOTAL HIP ARTHROPLASTY       OB History    Gravida  11   Para  7   Term      Preterm      AB  4   Living  7     SAB  4   TAB      Ectopic      Multiple      Live Births              Family History  Problem Relation Age of Onset  . Diabetes Father   . Liver cancer Mother   . Lung cancer Mother   . Diabetes Sister   . Other Son        backpain  . Other Son        staph in jugular vein  . Stroke Son   .  Other Son        moya moya  . Hypertension Other   . Cancer Other        lung  . Cancer Other        cancer  . Colon cancer Neg Hx     Social History   Tobacco Use  . Smoking status: Former Smoker    Types: Cigarettes  . Smokeless tobacco: Former Systems developer    Types: Snuff  Vaping Use  . Vaping Use: Never used  Substance Use Topics  . Alcohol use: Yes    Comment: wine every now and then  . Drug use: No    Home Medications Prior to Admission medications   Medication Sig Start Date End Date Taking? Authorizing Provider  amLODipine (NORVASC) 10 MG tablet Take 1 tablet (10 mg total) by mouth daily. 03/19/20   Perlie Mayo, NP  Artificial Tear Solution (TEARS AGAIN OP) Place 1 drop into both eyes daily.    [provider]  aspirin 81 MG chewable tablet Chew 81 mg by mouth daily.    [provider]  Cholecalciferol (VITAMIN D3) 2000 UNITS TABS Take 1 tablet by mouth daily.    [provider]  cloNIDine (CATAPRES) 0.1 MG tablet Take 1 tablet (0.1 mg total) by mouth at bedtime. 03/19/20   Perlie Mayo, NP  fish oil-omega-3 fatty acids 1000 MG capsule Take 2 g by mouth daily.    [provider]  gabapentin (NEURONTIN) 300 MG capsule Take 1 capsule (300 mg total) by mouth at bedtime. 01/24/20   Fayrene Helper, MD  IRON PO Take 65 mg by mouth daily.    [provider]  levothyroxine (SYNTHROID, LEVOTHROID) 100 MCG tablet Take 100 mcg by mouth daily.    [provider]  metoprolol succinate (TOPROL-XL) 100 MG 24 hr tablet Take 1 tablet (100 mg total) by mouth daily. Take with or immediately following a meal. 03/19/20   Perlie Mayo, NP  omeprazole (PRILOSEC) 20 MG capsule Take 1 capsule (20 mg total) by mouth 2 (two) times daily. 03/19/20   Perlie Mayo, NP  rosuvastatin (CRESTOR) 10 MG tablet Take 1 tablet (10 mg total) by mouth daily. 03/19/20   Perlie Mayo, NP  sodium chloride (OCEAN) 0.65 % SOLN nasal spray Place 1 spray  into both nostrils as needed for congestion.    [provider]  sucralfate (CARAFATE) 1 g tablet Take  1 tablet (1 g total) by mouth 4 (four) times daily. 03/19/20   Perlie Mayo, NP  torsemide (DEMADEX) 20 MG tablet Take 1 tablet (20 mg total) by mouth daily. Take 20 mg by mouth daily. 03/19/20   Perlie Mayo, NP    Allergies    Codeine and Other  Review of Systems   Review of Systems  Ten systems reviewed and are negative for acute change, except as noted in the HPI.  Physical Exam Updated Vital Signs BP (!) 141/66 (BP Location: Right Arm)   Pulse 62   Temp 99.9 F (37.7 C) (Oral)   Resp 16   Ht 5\' 4"  (1.626 m)   Wt 65.8 kg   SpO2 97%   BMI 24.89 kg/m   Physical Exam Vitals and nursing note reviewed.  Constitutional:      General: She is not in acute distress.    Appearance: She is well-developed. She is not diaphoretic.  HENT:     Head: Normocephalic and atraumatic.  Eyes:     General: No scleral icterus.    Conjunctiva/sclera: Conjunctivae normal.  Cardiovascular:     Rate and Rhythm: Normal rate and regular rhythm.     Heart sounds: Normal heart sounds. No murmur heard.  No friction rub. No gallop.   Pulmonary:     Effort: Pulmonary effort is normal. No respiratory distress.     Breath sounds: Normal breath sounds.  Abdominal:     General: Bowel sounds are normal. There is no distension.     Palpations: Abdomen is soft. There is no mass.     Tenderness: There is no abdominal tenderness. There is no guarding.  Musculoskeletal:     Cervical back: Normal range of motion.  Skin:    General: Skin is warm and dry.  Neurological:     Mental Status: She is alert and oriented to person, place, and time.  Psychiatric:        Behavior: Behavior normal.     ED Results / Procedures / Treatments   Labs (all labs ordered are listed, but only abnormal results are displayed) Labs Reviewed  SARS CORONAVIRUS 2 BY RT PCR ([facility]) - Abnormal; Notable for the following components:      Result Value   SARS Coronavirus 2 POSITIVE (*)    All other components within normal limits  BASIC METABOLIC PANEL - Abnormal; Notable for the following components:   Sodium 132 (*)    Chloride 84 (*)    CO2 34 (*)    Glucose, Bld 130 (*)    Creatinine, Ser 1.02 (*)    GFR calc non Af Amer 50 (*)    GFR calc Af Amer 58 (*)    All other components within normal limits  URINALYSIS, ROUTINE W REFLEX MICROSCOPIC - Abnormal; Notable for the following components:   Color, Urine STRAW (*)    Specific Gravity, Urine 1.004 (*)    All other components within normal limits  CBC    EKG None  Radiology No results found.  Procedures Procedures (including critical care time)  Medications Ordered in ED Medications - No data to display  ED Course  I have reviewed the triage vital signs and the nursing notes.  Pertinent labs & imaging results that were available during my care of the patient were reviewed by me and considered in my medical decision making (see chart for details).    MDM Rules/Calculators/A&P  86 year old female here with poor appetite, chills.  On my examination the patient's oxygen saturation was between 88 and 91 percent on room air with good waveform.  I placed her on 2 L of oxygen via nasal cannula and her oxygen saturations are currently 99 percent.  I reviewed the labs which show sodium of 132, slightly elevated blood glucose, creatinine is at baseline.  CBC shows no elevated white blood cell count.  Patient with elevated inflammatory markers, Covid positive.  Her oxygen saturations have been persistently low on room air.  She will need admission to the hospitalist service.   Alean Kromer was evaluated in Emergency Department on 06/08/2020 for the symptoms described in the history of present illness. She was evaluated in the context of the global COVID-19  pandemic, which necessitated consideration that the patient might be at risk for infection with the SARS-CoV-2 virus that causes COVID-19. Institutional protocols and algorithms that pertain to the evaluation of patients at risk for COVID-19 are in a state of rapid change based on information released by regulatory bodies including the CDC and federal and state organizations. These policies and algorithms were followed during the patient's care in the ED.  Final Clinical Impression(s) / ED Diagnoses Final diagnoses:  None    Rx / DC Orders ED Discharge Orders    None       Margarita Mail, PA-C 06/08/20 2202    Sherwood Gambler, MD 06/11/20 [phone]

## 2020-06-08 NOTE — ED Notes (Addendum)
Pt. States " My son is in the hospital with Covid. He came to visit me last week. He only saw me one day". Pt. Also complains of back aches when they move.

## 2020-06-09 DIAGNOSIS — J1282 Pneumonia due to coronavirus disease 2019: Secondary | ICD-10-CM

## 2020-06-09 DIAGNOSIS — U071 COVID-19: Principal | ICD-10-CM

## 2020-06-09 LAB — COMPREHENSIVE METABOLIC PANEL
ALT: 30 U/L (ref 0–44)
AST: 32 U/L (ref 15–41)
Albumin: 3.1 g/dL — ABNORMAL LOW (ref 3.5–5.0)
Alkaline Phosphatase: 51 U/L (ref 38–126)
Anion gap: 14 (ref 5–15)
BUN: 18 mg/dL (ref 8–23)
CO2: 34 mmol/L — ABNORMAL HIGH (ref 22–32)
Calcium: 8.9 mg/dL (ref 8.9–10.3)
Chloride: 88 mmol/L — ABNORMAL LOW (ref 98–111)
Creatinine, Ser: 0.93 mg/dL (ref 0.44–1.00)
GFR calc Af Amer: >60 mL/min (ref >60)
GFR calc non Af Amer: 56 mL/min — ABNORMAL LOW (ref >60)
Glucose, Bld: 179 mg/dL — ABNORMAL HIGH (ref 70–99)
Potassium: 3.2 mmol/L — ABNORMAL LOW (ref 3.5–5.1)
Sodium: 136 mmol/L (ref 135–145)
Total Bilirubin: 0.5 mg/dL (ref 0.3–1.2)
Total Protein: 6.5 g/dL (ref 6.5–8.1)

## 2020-06-09 LAB — CBC WITH DIFFERENTIAL/PLATELET
Abs Immature Granulocytes: 0.02 10*3/uL (ref 0.00–0.07)
Basophils Absolute: 0 10*3/uL (ref 0.0–0.1)
Basophils Relative: 0 %
Eosinophils Absolute: 0 10*3/uL (ref 0.0–0.5)
Eosinophils Relative: 0 %
HCT: 41.5 % (ref 36.0–46.0)
Hemoglobin: 13.6 g/dL (ref 12.0–15.0)
Immature Granulocytes: 1 %
Lymphocytes Relative: 24 %
Lymphs Abs: 0.8 10*3/uL (ref 0.7–4.0)
MCH: 29.6 pg (ref 26.0–34.0)
MCHC: 32.8 g/dL (ref 30.0–36.0)
MCV: 90.2 fL (ref 80.0–100.0)
Monocytes Absolute: 0.2 10*3/uL (ref 0.1–1.0)
Monocytes Relative: 7 %
Neutro Abs: 2.3 10*3/uL (ref 1.7–7.7)
Neutrophils Relative %: 68 %
Platelets: 262 10*3/uL (ref 150–400)
RBC: 4.6 MIL/uL (ref 3.87–5.11)
RDW: 12.4 % (ref 11.5–15.5)
WBC: 3.3 10*3/uL — ABNORMAL LOW (ref 4.0–10.5)
nRBC: 0 % (ref 0.0–0.2)

## 2020-06-09 LAB — ABO/RH: ABO/RH(D): O POS

## 2020-06-09 LAB — C-REACTIVE PROTEIN: CRP: 7.7 mg/dL — ABNORMAL HIGH (ref <1.0)

## 2020-06-09 MED ORDER — POTASSIUM CHLORIDE CRYS ER 20 MEQ PO TBCR
40.0000 meq | EXTENDED_RELEASE_TABLET | Freq: Once | ORAL | Status: AC
  Administered 2020-06-09: 40 meq via ORAL
  Filled 2020-06-09: qty 2

## 2020-06-09 MED ORDER — GUAIFENESIN-DM 100-10 MG/5ML PO SYRP
5.0000 mL | ORAL_SOLUTION | ORAL | 0 refills | Status: DC | PRN
Start: 2020-06-09 — End: 2020-07-16

## 2020-06-09 MED ORDER — DEXAMETHASONE 6 MG PO TABS: 6.0000 mg | ORAL_TABLET | ORAL | 0 refills | Status: DC

## 2020-06-09 MED ORDER — ALBUTEROL SULFATE HFA 108 (90 BASE) MCG/ACT IN AERS: 1.0000 | INHALATION_SPRAY | Freq: Four times a day (QID) | RESPIRATORY_TRACT | 0 refills | Status: DC | PRN

## 2020-06-09 NOTE — Discharge Summary (Signed)
Physician Discharge Summary  Brenda Erickson RWE:[id number] DOB: May 28, 1933 DOA: 06/08/2020  PCP: Perlie Mayo, NP  Admit date: 06/08/2020  Discharge date: 06/09/2020  Admitted From:Home  Disposition:  Home  Recommendations for Outpatient Follow-up:  1. Follow up with PCP in 1-2 weeks 2. Please obtain BMP/CBC in one week 3. Continue isolation/quarantine for Covid infection for next 14 days 4. Continue Decadron as prescribed for 8 more days to complete 10-day course of treatment 5. Remdesivir infusion scheduled for 8/2, 8/3, and 8/4 to complete course of treatment. Daughter states that she will take patient to the infusion center 6. Albuterol breathing treatments as well as Robitussin prescribed for as needed cough/shortness of breath/wheezing  Home Health: None  Equipment/Devices: None  Discharge Condition: Stable  CODE STATUS: Full  Diet recommendation: Heart Healthy  Brief/Interim Summary: Per HPI: Brenda Erickson is an 86 y.o. female with PMH significant for HTN, GERD was sent in by her daughter to get tested for COVID-19 as patient's son has been diagnosed is now intubated at the [facility] hospital.  Patient herself thinks that she is feeling like she normally feels.  She specifically denies any shortness of breath but does admit to may be feeling a little bit weaker when she is walking around the house.  She does admit to markedly decreased p.o. intake over the past month.  She denies any fevers or chills.  As noted above she is not sure if she short of breath or not.  Patient denies nausea vomiting or diarrhea.  She denies any syncope or presyncope.  She is not sure why she is not hungry because normally she has a good appetite.  -Patient was admitted with COVID-19 pneumonia with minimal findings on chest x-ray and mild inflammation noted. She was not noted to be hypoxemic, but was started on Decadron and remdesivir. She has completed 2 doses of remdesivir this morning and  continues to remain at her usual baseline with no respiratory distress, cough, shortness of breath, or any other complaints. She stable for discharge with 3 more days of infusion scheduled. She will also remain on her Decadron and remain in isolation as discussed. Daughter also notified and she understands the plan. No other significant medical events or issues noted throughout the course of this brief hospitalization. She is overall stable for discharge.  Discharge Diagnoses:  Principal Problem:   Pneumonia due to COVID-19 virus Active Problems:   HTN (hypertension)   GERD (gastroesophageal reflux disease)   Acquired autoimmune hypothyroidism   Arthritis  Principal discharge diagnosis: COVID-19 pneumonia without hypoxemia.  Discharge Instructions  Discharge Instructions    Diet - low sodium heart healthy   Complete by: As directed    Increase activity slowly   Complete by: As directed      Allergies as of 06/09/2020      Reactions   Codeine Nausea And Vomiting   Other    NO BLOOD PRODUCTS      Medication List    TAKE these medications   albuterol 108 (90 Base) MCG/ACT inhaler Commonly known as: VENTOLIN HFA Inhale 1-2 puffs into the lungs every 6 (six) hours as needed for wheezing or shortness of breath.   amLODipine 10 MG tablet Commonly known as: NORVASC Take 1 tablet (10 mg total) by mouth daily.   aspirin 81 MG chewable tablet Chew 81 mg by mouth in the morning.   cloNIDine 0.1 MG tablet Commonly known as: CATAPRES Take 1 tablet (0.1 mg total) by mouth at bedtime.  dexamethasone 6 MG tablet Commonly known as: DECADRON Take 1 tablet (6 mg total) by mouth daily for 8 days.   ferrous sulfate 325 (65 FE) MG EC tablet Take 325 mg by mouth daily.   gabapentin 300 MG capsule Commonly known as: NEURONTIN Take 1 capsule (300 mg total) by mouth at bedtime.   guaiFENesin-dextromethorphan 100-10 MG/5ML syrup Commonly known as: ROBITUSSIN DM Take 5 mLs by mouth  every 4 (four) hours as needed for cough.   levothyroxine 100 MCG tablet Commonly known as: SYNTHROID Take 100 mcg by mouth daily before breakfast.   metoprolol succinate 100 MG 24 hr tablet Commonly known as: TOPROL-XL Take 1 tablet (100 mg total) by mouth daily. Take with or immediately following a meal.   omeprazole 20 MG capsule Commonly known as: PRILOSEC Take 1 capsule (20 mg total) by mouth 2 (two) times daily.   rosuvastatin 10 MG tablet Commonly known as: CRESTOR Take 1 tablet (10 mg total) by mouth daily.   sodium chloride 0.65 % Soln nasal spray Commonly known as: OCEAN Place 1 spray into both nostrils as needed for congestion.   sucralfate 1 g tablet Commonly known as: Carafate Take 1 tablet (1 g total) by mouth 4 (four) times daily. What changed: when to take this   TEARS AGAIN OP Place 1 drop into both eyes daily as needed (for dry eye relief).   torsemide 20 MG tablet Commonly known as: DEMADEX Take 1 tablet (20 mg total) by mouth daily. Take 20 mg by mouth daily. What changed: additional instructions   Vitamin D3 50 MCG (2000 UT) Tabs Take 1 tablet by mouth daily.       Follow-up Information    Perlie Mayo, NP Follow up in 2 week(s).   Specialty: Family Medicine Contact information: [place] [address] [phone]              Allergies  Allergen Reactions  . Codeine Nausea And Vomiting  . Other     NO BLOOD PRODUCTS    Consultations:  None   Procedures/Studies: DG Chest Port 1 View  Result Date: 06/08/2020 CLINICAL DATA:  COVID positive today.  Decreased appetite. EXAM: PORTABLE CHEST 1 VIEW COMPARISON:  01/13/2019 FINDINGS: Stable heart size and mediastinal contours. Mild chronic elevation of right hemidiaphragm. Subsegmental opacities in the left lung base. No confluent consolidation. No pulmonary edema, pleural effusion, or pneumothorax. Stable osseous structures. IMPRESSION: Subsegmental opacities in the  left lung base, atelectasis versus pneumonia in the setting of COVID-19. Electronically Signed   By: Keith Rake M.D.   On: 06/08/2020 16:19     Discharge Exam: Vitals:   06/09/20 0733 06/09/20 0953  BP: (!) 115/51 (!) 145/105  Pulse: 70 74  Resp: 16   Temp: 98 F (36.7 C)   SpO2: 96%    Vitals:   06/08/20 2317 06/09/20 0401 06/09/20 0733 06/09/20 0953  BP: 98/84 108/68 (!) 115/51 (!) 145/105  Pulse: 71 71 70 74  Resp: 16 16 16    Temp: 98.6 F (37 C) 98.1 F (36.7 C) 98 F (36.7 C)   TempSrc: Oral Oral Oral   SpO2: 98% 96% 96%   Weight: 60.3 kg     Height: 5\' 4"  (1.626 m)       General: Pt is alert, awake, not in acute distress Cardiovascular: RRR, S1/S2 +, no rubs, no gallops Respiratory: CTA bilaterally, no wheezing, no rhonchi Abdominal: Soft, NT, ND, bowel sounds + Extremities: no edema,  no cyanosis    The results of significant diagnostics from this hospitalization (including imaging, microbiology, ancillary and laboratory) are listed below for reference.     Microbiology: Recent Results (from the past 240 hour(s))  SARS Coronavirus 2 by RT PCR (hospital order, performed in [facility] hospital lab) Nasopharyngeal Nasopharyngeal Swab     Status: Abnormal   Collection Time: 06/08/20  1:01 PM   Specimen: Nasopharyngeal Swab  Result Value Ref Range Status   SARS Coronavirus 2 POSITIVE (A) NEGATIVE Final    Comment: CRITICAL RESULT CALLED TO, READ BACK BY AND VERIFIED WITH: ANNE TUTTLE,RN @1432  07/31/2021KAY (NOTE) SARS-CoV-2 target nucleic acids are DETECTED  SARS-CoV-2 RNA is generally detectable in upper respiratory specimens  during the acute phase of infection.  Positive results are indicative  of the presence of the identified virus, but do not rule out bacterial infection or co-infection with other pathogens not detected by the test.  Clinical correlation with patient history and  other diagnostic information is necessary to determine  patient infection status.  The expected result is negative.  Fact Sheet for Patients:   StrictlyIdeas.no   Fact Sheet for Healthcare Providers:   BankingDealers.co.za    This test is not yet approved or cleared by the [place] FDA and  has been authorized for detection and/or diagnosis of SARS-CoV-2 by FDA under an Emergency Use Authorization (EUA).  This EUA will remain in effect (meanin g this test can be used) for the duration of  the COVID-19 declaration under Section 564(b)(1) of the Act, 21 U.S.C. section 360-bbb-3(b)(1), unless the authorization is terminated or revoked sooner.  Performed at [facility], [address], [place], [place] [address]   Blood Culture (routine x 2)     Status: None (Preliminary result)   Collection Time: 06/08/20  4:38 PM   Specimen: Right Antecubital; Blood  Result Value Ref Range Status   Specimen Description RIGHT ANTECUBITAL  Final   Special Requests   Final    BOTTLES DRAWN AEROBIC AND ANAEROBIC Blood Culture results may not be optimal due to an excessive volume of blood received in culture bottles Performed at [facility], [address] [place], [place] [address]    Culture PENDING  Incomplete   Report Status PENDING  Incomplete  Blood Culture (routine x 2)     Status: None (Preliminary result)   Collection Time: 06/08/20  4:38 PM   Specimen: Left Antecubital; Blood  Result Value Ref Range Status   Specimen Description LEFT ANTECUBITAL  Final   Special Requests   Final    BOTTLES DRAWN AEROBIC AND ANAEROBIC Blood Culture results may not be optimal due to an inadequate volume of blood received in culture bottles Performed at [facility], [address], [place], [place] [address]    Culture PENDING  Incomplete   Report Status PENDING  Incomplete     Labs: BNP (last 3 results) No results for input(s): BNP in the last 8760 hours. Basic Metabolic Panel: Recent Labs  Lab  06/08/20 1243 06/09/20 0722  NA 132* 136  K 3.6 3.2*  CL 84* 88*  CO2 34* 34*  GLUCOSE 130* 179*  BUN 18 18  CREATININE 1.02* 0.93  CALCIUM 9.3 8.9   Liver Function Tests: Recent Labs  Lab 06/08/20 1243 06/09/20 0722  AST 45* 32  ALT 35 30  ALKPHOS 56 51  BILITOT 0.5 0.5  PROT 7.0 6.5  ALBUMIN 3.6 3.1*   No results for input(s): LIPASE, AMYLASE in  the last 168 hours. No results for input(s): AMMONIA in the last 168 hours. CBC: Recent Labs  Lab 06/08/20 1243 06/09/20 0722  WBC 5.5 3.3*  NEUTROABS  --  2.3  HGB 13.7 13.6  HCT 42.0 41.5  MCV 91.1 90.2  PLT 243 262   Cardiac Enzymes: No results for input(s): CKTOTAL, CKMB, CKMBINDEX, TROPONINI in the last 168 hours. BNP: Invalid input(s): POCBNP CBG: No results for input(s): GLUCAP in the last 168 hours. D-Dimer Recent Labs    06/08/20 1243  DDIMER 2.08*   Hgb A1c No results for input(s): HGBA1C in the last 72 hours. Lipid Profile Recent Labs    06/08/20 1243  TRIG 100   Thyroid function studies No results for input(s): TSH, T4TOTAL, T3FREE, THYROIDAB in the last 72 hours.  Invalid input(s): FREET3 Anemia work up Recent Labs    06/08/20 1532  FERRITIN 1,414*   Urinalysis    Component Value Date/Time   COLORURINE STRAW (A) 06/08/2020 1202   APPEARANCEUR CLEAR 06/08/2020 1202   LABSPEC 1.004 (L) 06/08/2020 1202   PHURINE 5.0 06/08/2020 1202   GLUCOSEU NEGATIVE 06/08/2020 1202   HGBUR NEGATIVE 06/08/2020 1202   BILIRUBINUR NEGATIVE 06/08/2020 1202   KETONESUR NEGATIVE 06/08/2020 1202   PROTEINUR NEGATIVE 06/08/2020 1202   UROBILINOGEN 0.2 01/27/2015 2306   NITRITE NEGATIVE 06/08/2020 1202   LEUKOCYTESUR NEGATIVE 06/08/2020 1202   Sepsis Labs Invalid input(s): PROCALCITONIN,  WBC,  LACTICIDVEN Microbiology Recent Results (from the past 240 hour(s))  SARS Coronavirus 2 by RT PCR (hospital order, performed in [facility] hospital lab) Nasopharyngeal Nasopharyngeal Swab     Status: Abnormal    Collection Time: 06/08/20  1:01 PM   Specimen: Nasopharyngeal Swab  Result Value Ref Range Status   SARS Coronavirus 2 POSITIVE (A) NEGATIVE Final    Comment: CRITICAL RESULT CALLED TO, READ BACK BY AND VERIFIED WITH: ANNE TUTTLE,RN @1432  07/31/2021KAY (NOTE) SARS-CoV-2 target nucleic acids are DETECTED  SARS-CoV-2 RNA is generally detectable in upper respiratory specimens  during the acute phase of infection.  Positive results are indicative  of the presence of the identified virus, but do not rule out bacterial infection or co-infection with other pathogens not detected by the test.  Clinical correlation with patient history and  other diagnostic information is necessary to determine patient infection status.  The expected result is negative.  Fact Sheet for Patients:   StrictlyIdeas.no   Fact Sheet for Healthcare Providers:   BankingDealers.co.za    This test is not yet approved or cleared by the [place] FDA and  has been authorized for detection and/or diagnosis of SARS-CoV-2 by FDA under an Emergency Use Authorization (EUA).  This EUA will remain in effect (meanin g this test can be used) for the duration of  the COVID-19 declaration under Section 564(b)(1) of the Act, 21 U.S.C. section 360-bbb-3(b)(1), unless the authorization is terminated or revoked sooner.  Performed at [facility], [address] [place], [place] [address]   Blood Culture (routine x 2)     Status: None (Preliminary result)   Collection Time: 06/08/20  4:38 PM   Specimen: Right Antecubital; Blood  Result Value Ref Range Status   Specimen Description RIGHT ANTECUBITAL  Final   Special Requests   Final    BOTTLES DRAWN AEROBIC AND ANAEROBIC Blood Culture results may not be optimal due to an excessive volume of blood received in culture bottles Performed at [facility], [address], [place], [place] [address]    Culture  PENDING  Incomplete    Report Status PENDING  Incomplete  Blood Culture (routine x 2)     Status: None (Preliminary result)   Collection Time: 06/08/20  4:38 PM   Specimen: Left Antecubital; Blood  Result Value Ref Range Status   Specimen Description LEFT ANTECUBITAL  Final   Special Requests   Final    BOTTLES DRAWN AEROBIC AND ANAEROBIC Blood Culture results may not be optimal due to an inadequate volume of blood received in culture bottles Performed at [facility], [address], [place], [place] [address]    Culture PENDING  Incomplete   Report Status PENDING  Incomplete     Time coordinating discharge: 35 minutes  SIGNED:   Rodena Goldmann, DO [facility] 06/09/2020, 10:16 AM  If 7PM-7AM, please contact night-coverage [email or web address]

## 2020-06-09 NOTE — Progress Notes (Signed)
Patient scheduled for outpatient Remdesivir infusion at 10:00 AM on Monday 8/2, Tuesday 8/3 and Wednesday 8/4 at [facility]. Please inform the patient to park at [place], as staff will be escorting the patient through the [place] entrance of the hospital.    There is a wave flag banner located near the entrance on [address]. Turn into this entrance and immediately turn left and park in 1 of the 5 designated Covid Infusion Parking spots. There is a phone number on the sign, please call and let the staff know what spot you are in and we will come out and get you. For questions call [phone].  Thanks.

## 2020-06-09 NOTE — Plan of Care (Signed)

## 2020-06-09 NOTE — Discharge Instructions (Addendum)
Patient scheduled for outpatient Remdesivir infusion at 10:00 AM on Monday 8/2, Tuesday 8/3 and Wednesday 8/4 at [facility]. Please inform the patient to park [place], [place], as staff will be escorting the patient through the [place] entrance of the hospital.  There is a wave flag banner located near the entrance on [address]. Turn into this entranceand immediatelyturn left and park in 1 of the 5 designated Covid Infusion Parking spots. There is a phone number on the sign, please call and let the staff know what spot you are in and we will come out and get you. For questions call [phone]. Thanks.

## 2020-06-10 ENCOUNTER — Ambulatory Visit (HOSPITAL_COMMUNITY)
Admit: 2020-06-10 | Discharge: 2020-06-10 | Disposition: A | Discharge status: Home / Self Care | Payer: Medicare Other | Source: Ambulatory Visit | Attending: Pulmonary Disease | Admitting: Pulmonary Disease

## 2020-06-10 ENCOUNTER — Ambulatory Visit (HOSPITAL_COMMUNITY): Payer: Medicare Other

## 2020-06-10 DIAGNOSIS — J1282 Pneumonia due to coronavirus disease 2019: Secondary | ICD-10-CM | POA: Insufficient documentation

## 2020-06-10 DIAGNOSIS — U071 COVID-19: Secondary | ICD-10-CM | POA: Insufficient documentation

## 2020-06-10 MED ORDER — SODIUM CHLORIDE 0.9 % IV SOLN
100.0000 mg | Freq: Once | INTRAVENOUS | Status: AC
  Administered 2020-06-10: 100 mg via INTRAVENOUS
  Filled 2020-06-10: qty 20

## 2020-06-10 MED ORDER — DIPHENHYDRAMINE HCL 50 MG/ML IJ SOLN: 50.0000 mg | Freq: Once | INTRAMUSCULAR | Status: DC | PRN

## 2020-06-10 MED ORDER — FAMOTIDINE IN NACL 20-0.9 MG/50ML-% IV SOLN: 20.0000 mg | Freq: Once | INTRAVENOUS | Status: DC | PRN

## 2020-06-10 MED ORDER — METHYLPREDNISOLONE SODIUM SUCC 125 MG IJ SOLR: 125.0000 mg | Freq: Once | INTRAMUSCULAR | Status: DC | PRN

## 2020-06-10 MED ORDER — EPINEPHRINE 0.3 MG/0.3ML IJ SOAJ: 0.3000 mg | Freq: Once | INTRAMUSCULAR | Status: DC | PRN

## 2020-06-10 MED ORDER — ALBUTEROL SULFATE HFA 108 (90 BASE) MCG/ACT IN AERS: 2.0000 | INHALATION_SPRAY | Freq: Once | RESPIRATORY_TRACT | Status: DC | PRN

## 2020-06-10 MED ORDER — SODIUM CHLORIDE 0.9 % IV SOLN: INTRAVENOUS | Status: DC | PRN

## 2020-06-10 NOTE — Progress Notes (Signed)
  Diagnosis: COVID-19  Physician: Asencion Noble   Procedure: Covid Infusion Clinic Med: remdesivir infusion - Provided patient with remdesivir fact sheet for patients, parents and caregivers prior to infusion.  Complications: No immediate complications noted.  Discharge: Discharged home   Otho Perl 06/10/2020

## 2020-06-10 NOTE — Discharge Instructions (Signed)
10 Things You Can Do to Manage Your COVID-19 Symptoms at Home If you have possible or confirmed COVID-19: 1. Stay home from work and school. And stay away from other public places. If you must go out, avoid using any kind of public transportation, ridesharing, or taxis. 2. Monitor your symptoms carefully. If your symptoms get worse, call your healthcare provider immediately. 3. Get rest and stay hydrated. 4. If you have a medical appointment, call the healthcare provider ahead of time and tell them that you have or may have COVID-19. 5. For medical emergencies, call 911 and notify the dispatch personnel that you have or may have COVID-19. 6. Cover your cough and sneezes with a tissue or use the inside of your elbow. 7. Wash your hands often with soap and water for at least 20 seconds or clean your hands with an alcohol-based hand sanitizer that contains at least 60% alcohol. 8. As much as possible, stay in a specific room and away from other people in your home. Also, you should use a separate bathroom, if available. If you need to be around other people in or outside of the home, wear a mask. 9. Avoid sharing personal items with other people in your household, like dishes, towels, and bedding. 10. Clean all surfaces that are touched often, like counters, tabletops, and doorknobs. Use household cleaning sprays or wipes according to the label instructions. cdc.gov/coronavirus 05/10/2019 This information is not intended to replace advice given to you by your health care provider. Make sure you discuss any questions you have with your health care provider. Document Revised: 10/12/2019 Document Reviewed: 10/12/2019 Elsevier Patient Education  2020 Elsevier Inc.  

## 2020-06-11 ENCOUNTER — Ambulatory Visit (HOSPITAL_COMMUNITY)
Admit: 2020-06-11 | Discharge: 2020-06-11 | Disposition: A | Discharge status: Home / Self Care | Payer: Medicare Other | Attending: Pulmonary Disease | Admitting: Pulmonary Disease

## 2020-06-11 ENCOUNTER — Telehealth: Payer: Self-pay

## 2020-06-11 ENCOUNTER — Ambulatory Visit (HOSPITAL_COMMUNITY): Payer: Medicare Other

## 2020-06-11 DIAGNOSIS — J1282 Pneumonia due to coronavirus disease 2019: Secondary | ICD-10-CM | POA: Diagnosis not present

## 2020-06-11 DIAGNOSIS — U071 COVID-19: Secondary | ICD-10-CM | POA: Diagnosis not present

## 2020-06-11 MED ORDER — FAMOTIDINE IN NACL 20-0.9 MG/50ML-% IV SOLN: 20.0000 mg | Freq: Once | INTRAVENOUS | Status: DC | PRN

## 2020-06-11 MED ORDER — EPINEPHRINE 0.3 MG/0.3ML IJ SOAJ: 0.3000 mg | Freq: Once | INTRAMUSCULAR | Status: DC | PRN

## 2020-06-11 MED ORDER — SODIUM CHLORIDE 0.9 % IV SOLN: INTRAVENOUS | Status: DC | PRN

## 2020-06-11 MED ORDER — SODIUM CHLORIDE 0.9 % IV SOLN
100.0000 mg | Freq: Once | INTRAVENOUS | Status: DC
  Filled 2020-06-11: qty 20

## 2020-06-11 MED ORDER — METHYLPREDNISOLONE SODIUM SUCC 125 MG IJ SOLR: 125.0000 mg | Freq: Once | INTRAMUSCULAR | Status: DC | PRN

## 2020-06-11 MED ORDER — ALBUTEROL SULFATE HFA 108 (90 BASE) MCG/ACT IN AERS: 2.0000 | INHALATION_SPRAY | Freq: Once | RESPIRATORY_TRACT | Status: DC | PRN

## 2020-06-11 MED ORDER — DIPHENHYDRAMINE HCL 50 MG/ML IJ SOLN: 50.0000 mg | Freq: Once | INTRAMUSCULAR | Status: DC | PRN

## 2020-06-11 MED ORDER — SODIUM CHLORIDE 0.9 % IV SOLN
100.0000 mg | Freq: Once | INTRAVENOUS | Status: AC
  Administered 2020-06-11: 100 mg via INTRAVENOUS

## 2020-06-11 NOTE — Progress Notes (Signed)
  Diagnosis: COVID-19  Physician: Dr. Joya Gaskins  Procedure: Covid Infusion Clinic Med: remdesivir infusion - Provided patient with remdesivir fact sheet for patients, parents and caregivers prior to infusion.  Complications: No immediate complications noted.  Discharge: Discharged home   [place] 06/11/2020

## 2020-06-11 NOTE — Discharge Instructions (Signed)

## 2020-06-11 NOTE — Telephone Encounter (Signed)
Transition Care Management Follow-up Telephone Call   Date discharged?   06/09/2020            How have you been since you were released from the hospital? very weak and sick- thinks she was discharged too soon. Has to go back to hosp to get labs today. Will go back to the ER if she continues feeling so bad   Do you understand why you were in the hospital? covid    Do you understand the discharge instructions? Yes, daughter was informed as well   Where were you discharged to? home   Items Reviewed:  Medications reviewed: yes  Allergies reviewed:  yes  Dietary changes reviewed: yes  Referrals reviewed: yes   Functional Questionnaire:   Activities of Daily Living (ADLs):  very weak. Daughter will come check on her in an hour or so to take her for more blood work    Any transportation issues/concerns?: [place]    Any patient concerns? why is she still so weak and feeling so sick. Advised her to go back to ER since she lives alone and feels so bad   Confirmed importance and date/time of follow-up visits scheduled    since she may be going back for evaluation- will call later today to schedule.  Confirmed with patient if condition begins to worsen call PCP or go to the ER.  Patient was given the office number and encouraged to call back with question or concerns.  : yes and patient understands

## 2020-06-12 ENCOUNTER — Ambulatory Visit (HOSPITAL_COMMUNITY)
Admit: 2020-06-12 | Discharge: 2020-06-12 | Disposition: A | Discharge status: Home / Self Care | Payer: Medicare Other | Attending: Pulmonary Disease | Admitting: Pulmonary Disease

## 2020-06-12 ENCOUNTER — Other Ambulatory Visit: Payer: Self-pay

## 2020-06-12 ENCOUNTER — Ambulatory Visit (HOSPITAL_COMMUNITY): Payer: Medicare Other

## 2020-06-12 ENCOUNTER — Encounter: Payer: Medicare Other | Admitting: Family Medicine

## 2020-06-12 ENCOUNTER — Encounter: Payer: Self-pay | Admitting: Family Medicine

## 2020-06-12 DIAGNOSIS — Z885 Allergy status to narcotic agent status: Secondary | ICD-10-CM | POA: Diagnosis not present

## 2020-06-12 DIAGNOSIS — R7401 Elevation of levels of liver transaminase levels: Secondary | ICD-10-CM | POA: Diagnosis not present

## 2020-06-12 DIAGNOSIS — M797 Fibromyalgia: Secondary | ICD-10-CM | POA: Diagnosis not present

## 2020-06-12 DIAGNOSIS — K5900 Constipation, unspecified: Secondary | ICD-10-CM | POA: Diagnosis not present

## 2020-06-12 DIAGNOSIS — K2970 Gastritis, unspecified, without bleeding: Secondary | ICD-10-CM | POA: Diagnosis not present

## 2020-06-12 DIAGNOSIS — R54 Age-related physical debility: Secondary | ICD-10-CM | POA: Diagnosis not present

## 2020-06-12 DIAGNOSIS — R112 Nausea with vomiting, unspecified: Secondary | ICD-10-CM | POA: Diagnosis not present

## 2020-06-12 DIAGNOSIS — Z8673 Personal history of transient ischemic attack (TIA), and cerebral infarction without residual deficits: Secondary | ICD-10-CM | POA: Diagnosis not present

## 2020-06-12 DIAGNOSIS — Z7982 Long term (current) use of aspirin: Secondary | ICD-10-CM | POA: Diagnosis not present

## 2020-06-12 DIAGNOSIS — Z823 Family history of stroke: Secondary | ICD-10-CM | POA: Diagnosis not present

## 2020-06-12 DIAGNOSIS — I10 Essential (primary) hypertension: Secondary | ICD-10-CM | POA: Diagnosis not present

## 2020-06-12 DIAGNOSIS — U071 COVID-19: Secondary | ICD-10-CM | POA: Diagnosis not present

## 2020-06-12 DIAGNOSIS — Z8249 Family history of ischemic heart disease and other diseases of the circulatory system: Secondary | ICD-10-CM | POA: Diagnosis not present

## 2020-06-12 DIAGNOSIS — R531 Weakness: Secondary | ICD-10-CM | POA: Diagnosis not present

## 2020-06-12 DIAGNOSIS — E876 Hypokalemia: Secondary | ICD-10-CM | POA: Diagnosis not present

## 2020-06-12 DIAGNOSIS — E785 Hyperlipidemia, unspecified: Secondary | ICD-10-CM | POA: Diagnosis not present

## 2020-06-12 DIAGNOSIS — I6782 Cerebral ischemia: Secondary | ICD-10-CM | POA: Diagnosis not present

## 2020-06-12 DIAGNOSIS — Z87442 Personal history of urinary calculi: Secondary | ICD-10-CM | POA: Diagnosis not present

## 2020-06-12 DIAGNOSIS — Z833 Family history of diabetes mellitus: Secondary | ICD-10-CM | POA: Diagnosis not present

## 2020-06-12 DIAGNOSIS — Z905 Acquired absence of kidney: Secondary | ICD-10-CM | POA: Diagnosis not present

## 2020-06-12 DIAGNOSIS — E063 Autoimmune thyroiditis: Secondary | ICD-10-CM | POA: Diagnosis not present

## 2020-06-12 DIAGNOSIS — R7989 Other specified abnormal findings of blood chemistry: Secondary | ICD-10-CM | POA: Diagnosis not present

## 2020-06-12 DIAGNOSIS — G319 Degenerative disease of nervous system, unspecified: Secondary | ICD-10-CM | POA: Diagnosis not present

## 2020-06-12 DIAGNOSIS — Z800 Family history of malignant neoplasm of digestive organs: Secondary | ICD-10-CM | POA: Diagnosis not present

## 2020-06-12 DIAGNOSIS — Z79899 Other long term (current) drug therapy: Secondary | ICD-10-CM | POA: Diagnosis not present

## 2020-06-12 DIAGNOSIS — K219 Gastro-esophageal reflux disease without esophagitis: Secondary | ICD-10-CM | POA: Diagnosis not present

## 2020-06-12 DIAGNOSIS — I6529 Occlusion and stenosis of unspecified carotid artery: Secondary | ICD-10-CM | POA: Diagnosis not present

## 2020-06-12 MED ORDER — ONDANSETRON HCL 4 MG/2ML IJ SOLN
4.0000 mg | Freq: Once | INTRAMUSCULAR | Status: AC
  Administered 2020-06-12: 4 mg via INTRAVENOUS
  Filled 2020-06-12: qty 2

## 2020-06-12 MED ORDER — DIPHENHYDRAMINE HCL 50 MG/ML IJ SOLN: 50.0000 mg | Freq: Once | INTRAMUSCULAR | Status: DC | PRN

## 2020-06-12 MED ORDER — SODIUM CHLORIDE 0.9 % IV SOLN: INTRAVENOUS | Status: DC | PRN

## 2020-06-12 MED ORDER — METHYLPREDNISOLONE SODIUM SUCC 125 MG IJ SOLR: 125.0000 mg | Freq: Once | INTRAMUSCULAR | Status: DC | PRN

## 2020-06-12 MED ORDER — EPINEPHRINE 0.3 MG/0.3ML IJ SOAJ: 0.3000 mg | Freq: Once | INTRAMUSCULAR | Status: DC | PRN

## 2020-06-12 MED ORDER — SODIUM CHLORIDE 0.9 % IV SOLN
100.0000 mg | Freq: Once | INTRAVENOUS | Status: AC
  Administered 2020-06-12: 100 mg via INTRAVENOUS
  Filled 2020-06-12: qty 20

## 2020-06-12 MED ORDER — ALBUTEROL SULFATE HFA 108 (90 BASE) MCG/ACT IN AERS: 2.0000 | INHALATION_SPRAY | Freq: Once | RESPIRATORY_TRACT | Status: DC | PRN

## 2020-06-12 MED ORDER — FAMOTIDINE IN NACL 20-0.9 MG/50ML-% IV SOLN: 20.0000 mg | Freq: Once | INTRAVENOUS | Status: DC | PRN

## 2020-06-12 NOTE — Progress Notes (Signed)
  Diagnosis: COVID-19  Physician:Dr Joya Gaskins  Procedure: Covid Infusion Clinic Med: remdesivir infusion - Provided patient with remdesivir fact sheet for patients, parents and caregivers prior to infusion.  Complications: No immediate complications noted.  Discharge: Discharged home   Dewaine Oats 06/12/2020

## 2020-06-12 NOTE — Discharge Instructions (Signed)
10 Things You Can Do to Manage Your COVID-19 Symptoms at Home If you have possible or confirmed COVID-19: 1. Stay home from work and school. And stay away from other public places. If you must go out, avoid using any kind of public transportation, ridesharing, or taxis. 2. Monitor your symptoms carefully. If your symptoms get worse, call your healthcare provider immediately. 3. Get rest and stay hydrated. 4. If you have a medical appointment, call the healthcare provider ahead of time and tell them that you have or may have COVID-19. 5. For medical emergencies, call 911 and notify the dispatch personnel that you have or may have COVID-19. 6. Cover your cough and sneezes with a tissue or use the inside of your elbow. 7. Wash your hands often with soap and water for at least 20 seconds or clean your hands with an alcohol-based hand sanitizer that contains at least 60% alcohol. 8. As much as possible, stay in a specific room and away from other people in your home. Also, you should use a separate bathroom, if available. If you need to be around other people in or outside of the home, wear a mask. 9. Avoid sharing personal items with other people in your household, like dishes, towels, and bedding. 10. Clean all surfaces that are touched often, like counters, tabletops, and doorknobs. Use household cleaning sprays or wipes according to the label instructions. cdc.gov/coronavirus 05/10/2019 This information is not intended to replace advice given to you by your health care provider. Make sure you discuss any questions you have with your health care provider. Document Revised: 10/12/2019 Document Reviewed: 10/12/2019 Elsevier Patient Education  2020 Elsevier Inc.  

## 2020-06-13 ENCOUNTER — Encounter (HOSPITAL_COMMUNITY): Payer: Self-pay | Admitting: *Deleted

## 2020-06-13 ENCOUNTER — Emergency Department (HOSPITAL_COMMUNITY): Payer: Medicare Other

## 2020-06-13 ENCOUNTER — Emergency Department (HOSPITAL_COMMUNITY)
Admission: EM | Admit: 2020-06-13 | Discharge: 2020-06-13 | Disposition: A | Discharge status: Home / Self Care | Payer: Medicare Other | Source: Home / Self Care

## 2020-06-13 ENCOUNTER — Other Ambulatory Visit: Payer: Self-pay

## 2020-06-13 ENCOUNTER — Inpatient Hospital Stay (HOSPITAL_COMMUNITY)
Admission: EM | Admit: 2020-06-13 | Discharge: 2020-06-17 | DRG: 179 | Disposition: A | Discharge status: Home / Self Care | Payer: Medicare Other | Attending: Internal Medicine | Admitting: Internal Medicine

## 2020-06-13 ENCOUNTER — Encounter (HOSPITAL_COMMUNITY): Payer: Self-pay

## 2020-06-13 DIAGNOSIS — Z833 Family history of diabetes mellitus: Secondary | ICD-10-CM

## 2020-06-13 DIAGNOSIS — E876 Hypokalemia: Secondary | ICD-10-CM | POA: Diagnosis present

## 2020-06-13 DIAGNOSIS — Z905 Acquired absence of kidney: Secondary | ICD-10-CM

## 2020-06-13 DIAGNOSIS — Z79890 Hormone replacement therapy (postmenopausal): Secondary | ICD-10-CM

## 2020-06-13 DIAGNOSIS — Z87442 Personal history of urinary calculi: Secondary | ICD-10-CM

## 2020-06-13 DIAGNOSIS — D75839 Thrombocytosis, unspecified: Secondary | ICD-10-CM | POA: Diagnosis present

## 2020-06-13 DIAGNOSIS — K219 Gastro-esophageal reflux disease without esophagitis: Secondary | ICD-10-CM | POA: Diagnosis present

## 2020-06-13 DIAGNOSIS — R54 Age-related physical debility: Secondary | ICD-10-CM | POA: Diagnosis present

## 2020-06-13 DIAGNOSIS — U071 COVID-19: Principal | ICD-10-CM

## 2020-06-13 DIAGNOSIS — I10 Essential (primary) hypertension: Secondary | ICD-10-CM | POA: Diagnosis present

## 2020-06-13 DIAGNOSIS — R531 Weakness: Secondary | ICD-10-CM

## 2020-06-13 DIAGNOSIS — R4182 Altered mental status, unspecified: Secondary | ICD-10-CM | POA: Diagnosis present

## 2020-06-13 DIAGNOSIS — K5900 Constipation, unspecified: Secondary | ICD-10-CM | POA: Diagnosis present

## 2020-06-13 DIAGNOSIS — Z90710 Acquired absence of both cervix and uterus: Secondary | ICD-10-CM

## 2020-06-13 DIAGNOSIS — R7989 Other specified abnormal findings of blood chemistry: Secondary | ICD-10-CM | POA: Diagnosis present

## 2020-06-13 DIAGNOSIS — Z8249 Family history of ischemic heart disease and other diseases of the circulatory system: Secondary | ICD-10-CM

## 2020-06-13 DIAGNOSIS — Z87891 Personal history of nicotine dependence: Secondary | ICD-10-CM

## 2020-06-13 DIAGNOSIS — Z800 Family history of malignant neoplasm of digestive organs: Secondary | ICD-10-CM

## 2020-06-13 DIAGNOSIS — Z5321 Procedure and treatment not carried out due to patient leaving prior to being seen by health care provider: Secondary | ICD-10-CM | POA: Insufficient documentation

## 2020-06-13 DIAGNOSIS — Z79899 Other long term (current) drug therapy: Secondary | ICD-10-CM

## 2020-06-13 DIAGNOSIS — R7401 Elevation of levels of liver transaminase levels: Secondary | ICD-10-CM | POA: Diagnosis present

## 2020-06-13 DIAGNOSIS — Z7982 Long term (current) use of aspirin: Secondary | ICD-10-CM

## 2020-06-13 DIAGNOSIS — M797 Fibromyalgia: Secondary | ICD-10-CM | POA: Diagnosis present

## 2020-06-13 DIAGNOSIS — Z8673 Personal history of transient ischemic attack (TIA), and cerebral infarction without residual deficits: Secondary | ICD-10-CM

## 2020-06-13 DIAGNOSIS — Z8719 Personal history of other diseases of the digestive system: Secondary | ICD-10-CM

## 2020-06-13 DIAGNOSIS — E785 Hyperlipidemia, unspecified: Secondary | ICD-10-CM | POA: Diagnosis present

## 2020-06-13 DIAGNOSIS — R112 Nausea with vomiting, unspecified: Secondary | ICD-10-CM | POA: Diagnosis not present

## 2020-06-13 DIAGNOSIS — Z823 Family history of stroke: Secondary | ICD-10-CM

## 2020-06-13 DIAGNOSIS — E063 Autoimmune thyroiditis: Secondary | ICD-10-CM | POA: Diagnosis present

## 2020-06-13 DIAGNOSIS — K2970 Gastritis, unspecified, without bleeding: Secondary | ICD-10-CM | POA: Diagnosis present

## 2020-06-13 DIAGNOSIS — Z85528 Personal history of other malignant neoplasm of kidney: Secondary | ICD-10-CM

## 2020-06-13 DIAGNOSIS — Z801 Family history of malignant neoplasm of trachea, bronchus and lung: Secondary | ICD-10-CM

## 2020-06-13 DIAGNOSIS — R5383 Other fatigue: Secondary | ICD-10-CM | POA: Insufficient documentation

## 2020-06-13 DIAGNOSIS — Z885 Allergy status to narcotic agent status: Secondary | ICD-10-CM

## 2020-06-13 LAB — BASIC METABOLIC PANEL
Anion gap: 15 (ref 5–15)
BUN: 10 mg/dL (ref 8–23)
CO2: 32 mmol/L (ref 22–32)
Calcium: 9.6 mg/dL (ref 8.9–10.3)
Chloride: 92 mmol/L — ABNORMAL LOW (ref 98–111)
Creatinine, Ser: 0.93 mg/dL (ref 0.44–1.00)
GFR calc Af Amer: >60 mL/min (ref >60)
GFR calc non Af Amer: 56 mL/min — ABNORMAL LOW (ref >60)
Glucose, Bld: 154 mg/dL — ABNORMAL HIGH (ref 70–99)
Potassium: 2.9 mmol/L — ABNORMAL LOW (ref 3.5–5.1)
Sodium: 139 mmol/L (ref 135–145)

## 2020-06-13 LAB — CBC
HCT: 45 % (ref 36.0–46.0)
Hemoglobin: 14.6 g/dL (ref 12.0–15.0)
MCH: 29.1 pg (ref 26.0–34.0)
MCHC: 32.4 g/dL (ref 30.0–36.0)
MCV: 89.6 fL (ref 80.0–100.0)
Platelets: 541 10*3/uL — ABNORMAL HIGH (ref 150–400)
RBC: 5.02 MIL/uL (ref 3.87–5.11)
RDW: 12.6 % (ref 11.5–15.5)
WBC: 9.4 10*3/uL (ref 4.0–10.5)
nRBC: 0 % (ref 0.0–0.2)

## 2020-06-13 LAB — COMPREHENSIVE METABOLIC PANEL
ALT: 45 U/L — ABNORMAL HIGH (ref 0–44)
AST: 34 U/L (ref 15–41)
Albumin: 3.8 g/dL (ref 3.5–5.0)
Alkaline Phosphatase: 60 U/L (ref 38–126)
Anion gap: 14 (ref 5–15)
BUN: 18 mg/dL (ref 8–23)
CO2: 32 mmol/L (ref 22–32)
Calcium: 9.4 mg/dL (ref 8.9–10.3)
Chloride: 91 mmol/L — ABNORMAL LOW (ref 98–111)
Creatinine, Ser: 1.01 mg/dL — ABNORMAL HIGH (ref 0.44–1.00)
GFR calc Af Amer: 59 mL/min — ABNORMAL LOW (ref >60)
GFR calc non Af Amer: 51 mL/min — ABNORMAL LOW (ref >60)
Glucose, Bld: 154 mg/dL — ABNORMAL HIGH (ref 70–99)
Potassium: 2.9 mmol/L — ABNORMAL LOW (ref 3.5–5.1)
Sodium: 137 mmol/L (ref 135–145)
Total Bilirubin: 1 mg/dL (ref 0.3–1.2)
Total Protein: 7.1 g/dL (ref 6.5–8.1)

## 2020-06-13 LAB — CBC WITH DIFFERENTIAL/PLATELET
Abs Immature Granulocytes: 0.09 10*3/uL — ABNORMAL HIGH (ref 0.00–0.07)
Basophils Absolute: 0 10*3/uL (ref 0.0–0.1)
Basophils Relative: 0 %
Eosinophils Absolute: 0 10*3/uL (ref 0.0–0.5)
Eosinophils Relative: 0 %
HCT: 45.6 % (ref 36.0–46.0)
Hemoglobin: 14.8 g/dL (ref 12.0–15.0)
Immature Granulocytes: 1 %
Lymphocytes Relative: 16 %
Lymphs Abs: 1.4 10*3/uL (ref 0.7–4.0)
MCH: 29.7 pg (ref 26.0–34.0)
MCHC: 32.5 g/dL (ref 30.0–36.0)
MCV: 91.6 fL (ref 80.0–100.0)
Monocytes Absolute: 0.9 10*3/uL (ref 0.1–1.0)
Monocytes Relative: 10 %
Neutro Abs: 6.7 10*3/uL (ref 1.7–7.7)
Neutrophils Relative %: 73 %
Platelets: 513 10*3/uL — ABNORMAL HIGH (ref 150–400)
RBC: 4.98 MIL/uL (ref 3.87–5.11)
RDW: 12.6 % (ref 11.5–15.5)
WBC: 9.1 10*3/uL (ref 4.0–10.5)
nRBC: 0 % (ref 0.0–0.2)

## 2020-06-13 LAB — MAGNESIUM: Magnesium: 2 mg/dL (ref 1.7–2.4)

## 2020-06-13 LAB — CULTURE, BLOOD (ROUTINE X 2)
Culture: NO GROWTH
Culture: NO GROWTH

## 2020-06-13 LAB — LACTIC ACID, PLASMA: Lactic Acid, Venous: 1.8 mmol/L (ref 0.5–1.9)

## 2020-06-13 LAB — AMMONIA: Ammonia: 24 umol/L (ref 9–35)

## 2020-06-13 LAB — PHOSPHORUS: Phosphorus: 3.7 mg/dL (ref 2.5–4.6)

## 2020-06-13 MED ORDER — POTASSIUM CHLORIDE CRYS ER 20 MEQ PO TBCR: 40.0000 meq | EXTENDED_RELEASE_TABLET | Freq: Once | ORAL | Status: DC

## 2020-06-13 MED ORDER — ACETAMINOPHEN 650 MG RE SUPP: 650.0000 mg | Freq: Four times a day (QID) | RECTAL | Status: DC | PRN

## 2020-06-13 MED ORDER — POTASSIUM CHLORIDE IN NACL 40-0.9 MEQ/L-% IV SOLN
INTRAVENOUS | Status: AC
  Administered 2020-06-13: 125 mL/h via INTRAVENOUS
  Filled 2020-06-13 (×2): qty 1000

## 2020-06-13 MED ORDER — POTASSIUM CHLORIDE 10 MEQ/100ML IV SOLN
10.0000 meq | INTRAVENOUS | Status: AC
  Administered 2020-06-13 (×2): 10 meq via INTRAVENOUS
  Filled 2020-06-13 (×2): qty 100

## 2020-06-13 MED ORDER — ONDANSETRON HCL 4 MG/2ML IJ SOLN: 4.0000 mg | Freq: Four times a day (QID) | INTRAMUSCULAR | Status: DC | PRN

## 2020-06-13 MED ORDER — ACETAMINOPHEN 325 MG PO TABS
650.0000 mg | ORAL_TABLET | Freq: Four times a day (QID) | ORAL | Status: DC | PRN
  Administered 2020-06-16: 650 mg via ORAL
  Filled 2020-06-13: qty 2

## 2020-06-13 MED ORDER — ONDANSETRON HCL 4 MG PO TABS: 4.0000 mg | ORAL_TABLET | Freq: Four times a day (QID) | ORAL | Status: DC | PRN

## 2020-06-13 MED ORDER — ACETAMINOPHEN 325 MG PO TABS
650.0000 mg | ORAL_TABLET | Freq: Once | ORAL | Status: AC
  Administered 2020-06-13: 650 mg via ORAL
  Filled 2020-06-13: qty 2

## 2020-06-13 NOTE — ED Provider Notes (Signed)
[facility] EMERGENCY DEPARTMENT Provider Note   CSN: [id number] Arrival date & time: 06/13/20  1706     History Chief Complaint  Patient presents with  . Abdominal Pain  . Fever    Brenda Erickson is a 86 y.o. female.  86 year old female brought to emergency room by her daughter for weakness with confusion and vomiting.  Patient was diagnosed with Covid on July 31 and admitted to the hospital, was discharged on August 1 feeling better and having completed 1 infusion.  Patient followed up the infusion center for infusions on the second third and 4 August.  Patient's daughter brought her back to the emergency room today stating that she is persistently confused and now she is weak and vomiting and unable to care for herself.  Patient lives in her home alone, states her daughter lives across the street.  History is provided by patient's daughters, patient is confused and states "they took me back to Covid."  Brenda Erickson was evaluated in Emergency Department on 06/13/2020 for the symptoms described in the history of present illness. She was evaluated in the context of the global COVID-19 pandemic, which necessitated consideration that the patient might be at risk for infection with the SARS-CoV-2 virus that causes COVID-19. Institutional protocols and algorithms that pertain to the evaluation of patients at risk for COVID-19 are in a state of rapid change based on information released by regulatory bodies including the CDC and federal and state organizations. These policies and algorithms were followed during the patient's care in the ED.         Past Medical History:  Diagnosis Date  . Acquired trigger finger 05/25/2012  . Arthritis   . Cancer ([place])    mass on left kidney  . Change in bowel habits 09/21/2012  . CONSTIPATION 04/22/2009   Qualifier: Diagnosis of  By: Christ Kick    . Diverticulosis   . DIVERTICULOSIS, COLON 04/22/2009   Qualifier: Diagnosis of  By: Christ Kick    . EPIGASTRIC PAIN 04/22/2009   Qualifier: Diagnosis of  By: Christ Kick    . Esophageal reflux   . Fibromyalgia   . GOITER 04/22/2009   Qualifier: History of  By: Christ Kick    . Hashimoto's disease   . Hashimoto's disease   . Helicobacter pylori gastritis    in remote past, s/p treatment   . HELICOBACTER PYLORI GASTRITIS, HX OF 04/22/2009   Qualifier: Diagnosis of  By: Christ Kick    . HIP PAIN, RIGHT 04/22/2009   Qualifier: Diagnosis of  By: Christ Kick    . History of gastroesophageal reflux (GERD) 05/16/2013  . HTN (hypertension)   . Hyperlipidemia LDL goal <70 01/29/2015  . Hypothyroid   . Hypothyroidism 04/22/2009   Qualifier: Diagnosis of  By: Christ Kick    . Kidney stones   . Low back pain radiating to both legs 05/16/2013  . Myalgia and myositis 04/22/2009   Qualifier: Diagnosis of  By: Christ Kick    . Renal mass, left 05/02/2013  . RLQ abdominal tenderness 05/16/2014  . RLQ abdominal tenderness 05/16/2014  . TIA (transient ischemic attack) 01/27/2015  . TRIGGER FINGER 02/11/2009   Qualifier: Diagnosis of  By: Aline Brochure MD, Dorothyann Peng    . TRIGGER FINGER DEFORMITY 06/25/2010   Qualifier: Diagnosis of  By: Aline Brochure MD, Dorothyann Peng      Patient Active Problem List   Diagnosis Date Noted  . Nausea and vomiting 06/13/2020  . Pneumonia due to COVID-19 virus 06/08/2020  .  Annual visit for general adult medical examination with abnormal findings 03/19/2020  . Arthritis 12/20/2019  . Forgetfulness 12/06/2019  . Hyperlipidemia LDL goal <70 01/29/2015  . Acquired autoimmune hypothyroidism 05/16/2013  . HTN (hypertension) 04/22/2009  . GERD (gastroesophageal reflux disease) 04/22/2009  . Fibromyalgia 02/11/2009    Past Surgical History:  Procedure Laterality Date  . ABDOMINAL HYSTERECTOMY     tumor removal; benign  . BLADDER SURGERY    . CHOLECYSTECTOMY    . COLONOSCOPY  05/22/2003   YBO:FBPZWC rectum/ Left-sided diverticula.  The remainder  of the colonic mucosa and terminal  ileum appeared normal  . COLONOSCOPY  10/05/2012   HEN:IDPOEUM diverticulosis. Tubular adenomas   . COLONOSCOPY N/A 12/08/2017   Procedure: COLONOSCOPY;  Surgeon: Daneil Dolin, MD;  Location: [facility];  Service: Endoscopy;  Laterality: N/A;  8:30  . ESOPHAGOGASTRODUODENOSCOPY  05/22/2003   RMR: Tiny distal esophageal erosions consistent with mild erosive reflux esophagitisOtherwise, normal upper gastrointestinal tract through the second part of   the duodenum  . ESOPHAGOGASTRODUODENOSCOPY  07/22/1992   RMR: Normal EGD  . ROBOTIC ASSITED PARTIAL NEPHRECTOMY     left. States was cancerous.   Marland Kitchen TOTAL HIP ARTHROPLASTY       OB History    Gravida  11   Para  7   Term      Preterm      AB  4   Living  7     SAB  4   TAB      Ectopic      Multiple      Live Births              Family History  Problem Relation Age of Onset  . Diabetes Father   . Liver cancer Mother   . Lung cancer Mother   . Diabetes Sister   . Other Son        backpain  . Other Son        staph in jugular vein  . Stroke Son   . Other Son        moya moya  . Hypertension Other   . Cancer Other        lung  . Cancer Other        cancer  . Colon cancer Neg Hx     Social History   Tobacco Use  . Smoking status: Former Smoker    Types: Cigarettes  . Smokeless tobacco: Former Systems developer    Types: Snuff  Vaping Use  . Vaping Use: Never used  Substance Use Topics  . Alcohol use: Yes    Comment: wine every now and then  . Drug use: No    Home Medications Prior to Admission medications   Medication Sig Start Date End Date Taking? Authorizing Provider  albuterol (VENTOLIN HFA) 108 (90 Base) MCG/ACT inhaler Inhale 1-2 puffs into the lungs every 6 (six) hours as needed for wheezing or shortness of breath. 06/09/20  Yes Shah, Pratik D, DO  amLODipine (NORVASC) 10 MG tablet Take 1 tablet (10 mg total) by mouth daily. 03/19/20  Yes Perlie Mayo, NP    Artificial Tear Solution (TEARS AGAIN OP) Place 1 drop into both eyes daily as needed (for dry eye relief).    Yes [provider]  aspirin 81 MG chewable tablet Chew 81 mg by mouth in the morning.    Yes [provider]  Cholecalciferol (VITAMIN D3) 2000 UNITS TABS Take 1 tablet by  mouth daily.   Yes [provider]  cloNIDine (CATAPRES) 0.1 MG tablet Take 1 tablet (0.1 mg total) by mouth at bedtime. 03/19/20  Yes Perlie Mayo, NP  dexamethasone (DECADRON) 6 MG tablet Take 1 tablet (6 mg total) by mouth daily for 8 days. 06/09/20 06/17/20 Yes Shah, Pratik D, DO  gabapentin (NEURONTIN) 300 MG capsule Take 1 capsule (300 mg total) by mouth at bedtime. 01/24/20  Yes Fayrene Helper, MD  guaiFENesin-dextromethorphan (ROBITUSSIN DM) 100-10 MG/5ML syrup Take 5 mLs by mouth every 4 (four) hours as needed for cough. 06/09/20  Yes Shah, Pratik D, DO  levothyroxine (SYNTHROID, LEVOTHROID) 100 MCG tablet Take 100 mcg by mouth daily before breakfast.    Yes [provider]  metoprolol succinate (TOPROL-XL) 100 MG 24 hr tablet Take 1 tablet (100 mg total) by mouth daily. Take with or immediately following a meal. 03/19/20  Yes Perlie Mayo, NP  omeprazole (PRILOSEC) 20 MG capsule Take 1 capsule (20 mg total) by mouth 2 (two) times daily. 03/19/20  Yes Perlie Mayo, NP  rosuvastatin (CRESTOR) 10 MG tablet Take 1 tablet (10 mg total) by mouth daily. 03/19/20  Yes Perlie Mayo, NP  sodium chloride (OCEAN) 0.65 % SOLN nasal spray Place 1 spray into both nostrils as needed for congestion.   Yes [provider]  sucralfate (CARAFATE) 1 g tablet Take 1 tablet (1 g total) by mouth 4 (four) times daily. Patient taking differently: Take 1 g by mouth daily.  03/19/20  Yes Perlie Mayo, NP  torsemide (DEMADEX) 20 MG tablet Take 1 tablet (20 mg total) by mouth daily. Take 20 mg by mouth daily. Patient taking differently: Take 20 mg by mouth daily.  03/19/20  Yes Perlie Mayo, NP  ferrous sulfate 325 (65 FE) MG EC tablet Take 325 mg by mouth daily.    [provider]    Allergies    Codeine and Other  Review of Systems   Review of Systems  Unable to perform ROS: Mental status change  Gastrointestinal: Positive for vomiting.  Neurological: Positive for weakness.    Physical Exam Updated Vital Signs BP (!) 162/76   Pulse 71   Temp 99.9 F (37.7 C) (Oral)   Resp 10   Ht 5\' 4"  (1.626 m)   Wt 59 kg   SpO2 95%   BMI 22.31 kg/m   Physical Exam Vitals and nursing note reviewed.  Constitutional:      Appearance: She is normal weight. She is not ill-appearing or toxic-appearing.  HENT:     Head: Normocephalic and atraumatic.     Mouth/Throat:     Mouth: Mucous membranes are dry.  Cardiovascular:     Rate and Rhythm: Normal rate and regular rhythm.     Heart sounds: Normal heart sounds.  Pulmonary:     Effort: Pulmonary effort is normal.     Breath sounds: Normal breath sounds.  Abdominal:     Palpations: Abdomen is soft.     Tenderness: There is no abdominal tenderness.  Skin:    General: Skin is warm and dry.  Neurological:     Mental Status: She is disoriented.     Comments: No unilateral weakness, follows simple commands     ED Results / Procedures / Treatments   Labs (all labs ordered are listed, but only abnormal results are displayed) Labs Reviewed  CBC WITH DIFFERENTIAL/PLATELET - Abnormal; Notable for the following components:  Result Value   Platelets 513 (*)    Abs Immature Granulocytes 0.09 (*)    All other components within normal limits  COMPREHENSIVE METABOLIC PANEL - Abnormal; Notable for the following components:   Potassium 2.9 (*)    Chloride 91 (*)    Glucose, Bld 154 (*)    Creatinine, Ser 1.01 (*)    ALT 45 (*)    GFR calc non Af Amer 51 (*)    GFR calc Af Amer 59 (*)    All other components within normal limits  AMMONIA  LACTIC ACID, PLASMA  URINALYSIS, ROUTINE W REFLEX MICROSCOPIC    CBG MONITORING, ED    EKG EKG Interpretation  Date/Time:  Thursday June 13 2020 19:45:47 EDT Ventricular Rate:  78 PR Interval:    QRS Duration: 104 QT Interval:  406 QTC Calculation: 463 R Axis:   67 Text Interpretation: Sinus rhythm Right atrial enlargement Probable anterior infarct, old Nonspecific repol abnormality, diffuse leads No significant change since last tracing Confirmed by Calvert Cantor [phone]) on 06/13/2020 8:12:40 PM   Radiology CT Head Wo Contrast  Result Date: 06/13/2020 CLINICAL DATA:  Worsening fatigue with mental status change EXAM: CT HEAD WITHOUT CONTRAST TECHNIQUE: Contiguous axial images were obtained from the base of the skull through the vertex without intravenous contrast. COMPARISON:  MRI 01/28/2015 CT 01/27/2015 FINDINGS: Brain: No acute territorial infarction, hemorrhage, or intracranial mass. Mild atrophy. Patchy hypodensity in the white matter consistent with chronic small vessel ischemic change. Nonenlarged ventricles. Vascular: No hyperdense vessels.  Carotid vascular calcification Skull: Normal. Negative for fracture or focal lesion. Sinuses/Orbits: No acute finding. Other: None IMPRESSION: 1. No CT evidence for acute intracranial abnormality. 2. Atrophy and chronic small vessel ischemic changes of the white matter. Electronically Signed   By: Donavan Foil M.D.   On: 06/13/2020 20:15   DG Chest Port 1 View  Result Date: 06/13/2020 CLINICAL DATA:  Weakness with recent COVID positive diagnosis. EXAM: PORTABLE CHEST 1 VIEW COMPARISON:  June 08, 2020 FINDINGS: There is no evidence of acute infiltrate, pleural effusion or pneumothorax. Mild, stable elevation of the right hemidiaphragm is seen. The heart size and mediastinal contours are within normal limits. Radiopaque surgical clips are seen overlying the right upper quadrant. The visualized skeletal structures are unremarkable. IMPRESSION: No active disease. Electronically Signed   By: Virgina Norfolk  M.D.   On: 06/13/2020 19:30    Procedures Procedures (including critical care time)  Medications Ordered in ED Medications  acetaminophen (TYLENOL) tablet 650 mg (650 mg Oral Given 06/13/20 1922)    ED Course  I have reviewed the triage vital signs and the nursing notes.  Pertinent labs & imaging results that were available during my care of the patient were reviewed by me and considered in my medical decision making (see chart for details).  Clinical Course as of Jun 13 2105  Thu Jun 13, 2020  1962 Per daughter, Maudie Mercury, patient has COVID, was discharged home, is vomiting and weak. Lives alone.   Per Zigmund Daniel, daughter, vomiting for the past 2 days. Patient did go to all 3 days of infusions, staying home alone, daughter lives across the street. Not eating due to vomiting and loss of taste. Has been confused for the past 3-4 days.   [LM]  3150 86 year old female brought in by family for generalized weakness, vomiting, confusion. Patient was discharged on 06/09/20 after a 1 day admission for COVID, followed up outpatient with prescribed course of infusions.  On  exam, patient is generally weak, confused, keeps repeating, "they took me back to COVID." Vitals are reassuring, labs with mild hypokalemia and replaced orally. CXR unremarkable. CT head for altered mental status, unremarkable. Ammonia WNL. UA pending collection. Discussed with Dr. Karle Starch, Er attending, plan is to admit for weakness/vomiting.    [LM]    Clinical Course User Index [LM] Roque Lias   MDM Rules/Calculators/A&P                          Final Clinical Impression(s) / ED Diagnoses Final diagnoses:  COVID-19  Weakness  Hypokalemia  Altered mental status, unspecified altered mental status type    Rx / DC Orders ED Discharge Orders    None       Roque Lias 06/13/20 2106    Truddie Hidden, MD 06/13/20 2215

## 2020-06-13 NOTE — ED Triage Notes (Signed)
States she was diagnosed with covid today

## 2020-06-13 NOTE — H&P (Signed)
History and Physical    Leonard Hendler EHM:[id number] DOB: November 11, 1932 DOA: 06/13/2020  PCP: Perlie Mayo, NP   Patient coming from: Home.  I have personally briefly reviewed patient's old medical records in [place]  Chief Complaint: Nausea and vomiting.  AMS.  HPI: Brenda Erickson is a 86 y.o. female with medical history significant of acquired trigger finger, osteoarthritis, mice the left kidney, constipation, diverticulosis, esophageal reflux, H. pylori gastritis, fibromyalgia, goiter, Hashimoto's disease, acquired hypothyroidism, hyperlipidemia, hypertension, urolithiasis, TIA who lives alone, although her daughter lives across the street, who is brought to the emergency department due to 3 to 4 days of nausea and emesis and confusion. She is unable to provide further information, but is able to answer simple questions. She denies headache, chest pain, abdominal or back pain at this time.   ED Course: Initial vital signs were temperature 100.3 F, pulse 77, respiration 19, blood pressure 141/84 mmHg O2 sat 97% on room air. The patient received 650 mg of Tylenol and KCl 10 mEq IVPB x2.  CBC showed a white count of 9.1, hemoglobin 14.8 g/dL and platelets 513. CMP shows a potassium of 2.9 chloride 91 mmol/L. All other electrolytes are within normal range. Glucose 154, creatinine 1.01 mg/dL. ALT is slightly elevated at 45 units/L, but all other hepatic function tests are within expected range. Lactic acid, ammonia, phosphorus and magnesium were normal.  Imaging: A 1 view chest radiograph did not find any active disease. Heart size and mediastinal contours were within normal limits. CT head without contrast showed atrophy and chronic small vessel ischemic changes, but no acute intracranial normality. Please see images and full radiology report for further detail.  Review of Systems: As per HPI otherwise all other systems reviewed and are negative.  Past Medical History:  Diagnosis  Date  . Acquired trigger finger 05/25/2012  . Arthritis   . Cancer ([place])    mass on left kidney  . Change in bowel habits 09/21/2012  . CONSTIPATION 04/22/2009   Qualifier: Diagnosis of  By: Christ Kick    . Diverticulosis   . DIVERTICULOSIS, COLON 04/22/2009   Qualifier: Diagnosis of  By: Christ Kick    . EPIGASTRIC PAIN 04/22/2009   Qualifier: Diagnosis of  By: Christ Kick    . Esophageal reflux   . Fibromyalgia   . GOITER 04/22/2009   Qualifier: History of  By: Christ Kick    . Hashimoto's disease   . Hashimoto's disease   . Helicobacter pylori gastritis    in remote past, s/p treatment   . HELICOBACTER PYLORI GASTRITIS, HX OF 04/22/2009   Qualifier: Diagnosis of  By: Christ Kick    . HIP PAIN, RIGHT 04/22/2009   Qualifier: Diagnosis of  By: Christ Kick    . History of gastroesophageal reflux (GERD) 05/16/2013  . HTN (hypertension)   . Hyperlipidemia LDL goal <70 01/29/2015  . Hypothyroid   . Hypothyroidism 04/22/2009   Qualifier: Diagnosis of  By: Christ Kick    . Kidney stones   . Low back pain radiating to both legs 05/16/2013  . Myalgia and myositis 04/22/2009   Qualifier: Diagnosis of  By: Christ Kick    . Renal mass, left 05/02/2013  . RLQ abdominal tenderness 05/16/2014  . RLQ abdominal tenderness 05/16/2014  . TIA (transient ischemic attack) 01/27/2015  . TRIGGER FINGER 02/11/2009   Qualifier: Diagnosis of  By: Aline Brochure MD, Dorothyann Peng    . TRIGGER FINGER DEFORMITY 06/25/2010   Qualifier: Diagnosis of  By:  Aline Brochure MD, Dorothyann Peng     Past Surgical History:  Procedure Laterality Date  . ABDOMINAL HYSTERECTOMY     tumor removal; benign  . BLADDER SURGERY    . CHOLECYSTECTOMY    . COLONOSCOPY  05/22/2003   EYC:XKGYJE rectum/ Left-sided diverticula.  The remainder of the colonic mucosa and terminal  ileum appeared normal  . COLONOSCOPY  10/05/2012   HUD:JSHFWYO diverticulosis. Tubular adenomas   . COLONOSCOPY N/A 12/08/2017   Procedure:  COLONOSCOPY;  Surgeon: Daneil Dolin, MD;  Location: [facility];  Service: Endoscopy;  Laterality: N/A;  8:30  . ESOPHAGOGASTRODUODENOSCOPY  05/22/2003   RMR: Tiny distal esophageal erosions consistent with mild erosive reflux esophagitisOtherwise, normal upper gastrointestinal tract through the second part of   the duodenum  . ESOPHAGOGASTRODUODENOSCOPY  07/22/1992   RMR: Normal EGD  . ROBOTIC ASSITED PARTIAL NEPHRECTOMY     left. States was cancerous.   Marland Kitchen TOTAL HIP ARTHROPLASTY     Social History  reports that she has quit smoking. Her smoking use included cigarettes. She has quit using smokeless tobacco.  Her smokeless tobacco use included snuff. She reports current alcohol use. She reports that she does not use drugs.  Allergies  Allergen Reactions  . Codeine Nausea And Vomiting  . Other     NO BLOOD PRODUCTS   Family History  Problem Relation Age of Onset  . Diabetes Father   . Liver cancer Mother   . Lung cancer Mother   . Diabetes Sister   . Other Son        backpain  . Other Son        staph in jugular vein  . Stroke Son   . Other Son        moya moya  . Hypertension Other   . Cancer Other        lung  . Cancer Other        cancer  . Colon cancer Neg Hx    Prior to Admission medications   Medication Sig Start Date End Date Taking? Authorizing Provider  albuterol (VENTOLIN HFA) 108 (90 Base) MCG/ACT inhaler Inhale 1-2 puffs into the lungs every 6 (six) hours as needed for wheezing or shortness of breath. 06/09/20  Yes Shah, Pratik D, DO  amLODipine (NORVASC) 10 MG tablet Take 1 tablet (10 mg total) by mouth daily. 03/19/20  Yes Perlie Mayo, NP  Artificial Tear Solution (TEARS AGAIN OP) Place 1 drop into both eyes daily as needed (for dry eye relief).    Yes [provider]  aspirin 81 MG chewable tablet Chew 81 mg by mouth in the morning.    Yes [provider]  Cholecalciferol (VITAMIN D3) 2000 UNITS TABS Take 1 tablet by mouth daily.   Yes  [provider]  cloNIDine (CATAPRES) 0.1 MG tablet Take 1 tablet (0.1 mg total) by mouth at bedtime. 03/19/20  Yes Perlie Mayo, NP  dexamethasone (DECADRON) 6 MG tablet Take 1 tablet (6 mg total) by mouth daily for 8 days. 06/09/20 06/17/20 Yes Shah, Pratik D, DO  gabapentin (NEURONTIN) 300 MG capsule Take 1 capsule (300 mg total) by mouth at bedtime. 01/24/20  Yes Fayrene Helper, MD  guaiFENesin-dextromethorphan (ROBITUSSIN DM) 100-10 MG/5ML syrup Take 5 mLs by mouth every 4 (four) hours as needed for cough. 06/09/20  Yes Shah, Pratik D, DO  levothyroxine (SYNTHROID, LEVOTHROID) 100 MCG tablet Take 100 mcg by mouth daily before breakfast.    Yes [provider]  metoprolol succinate (TOPROL-XL) 100 MG 24 hr tablet Take 1 tablet (100 mg total) by mouth daily. Take with or immediately following a meal. 03/19/20  Yes Perlie Mayo, NP  omeprazole (PRILOSEC) 20 MG capsule Take 1 capsule (20 mg total) by mouth 2 (two) times daily. 03/19/20  Yes Perlie Mayo, NP  rosuvastatin (CRESTOR) 10 MG tablet Take 1 tablet (10 mg total) by mouth daily. 03/19/20  Yes Perlie Mayo, NP  sodium chloride (OCEAN) 0.65 % SOLN nasal spray Place 1 spray into both nostrils as needed for congestion.   Yes [provider]  sucralfate (CARAFATE) 1 g tablet Take 1 tablet (1 g total) by mouth 4 (four) times daily. Patient taking differently: Take 1 g by mouth daily.  03/19/20  Yes Perlie Mayo, NP  torsemide (DEMADEX) 20 MG tablet Take 1 tablet (20 mg total) by mouth daily. Take 20 mg by mouth daily. Patient taking differently: Take 20 mg by mouth daily.  03/19/20  Yes Perlie Mayo, NP  ferrous sulfate 325 (65 FE) MG EC tablet Take 325 mg by mouth daily.    [provider]   Physical Exam: Vitals:   06/13/20 2030 06/13/20 2151 06/13/20 2200 06/13/20 2230  BP: (!) 162/76 (!) 145/77 (!) 157/67 (!) 162/84  Pulse: 71 75 70 73  Resp: 10 13 10 12   Temp:      TempSrc:      SpO2: 95%  94% 92% 94%  Weight:      Height:       Constitutional: Frail, elderly female. AD, calm, comfortable Eyes: PERRL, lids and conjunctivae normal ENMT: Mucous membranes are moist. Posterior pharynx clear of any exudate or lesions. Neck: normal, supple, no masses, no thyromegaly Respiratory: clear to auscultation bilaterally, no wheezing, no crackles. Normal respiratory effort. No accessory muscle use.  Cardiovascular: Regular rate and rhythm, no murmurs / rubs / gallops. No extremity edema. 2+ pedal pulses. No carotid bruits.  Abdomen: Nondistended. BS positive. Soft, no tenderness, no masses palpated. No hepatosplenomegaly.  Musculoskeletal: no clubbing / cyanosis. Good ROM, no contractures. Normal muscle tone.  Skin: no rashes, lesions, ulcers on limited dermatological examination. Neurologic: CN 2-12 grossly intact. Sensation intact, DTR normal. Strength 5/5 in all 4.  Psychiatric: Normal judgment and insight. Alert and oriented x 3. Normal mood.   Labs on Admission: I have personally reviewed following labs and imaging studies  CBC: Recent Labs  Lab 06/08/20 1243 06/09/20 0722 06/13/20 1418 06/13/20 1915  WBC 5.5 3.3* 9.4 9.1  NEUTROABS  --  2.3  --  6.7  HGB 13.7 13.6 14.6 14.8  HCT 42.0 41.5 45.0 45.6  MCV 91.1 90.2 89.6 91.6  PLT 243 262 541* 513*    Basic Metabolic Panel: Recent Labs  Lab 06/08/20 1243 06/09/20 0722 06/13/20 1418 06/13/20 1915  NA 132* 136 139 137  K 3.6 3.2* 2.9* 2.9*  CL 84* 88* 92* 91*  CO2 34* 34* 32 32  GLUCOSE 130* 179* 154* 154*  BUN 18 18 10 18   CREATININE 1.02* 0.93 0.93 1.01*  CALCIUM 9.3 8.9 9.6 9.4  MG  --   --   --  2.0  PHOS  --   --   --  3.7   GFR: Estimated Creatinine Clearance: 35.2 mL/min (A) (by C-G formula based on SCr of 1.01 mg/dL (H)).  Liver Function Tests: Recent Labs  Lab 06/08/20 1243 06/09/20 0722 06/13/20 1915  AST 45* 32 34  ALT  35 30 45*  ALKPHOS 56 51 60  BILITOT 0.5 0.5 1.0  PROT 7.0 6.5 7.1    ALBUMIN 3.6 3.1* 3.8   Radiological Exams on Admission: CT Head Wo Contrast  Result Date: 06/13/2020 CLINICAL DATA:  Worsening fatigue with mental status change EXAM: CT HEAD WITHOUT CONTRAST TECHNIQUE: Contiguous axial images were obtained from the base of the skull through the vertex without intravenous contrast. COMPARISON:  MRI 01/28/2015 CT 01/27/2015 FINDINGS: Brain: No acute territorial infarction, hemorrhage, or intracranial mass. Mild atrophy. Patchy hypodensity in the white matter consistent with chronic small vessel ischemic change. Nonenlarged ventricles. Vascular: No hyperdense vessels.  Carotid vascular calcification Skull: Normal. Negative for fracture or focal lesion. Sinuses/Orbits: No acute finding. Other: None IMPRESSION: 1. No CT evidence for acute intracranial abnormality. 2. Atrophy and chronic small vessel ischemic changes of the white matter. Electronically Signed   By: Donavan Foil M.D.   On: 06/13/2020 20:15   DG Chest Port 1 View  Result Date: 06/13/2020 CLINICAL DATA:  Weakness with recent COVID positive diagnosis. EXAM: PORTABLE CHEST 1 VIEW COMPARISON:  June 08, 2020 FINDINGS: There is no evidence of acute infiltrate, pleural effusion or pneumothorax. Mild, stable elevation of the right hemidiaphragm is seen. The heart size and mediastinal contours are within normal limits. Radiopaque surgical clips are seen overlying the right upper quadrant. The visualized skeletal structures are unremarkable. IMPRESSION: No active disease. Electronically Signed   By: Virgina Norfolk M.D.   On: 06/13/2020 19:30   EKG: Independently reviewed.  Vent. rate 78 BPM PR interval * ms QRS duration 104 ms QT/QTc 406/463 ms P-R-T axes 90 67 * Sinus rhythm Right atrial enlargement Probable anterior infarct, old Nonspecific repol abnormality, diffuse leads  Assessment/Plan Principal Problem:   Nausea and vomiting Observation/telemetry. Keep n.p.o. overnight. Continue time-limited IV  hydration. Antiemetics as needed. Trial of clear liquids in a.m. Consult TOC team.  Active Problems:   Hypokalemia Replacing. Follow-up potassium level.    HTN (hypertension) Hold antihypertensives due to soft BP.    Hyperlipidemia LDL goal <70 Continue Crestor 10 mg p.o. daily.    Acquired autoimmune hypothyroidism Continue Synthroid 100 mcg p.o. daily.    Thrombocytosis (HCC) Monitor platelet count.    ALT (SGPT) level raised Minimally raised. Follow-up on morning labs.    Recent COVID disease Oxygenating well. Continue isolation protocols.    DVT prophylaxis: SCDs. Code Status:   Full code. Family Communication: Disposition Plan:   Patient is from:  Home.  Anticipated DC to:  Home.  Anticipated DC date:  0808 2021.  Anticipated DC barriers: Clinical status.  Consults called: Admission status:  Observation/telemetry.  Severity of Illness:  Reubin Milan MD [facility]  How to contact the [facility] Attending or Consulting provider [place] or covering provider during after hours [place], for this patient?   1. Check the care team in [facility] and look for a) attending/consulting [facility] provider listed and b) the [facility] team listed 2. Log into [email or web address] and use [place]'s universal password to access. If you do not have the password, please contact the hospital operator. 3. Locate the [facility] provider you are looking for under [facility] and page to a number that you can be directly reached. 4. If you still have difficulty reaching the provider, please page the [facility] (Director on Call) for the Hospitalists listed on amion for assistance.  06/13/2020, 11:01 PM   This document was prepared using Dragon voice recognition software and may contain  some unintended transcription errors.

## 2020-06-13 NOTE — ED Notes (Signed)
Patient is leaving with family, stated she was going to [facility]

## 2020-06-13 NOTE — ED Triage Notes (Signed)
Pt arrives POV for eval of weakness. Pt was recently dx'd w/ covid on 7/31, daughter reports progressively worsening since that time. Pt appears lethargic and fatigued in triage, denies acute SOB or CP.

## 2020-06-13 NOTE — Progress Notes (Signed)
      This encounter was created in error - please disregard. Patient never answered call for provider

## 2020-06-13 NOTE — ED Notes (Signed)
Pt daughter concerned about pt being confused and running a fever at home. Pt recently admitted with COVID and discharged home.

## 2020-06-14 DIAGNOSIS — E785 Hyperlipidemia, unspecified: Secondary | ICD-10-CM

## 2020-06-14 DIAGNOSIS — E063 Autoimmune thyroiditis: Secondary | ICD-10-CM

## 2020-06-14 DIAGNOSIS — I10 Essential (primary) hypertension: Secondary | ICD-10-CM

## 2020-06-14 DIAGNOSIS — U071 COVID-19: Principal | ICD-10-CM

## 2020-06-14 DIAGNOSIS — E876 Hypokalemia: Secondary | ICD-10-CM

## 2020-06-14 DIAGNOSIS — R112 Nausea with vomiting, unspecified: Secondary | ICD-10-CM | POA: Diagnosis not present

## 2020-06-14 LAB — COMPREHENSIVE METABOLIC PANEL
ALT: 39 U/L (ref 0–44)
AST: 30 U/L (ref 15–41)
Albumin: 3.4 g/dL — ABNORMAL LOW (ref 3.5–5.0)
Alkaline Phosphatase: 55 U/L (ref 38–126)
Anion gap: 13 (ref 5–15)
BUN: 14 mg/dL (ref 8–23)
CO2: 26 mmol/L (ref 22–32)
Calcium: 8.8 mg/dL — ABNORMAL LOW (ref 8.9–10.3)
Chloride: 97 mmol/L — ABNORMAL LOW (ref 98–111)
Creatinine, Ser: 0.75 mg/dL (ref 0.44–1.00)
GFR calc Af Amer: >60 mL/min (ref >60)
GFR calc non Af Amer: >60 mL/min (ref >60)
Glucose, Bld: 126 mg/dL — ABNORMAL HIGH (ref 70–99)
Potassium: 3.3 mmol/L — ABNORMAL LOW (ref 3.5–5.1)
Sodium: 136 mmol/L (ref 135–145)
Total Bilirubin: 0.8 mg/dL (ref 0.3–1.2)
Total Protein: 6.4 g/dL — ABNORMAL LOW (ref 6.5–8.1)

## 2020-06-14 LAB — URINALYSIS, ROUTINE W REFLEX MICROSCOPIC
Bilirubin Urine: NEGATIVE
Glucose, UA: NEGATIVE mg/dL
Ketones, ur: 20 mg/dL — AB
Nitrite: NEGATIVE
Protein, ur: 30 mg/dL — AB
Specific Gravity, Urine: 1.013 (ref 1.005–1.030)
WBC, UA: >50 WBC/hpf — ABNORMAL HIGH (ref 0–5)
pH: 7 (ref 5.0–8.0)

## 2020-06-14 LAB — CBC
HCT: 47.4 % — ABNORMAL HIGH (ref 36.0–46.0)
Hemoglobin: 15.1 g/dL — ABNORMAL HIGH (ref 12.0–15.0)
MCH: 29.8 pg (ref 26.0–34.0)
MCHC: 31.9 g/dL (ref 30.0–36.0)
MCV: 93.5 fL (ref 80.0–100.0)
Platelets: 477 10*3/uL — ABNORMAL HIGH (ref 150–400)
RBC: 5.07 MIL/uL (ref 3.87–5.11)
RDW: 12.7 % (ref 11.5–15.5)
WBC: 9.9 10*3/uL (ref 4.0–10.5)
nRBC: 0 % (ref 0.0–0.2)

## 2020-06-14 MED ORDER — POTASSIUM CHLORIDE CRYS ER 20 MEQ PO TBCR
20.0000 meq | EXTENDED_RELEASE_TABLET | Freq: Three times a day (TID) | ORAL | Status: DC
  Filled 2020-06-14: qty 1

## 2020-06-14 MED ORDER — PANTOPRAZOLE SODIUM 40 MG PO TBEC
40.0000 mg | DELAYED_RELEASE_TABLET | Freq: Two times a day (BID) | ORAL | Status: DC
  Administered 2020-06-14 – 2020-06-17 (×6): 40 mg via ORAL
  Filled 2020-06-14 (×6): qty 1

## 2020-06-14 MED ORDER — POTASSIUM CHLORIDE CRYS ER 20 MEQ PO TBCR: 40.0000 meq | EXTENDED_RELEASE_TABLET | Freq: Once | ORAL | Status: DC

## 2020-06-14 MED ORDER — POLYVINYL ALCOHOL 1.4 % OP SOLN
1.0000 [drp] | Freq: Every day | OPHTHALMIC | Status: DC | PRN
  Filled 2020-06-14: qty 15

## 2020-06-14 MED ORDER — METOPROLOL SUCCINATE ER 50 MG PO TB24
100.0000 mg | ORAL_TABLET | Freq: Every day | ORAL | Status: DC
  Administered 2020-06-14 – 2020-06-17 (×4): 100 mg via ORAL
  Filled 2020-06-14 (×3): qty 2
  Filled 2020-06-14: qty 4

## 2020-06-14 MED ORDER — POTASSIUM CHLORIDE IN NACL 40-0.9 MEQ/L-% IV SOLN
INTRAVENOUS | Status: AC
  Filled 2020-06-14 (×2): qty 1000

## 2020-06-14 MED ORDER — ROSUVASTATIN CALCIUM 10 MG PO TABS
10.0000 mg | ORAL_TABLET | Freq: Every day | ORAL | Status: DC
  Administered 2020-06-15 – 2020-06-17 (×3): 10 mg via ORAL
  Filled 2020-06-14 (×7): qty 1

## 2020-06-14 MED ORDER — CLONIDINE HCL 0.1 MG PO TABS
0.1000 mg | ORAL_TABLET | Freq: Every day | ORAL | Status: DC
  Administered 2020-06-14 – 2020-06-16 (×3): 0.1 mg via ORAL
  Filled 2020-06-14 (×3): qty 1

## 2020-06-14 MED ORDER — LEVOTHYROXINE SODIUM 100 MCG PO TABS
100.0000 ug | ORAL_TABLET | Freq: Every day | ORAL | Status: DC
  Administered 2020-06-15 – 2020-06-17 (×3): 100 ug via ORAL
  Filled 2020-06-14 (×3): qty 1

## 2020-06-14 MED ORDER — GUAIFENESIN-DM 100-10 MG/5ML PO SYRP: 5.0000 mL | ORAL_SOLUTION | ORAL | Status: DC | PRN

## 2020-06-14 MED ORDER — ALBUTEROL SULFATE HFA 108 (90 BASE) MCG/ACT IN AERS: 1.0000 | INHALATION_SPRAY | Freq: Four times a day (QID) | RESPIRATORY_TRACT | Status: DC | PRN

## 2020-06-14 MED ORDER — DEXAMETHASONE 4 MG PO TABS
6.0000 mg | ORAL_TABLET | ORAL | Status: AC
  Administered 2020-06-14 – 2020-06-16 (×3): 6 mg via ORAL
  Filled 2020-06-14 (×3): qty 2

## 2020-06-14 NOTE — ED Notes (Signed)
Entered room to check on pt. She was noted sitting on stool beside bed and had used the trash can to urinate in. Writer explained the importance of calling for help at this time before getting up due to fall risk. Reoriented pt to call light and educated about use of pure wick. Pt cleaned up and pure wick re-placed. Attempted to place brief on pt in case pure-wick failed, but she refused. Bedside table cleaned up. Writer checked for [facility], but all are in use at this time. Reminded pt to call for assistance prior to getting up. Bed in lowest position, call light within reach.

## 2020-06-14 NOTE — Progress Notes (Signed)
PROGRESS NOTE    Brenda Erickson  PXT:[id number] DOB: 04-Jan-1933 DOA: 06/13/2020 PCP: Perlie Mayo, NP    Chief Complaint  Patient presents with  . Abdominal Pain  . Fever    Brief Narrative:  As per H&P written by Dr. Olevia Bowens on 06/13/20 86 y.o. female with medical history significant of acquired trigger finger, osteoarthritis, mice the left kidney, constipation, diverticulosis, esophageal reflux, H. pylori gastritis, fibromyalgia, goiter, Hashimoto's disease, acquired hypothyroidism, hyperlipidemia, hypertension, urolithiasis, TIA who lives alone, although her daughter lives across the street, who is brought to the emergency department due to 3 to 4 days of nausea and emesis and confusion. She is unable to provide further information, but is able to answer simple questions. She denies headache, chest pain, abdominal or back pain at this time.   ED Course: Initial vital signs were temperature 100.3 F, pulse 77, respiration 19, blood pressure 141/84 mmHg O2 sat 97% on room air. The patient received 650 mg of Tylenol and KCl 10 mEq IVPB x2.  CBC showed a white count of 9.1, hemoglobin 14.8 g/dL and platelets 513. CMP shows a potassium of 2.9 chloride 91 mmol/L. All other electrolytes are within normal range. Glucose 154, creatinine 1.01 mg/dL. ALT is slightly elevated at 45 units/L, but all other hepatic function tests are within expected range. Lactic acid, ammonia, phosphorus and magnesium were normal.  Assessment & Plan: 1-nausea and vomiting -Still ongoing; intermittently may be slightly improved. -Continue slowly advancing diet -Continue as needed antiemetics -Continue the use of PPI and supportive care. -Most likely secondary to underlying history of Covid infection along with gastritis.  2-HTN (hypertension) -Stable and rising -Continue slowly resuming antihypertensive medication, holding diuretics. -Follow vital signs.  3-Hyperlipidemia LDL goal <70 -Continue  Crestor  4-Acquired autoimmune hypothyroidism -Continue Synthroid  5-Hypokalemia -Continue repletion and follow trend -Check magnesium level  6-recent Covid infection -Patient completed remdesivir infusions and is in the process of completing Decadron. -No requiring oxygen supplementation and expressed no shortness of breath -Continue supportive care and as needed bronchodilators. -continue isolation protocols.   DVT prophylaxis: SCDs Code Status: Full code Family Communication: No family at bedside. Disposition:   Status is: Observation  Dispo: The patient is from: home              Anticipated d/c is to: home              Anticipated d/c date is: to be determined.               Patient currently no medically stable; still with ongoing intermittent nausea/vomiting and difficulty eating.  Continue electrolyte repletion, IV fluids resuscitation and the use of PPI.  We will continue slowly advancing diet.       Consultants:   None   Procedures:  See below for x-ray reports.   Antimicrobials:  None   Subjective: Reporting midepigastric pain and ongoing nausea/vomiting intermittently.  Patient is afebrile.  Good oxygen saturation on room air.  Objective: Vitals:   06/14/20 0400 06/14/20 0530 06/14/20 0700 06/14/20 1322  BP: (!) 162/74 (!) 103/92 (!) 158/85 135/72  Pulse: 72 93 84 83  Resp: 14 (!) 26 17 19   Temp:      TempSrc:      SpO2: 94% 99% 97% 96%  Weight:      Height:        Intake/Output Summary (Last 24 hours) at 06/14/2020 1755 Last data filed at 06/13/2020 2347 Gross per 24 hour  Intake 1084.55 ml  Output --  Net 1084.55 ml   Filed Weights   06/13/20 1823  Weight: 59 kg    Examination:  General exam: In mild distress secondary to abdominal discomfort (mid epigastric region) and ongoing intermittent nausea/vomiting.  No chest pain, no hematemesis.  No dysuria, no hematuria, no melena or hematochezia. Respiratory system: Clear to  auscultation. Respiratory effort normal.  Oxygen saturation on room air. Cardiovascular system: S1 & S2 heard, RRR. No JVD, murmurs, rubs, gallops or clicks. No pedal edema. Gastrointestinal system: Abdomen is nondistended, soft and mildly tender to palpation in midepigastric area. No organomegaly or masses felt. Normal bowel sounds heard. Central nervous system: Alert and oriented. No focal neurological deficits. Extremities: No cyanosis or clubbing. Skin: No rashes, no petechiae. Psychiatry: Mood & affect appropriate.    Data Reviewed: I have personally reviewed following labs and imaging studies  CBC: Recent Labs  Lab 06/08/20 1243 06/09/20 0722 06/13/20 1418 06/13/20 1915 06/14/20 0545  WBC 5.5 3.3* 9.4 9.1 9.9  NEUTROABS  --  2.3  --  6.7  --   HGB 13.7 13.6 14.6 14.8 15.1*  HCT 42.0 41.5 45.0 45.6 47.4*  MCV 91.1 90.2 89.6 91.6 93.5  PLT 243 262 541* 513* 477*    Basic Metabolic Panel: Recent Labs  Lab 06/08/20 1243 06/09/20 0722 06/13/20 1418 06/13/20 1915 06/14/20 0545  NA 132* 136 139 137 136  K 3.6 3.2* 2.9* 2.9* 3.3*  CL 84* 88* 92* 91* 97*  CO2 34* 34* 32 32 26  GLUCOSE 130* 179* 154* 154* 126*  BUN 18 18 10 18 14   CREATININE 1.02* 0.93 0.93 1.01* 0.75  CALCIUM 9.3 8.9 9.6 9.4 8.8*  MG  --   --   --  2.0  --   PHOS  --   --   --  3.7  --     GFR: Estimated Creatinine Clearance: 44.4 mL/min (by C-G formula based on SCr of 0.75 mg/dL).  Liver Function Tests: Recent Labs  Lab 06/08/20 1243 06/09/20 0722 06/13/20 1915 06/14/20 0545  AST 45* 32 34 30  ALT 35 30 45* 39  ALKPHOS 56 51 60 55  BILITOT 0.5 0.5 1.0 0.8  PROT 7.0 6.5 7.1 6.4*  ALBUMIN 3.6 3.1* 3.8 3.4*    CBG: No results for input(s): GLUCAP in the last 168 hours.   Recent Results (from the past 240 hour(s))  SARS Coronavirus 2 by RT PCR (hospital order, performed in [facility] hospital lab) Nasopharyngeal Nasopharyngeal Swab     Status: Abnormal   Collection Time: 06/08/20   1:01 PM   Specimen: Nasopharyngeal Swab  Result Value Ref Range Status   SARS Coronavirus 2 POSITIVE (A) NEGATIVE Final    Comment: CRITICAL RESULT CALLED TO, READ BACK BY AND VERIFIED WITH: ANNE TUTTLE,RN @1432  07/31/2021KAY (NOTE) SARS-CoV-2 target nucleic acids are DETECTED  SARS-CoV-2 RNA is generally detectable in upper respiratory specimens  during the acute phase of infection.  Positive results are indicative  of the presence of the identified virus, but do not rule out bacterial infection or co-infection with other pathogens not detected by the test.  Clinical correlation with patient history and  other diagnostic information is necessary to determine patient infection status.  The expected result is negative.  Fact Sheet for Patients:   StrictlyIdeas.no   Fact Sheet for Healthcare Providers:   BankingDealers.co.za    This test is not yet approved or cleared by the [place] FDA and  has been authorized for  detection and/or diagnosis of SARS-CoV-2 by FDA under an Emergency Use Authorization (EUA).  This EUA will remain in effect (meanin g this test can be used) for the duration of  the COVID-19 declaration under Section 564(b)(1) of the Act, 21 U.S.C. section 360-bbb-3(b)(1), unless the authorization is terminated or revoked sooner.  Performed at [facility], [address], [place], [place] [address]   Blood Culture (routine x 2)     Status: None   Collection Time: 06/08/20  4:38 PM   Specimen: Right Antecubital; Blood  Result Value Ref Range Status   Specimen Description RIGHT ANTECUBITAL  Final   Special Requests   Final    BOTTLES DRAWN AEROBIC AND ANAEROBIC Blood Culture results may not be optimal due to an excessive volume of blood received in culture bottles   Culture   Final    NO GROWTH 5 DAYS Performed at [facility], [address] [place], [place] [address]    Report Status 06/13/2020 FINAL  Final   Blood Culture (routine x 2)     Status: None   Collection Time: 06/08/20  4:38 PM   Specimen: Left Antecubital; Blood  Result Value Ref Range Status   Specimen Description LEFT ANTECUBITAL  Final   Special Requests   Final    BOTTLES DRAWN AEROBIC AND ANAEROBIC Blood Culture results may not be optimal due to an inadequate volume of blood received in culture bottles   Culture   Final    NO GROWTH 5 DAYS Performed at [facility], [address], [place], [place] [address]    Report Status 06/13/2020 FINAL  Final     Radiology Studies: CT Head Wo Contrast  Result Date: 06/13/2020 CLINICAL DATA:  Worsening fatigue with mental status change EXAM: CT HEAD WITHOUT CONTRAST TECHNIQUE: Contiguous axial images were obtained from the base of the skull through the vertex without intravenous contrast. COMPARISON:  MRI 01/28/2015 CT 01/27/2015 FINDINGS: Brain: No acute territorial infarction, hemorrhage, or intracranial mass. Mild atrophy. Patchy hypodensity in the white matter consistent with chronic small vessel ischemic change. Nonenlarged ventricles. Vascular: No hyperdense vessels.  Carotid vascular calcification Skull: Normal. Negative for fracture or focal lesion. Sinuses/Orbits: No acute finding. Other: None IMPRESSION: 1. No CT evidence for acute intracranial abnormality. 2. Atrophy and chronic small vessel ischemic changes of the white matter. Electronically Signed   By: Donavan Foil M.D.   On: 06/13/2020 20:15   DG Chest Port 1 View  Result Date: 06/13/2020 CLINICAL DATA:  Weakness with recent COVID positive diagnosis. EXAM: PORTABLE CHEST 1 VIEW COMPARISON:  June 08, 2020 FINDINGS: There is no evidence of acute infiltrate, pleural effusion or pneumothorax. Mild, stable elevation of the right hemidiaphragm is seen. The heart size and mediastinal contours are within normal limits. Radiopaque surgical clips are seen overlying the right upper quadrant. The visualized skeletal structures are  unremarkable. IMPRESSION: No active disease. Electronically Signed   By: Virgina Norfolk M.D.   On: 06/13/2020 19:30    Scheduled Meds: . cloNIDine  0.1 mg Oral QHS  . dexamethasone  6 mg Oral Q24H  . [START ON 06/15/2020] levothyroxine  100 mcg Oral QAC breakfast  . metoprolol succinate  100 mg Oral Daily  . pantoprazole  40 mg Oral BID  . rosuvastatin  10 mg Oral Daily   Continuous Infusions: . 0.9 % NaCl with KCl 40 mEq / L       LOS: 0 days    Time spent: 35 minutes.  Barton Dubois, MD [facility]   To contact the attending provider between 7A-7P or the covering provider during after hours 7P-7A, please log into the web site [email or web address] and access using universal [place] password for that web site. If you do not have the password, please call the hospital operator.  06/14/2020, 5:55 PM

## 2020-06-14 NOTE — Care Management Obs Status (Signed)
[place] NOTIFICATION   Patient Details  Name: Aleshka Corney MRN: [id number] Date of Birth: Mar 07, 1933   Medicare Observation Status Notification Given:  Yes (copy mailed to daughter Cherylann Parr at [address] [place], [place] [address])    Tommy Medal 06/14/2020, 2:48 PM

## 2020-06-15 DIAGNOSIS — K2970 Gastritis, unspecified, without bleeding: Secondary | ICD-10-CM | POA: Diagnosis present

## 2020-06-15 DIAGNOSIS — E876 Hypokalemia: Secondary | ICD-10-CM | POA: Diagnosis present

## 2020-06-15 DIAGNOSIS — Z833 Family history of diabetes mellitus: Secondary | ICD-10-CM | POA: Diagnosis not present

## 2020-06-15 DIAGNOSIS — Z8249 Family history of ischemic heart disease and other diseases of the circulatory system: Secondary | ICD-10-CM | POA: Diagnosis not present

## 2020-06-15 DIAGNOSIS — Z8673 Personal history of transient ischemic attack (TIA), and cerebral infarction without residual deficits: Secondary | ICD-10-CM | POA: Diagnosis not present

## 2020-06-15 DIAGNOSIS — Z79899 Other long term (current) drug therapy: Secondary | ICD-10-CM | POA: Diagnosis not present

## 2020-06-15 DIAGNOSIS — R112 Nausea with vomiting, unspecified: Secondary | ICD-10-CM | POA: Diagnosis not present

## 2020-06-15 DIAGNOSIS — Z823 Family history of stroke: Secondary | ICD-10-CM | POA: Diagnosis not present

## 2020-06-15 DIAGNOSIS — R531 Weakness: Secondary | ICD-10-CM

## 2020-06-15 DIAGNOSIS — K5900 Constipation, unspecified: Secondary | ICD-10-CM | POA: Diagnosis present

## 2020-06-15 DIAGNOSIS — M797 Fibromyalgia: Secondary | ICD-10-CM | POA: Diagnosis present

## 2020-06-15 DIAGNOSIS — R7989 Other specified abnormal findings of blood chemistry: Secondary | ICD-10-CM | POA: Diagnosis present

## 2020-06-15 DIAGNOSIS — Z885 Allergy status to narcotic agent status: Secondary | ICD-10-CM | POA: Diagnosis not present

## 2020-06-15 DIAGNOSIS — Z800 Family history of malignant neoplasm of digestive organs: Secondary | ICD-10-CM | POA: Diagnosis not present

## 2020-06-15 DIAGNOSIS — Z79890 Hormone replacement therapy (postmenopausal): Secondary | ICD-10-CM | POA: Diagnosis not present

## 2020-06-15 DIAGNOSIS — Z7982 Long term (current) use of aspirin: Secondary | ICD-10-CM | POA: Diagnosis not present

## 2020-06-15 DIAGNOSIS — Z87442 Personal history of urinary calculi: Secondary | ICD-10-CM | POA: Diagnosis not present

## 2020-06-15 DIAGNOSIS — K219 Gastro-esophageal reflux disease without esophagitis: Secondary | ICD-10-CM | POA: Diagnosis present

## 2020-06-15 DIAGNOSIS — I10 Essential (primary) hypertension: Secondary | ICD-10-CM | POA: Diagnosis not present

## 2020-06-15 DIAGNOSIS — R4182 Altered mental status, unspecified: Secondary | ICD-10-CM | POA: Diagnosis present

## 2020-06-15 DIAGNOSIS — E785 Hyperlipidemia, unspecified: Secondary | ICD-10-CM | POA: Diagnosis not present

## 2020-06-15 DIAGNOSIS — R54 Age-related physical debility: Secondary | ICD-10-CM | POA: Diagnosis present

## 2020-06-15 DIAGNOSIS — Z905 Acquired absence of kidney: Secondary | ICD-10-CM | POA: Diagnosis not present

## 2020-06-15 DIAGNOSIS — E063 Autoimmune thyroiditis: Secondary | ICD-10-CM | POA: Diagnosis not present

## 2020-06-15 DIAGNOSIS — U071 COVID-19: Secondary | ICD-10-CM | POA: Diagnosis present

## 2020-06-15 DIAGNOSIS — R7401 Elevation of levels of liver transaminase levels: Secondary | ICD-10-CM | POA: Diagnosis present

## 2020-06-15 LAB — BASIC METABOLIC PANEL
Anion gap: 13 (ref 5–15)
BUN: 16 mg/dL (ref 8–23)
CO2: 28 mmol/L (ref 22–32)
Calcium: 9.8 mg/dL (ref 8.9–10.3)
Chloride: 99 mmol/L (ref 98–111)
Creatinine, Ser: 0.84 mg/dL (ref 0.44–1.00)
GFR calc Af Amer: >60 mL/min (ref >60)
GFR calc non Af Amer: >60 mL/min (ref >60)
Glucose, Bld: 149 mg/dL — ABNORMAL HIGH (ref 70–99)
Potassium: 3.7 mmol/L (ref 3.5–5.1)
Sodium: 140 mmol/L (ref 135–145)

## 2020-06-15 MED ORDER — POTASSIUM CHLORIDE IN NACL 40-0.9 MEQ/L-% IV SOLN: INTRAVENOUS | Status: AC

## 2020-06-15 NOTE — Progress Notes (Signed)
PROGRESS NOTE    Brenda Erickson  ACZ:[id number] DOB: 1933-02-09 DOA: 06/13/2020 PCP: Perlie Mayo, NP    Chief Complaint  Patient presents with  . Abdominal Pain  . Fever    Brief Narrative:  As per H&P written by Dr. Olevia Bowens on 06/13/20 86 y.o. female with medical history significant of acquired trigger finger, osteoarthritis, mice the left kidney, constipation, diverticulosis, esophageal reflux, H. pylori gastritis, fibromyalgia, goiter, Hashimoto's disease, acquired hypothyroidism, hyperlipidemia, hypertension, urolithiasis, TIA who lives alone, although her daughter lives across the street, who is brought to the emergency department due to 3 to 4 days of nausea and emesis and confusion. She is unable to provide further information, but is able to answer simple questions. She denies headache, chest pain, abdominal or back pain at this time.   ED Course: Initial vital signs were temperature 100.3 F, pulse 77, respiration 19, blood pressure 141/84 mmHg O2 sat 97% on room air. The patient received 650 mg of Tylenol and KCl 10 mEq IVPB x2.  CBC showed a white count of 9.1, hemoglobin 14.8 g/dL and platelets 513. CMP shows a potassium of 2.9 chloride 91 mmol/L. All other electrolytes are within normal range. Glucose 154, creatinine 1.01 mg/dL. ALT is slightly elevated at 45 units/L, but all other hepatic function tests are within expected range. Lactic acid, ammonia, phosphorus and magnesium were normal.  Assessment & Plan: 1-nausea and vomiting -Still with ongoing nausea; intermittently and demonstrating poor oral intake -Continue slowly advancing diet -Continue as needed antiemetics -Continue the use of PPI BID and supportive care. -Most likely secondary to underlying history of Covid infection along with gastritis.  2-HTN (hypertension) -Stable and rising -Continue slowly resuming antihypertensive medication, holding diuretics. -Follow vital signs.  3-Hyperlipidemia LDL goal  <70 -Continue Crestor  4-Acquired autoimmune hypothyroidism -Continue Synthroid  5-Hypokalemia -Continue repletion and follow trend -Check magnesium level  6-recent Covid infection -Patient completed remdesivir infusions and is in the process of completing Decadron. -No requiring oxygen supplementation and expressed no shortness of breath -Continue supportive care and as needed bronchodilators. -continue isolation protocols.  7-physical deconditioning -Physical therapy has been consulted to assess/evaluate patient and provide recommendations -Was living alone prior to admission, but currently with limitations to performed ADL's.   DVT prophylaxis: SCDs Code Status: Full code Family Communication: No family at bedside. Daughter (Mrs Jeani Hawking) updated over the phone on 06/15/20 Disposition:   Status is: patient with inability to maintain adequate hydration/nutrition own her own; will continue IVF's, electrolytes repletion and PRN antiemetics. PT eval requested and will follow recommendations. TOC informed. Patient meeting inpatient criteria currently.    Dispo: The patient is from: home              Anticipated d/c is to: home              Anticipated d/c date is: to be determined.               Patient currently no medically stable; still with ongoing intermittent nausea, poor oral intake, weak and deconditioned; at high risk for falling and hurt herself w/o adequate assistance and support. Patient will benefit of PT evaluation and continue gentle IVF's/electrolyte supervision/repletion while advancing her diet. TOC consulted for assistance.     Consultants:   None   Procedures:  See below for x-ray reports.   Antimicrobials:  None   Subjective: Patient reports improvement in her abdominal pain; still no eating much and complaining of intermittent nausea.  She remains weak, deconditioned  and a high risk for falling without assistance.   Objective: Vitals:   06/15/20 0000  06/15/20 0030 06/15/20 0100 06/15/20 0921  BP: 125/78 (!) 158/73 (!) 171/82 (!) 155/95  Pulse: 69 72 73 78  Resp: 13 12 15 18   Temp:   98 F (36.7 C) 98.7 F (37.1 C)  TempSrc:    Axillary  SpO2: 96% 94% 97% 98%  Weight:      Height:        Intake/Output Summary (Last 24 hours) at 06/15/2020 1301 Last data filed at 06/15/2020 0900 Gross per 24 hour  Intake 843.57 ml  Output --  Net 843.57 ml   Filed Weights   06/13/20 1823  Weight: 59 kg    Examination: General exam: Alert, awake, oriented x 2; reports decreased appetite and intermittent nausea; no further vomiting or abdominal pain currently.  No shortness of breath and good oxygen saturation on room air appreciated. Respiratory system: Clear to auscultation. Respiratory effort normal.  No using accessory muscles. Cardiovascular system: RRR, no rubs, no gallops, no JVD. Gastrointestinal system: Abdomen is nondistended, soft and nontender. No organomegaly or masses felt. Normal bowel sounds heard. Central nervous system: Alert and oriented. No focal neurological deficits. Extremities: No cyanosis or clubbing; no edema. Skin: No rashes, no petechiae. Psychiatry: Mood & affect appropriate.   Data Reviewed: I have personally reviewed following labs and imaging studies  CBC: Recent Labs  Lab 06/09/20 0722 06/13/20 1418 06/13/20 1915 06/14/20 0545  WBC 3.3* 9.4 9.1 9.9  NEUTROABS 2.3  --  6.7  --   HGB 13.6 14.6 14.8 15.1*  HCT 41.5 45.0 45.6 47.4*  MCV 90.2 89.6 91.6 93.5  PLT 262 541* 513* 477*    Basic Metabolic Panel: Recent Labs  Lab 06/09/20 0722 06/13/20 1418 06/13/20 1915 06/14/20 0545 06/15/20 0840  NA 136 139 137 136 140  K 3.2* 2.9* 2.9* 3.3* 3.7  CL 88* 92* 91* 97* 99  CO2 34* 32 32 26 28  GLUCOSE 179* 154* 154* 126* 149*  BUN 18 10 18 14 16   CREATININE 0.93 0.93 1.01* 0.75 0.84  CALCIUM 8.9 9.6 9.4 8.8* 9.8  MG  --   --  2.0  --   --   PHOS  --   --  3.7  --   --     GFR: Estimated  Creatinine Clearance: 42.3 mL/min (by C-G formula based on SCr of 0.84 mg/dL).  Liver Function Tests: Recent Labs  Lab 06/09/20 0722 06/13/20 1915 06/14/20 0545  AST 32 34 30  ALT 30 45* 39  ALKPHOS 51 60 55  BILITOT 0.5 1.0 0.8  PROT 6.5 7.1 6.4*  ALBUMIN 3.1* 3.8 3.4*    CBG: No results for input(s): GLUCAP in the last 168 hours.   Recent Results (from the past 240 hour(s))  SARS Coronavirus 2 by RT PCR (hospital order, performed in [facility] hospital lab) Nasopharyngeal Nasopharyngeal Swab     Status: Abnormal   Collection Time: 06/08/20  1:01 PM   Specimen: Nasopharyngeal Swab  Result Value Ref Range Status   SARS Coronavirus 2 POSITIVE (A) NEGATIVE Final    Comment: CRITICAL RESULT CALLED TO, READ BACK BY AND VERIFIED WITH: ANNE TUTTLE,RN @1432  07/31/2021KAY (NOTE) SARS-CoV-2 target nucleic acids are DETECTED  SARS-CoV-2 RNA is generally detectable in upper respiratory specimens  during the acute phase of infection.  Positive results are indicative  of the presence of the identified virus, but do not rule out bacterial infection  or co-infection with other pathogens not detected by the test.  Clinical correlation with patient history and  other diagnostic information is necessary to determine patient infection status.  The expected result is negative.  Fact Sheet for Patients:   StrictlyIdeas.no   Fact Sheet for Healthcare Providers:   BankingDealers.co.za    This test is not yet approved or cleared by the [place] FDA and  has been authorized for detection and/or diagnosis of SARS-CoV-2 by FDA under an Emergency Use Authorization (EUA).  This EUA will remain in effect (meanin g this test can be used) for the duration of  the COVID-19 declaration under Section 564(b)(1) of the Act, 21 U.S.C. section 360-bbb-3(b)(1), unless the authorization is terminated or revoked sooner.  Performed at [facility],  [address], [place], [place] [address]   Blood Culture (routine x 2)     Status: None   Collection Time: 06/08/20  4:38 PM   Specimen: Right Antecubital; Blood  Result Value Ref Range Status   Specimen Description RIGHT ANTECUBITAL  Final   Special Requests   Final    BOTTLES DRAWN AEROBIC AND ANAEROBIC Blood Culture results may not be optimal due to an excessive volume of blood received in culture bottles   Culture   Final    NO GROWTH 5 DAYS Performed at [facility], [address] [place], [place] [address]    Report Status 06/13/2020 FINAL  Final  Blood Culture (routine x 2)     Status: None   Collection Time: 06/08/20  4:38 PM   Specimen: Left Antecubital; Blood  Result Value Ref Range Status   Specimen Description LEFT ANTECUBITAL  Final   Special Requests   Final    BOTTLES DRAWN AEROBIC AND ANAEROBIC Blood Culture results may not be optimal due to an inadequate volume of blood received in culture bottles   Culture   Final    NO GROWTH 5 DAYS Performed at [facility], [address] [place], [place] [address]    Report Status 06/13/2020 FINAL  Final     Radiology Studies: CT Head Wo Contrast  Result Date: 06/13/2020 CLINICAL DATA:  Worsening fatigue with mental status change EXAM: CT HEAD WITHOUT CONTRAST TECHNIQUE: Contiguous axial images were obtained from the base of the skull through the vertex without intravenous contrast. COMPARISON:  MRI 01/28/2015 CT 01/27/2015 FINDINGS: Brain: No acute territorial infarction, hemorrhage, or intracranial mass. Mild atrophy. Patchy hypodensity in the white matter consistent with chronic small vessel ischemic change. Nonenlarged ventricles. Vascular: No hyperdense vessels.  Carotid vascular calcification Skull: Normal. Negative for fracture or focal lesion. Sinuses/Orbits: No acute finding. Other: None IMPRESSION: 1. No CT evidence for acute intracranial abnormality. 2. Atrophy and chronic small vessel ischemic changes of the white matter.  Electronically Signed   By: Donavan Foil M.D.   On: 06/13/2020 20:15   DG Chest Port 1 View  Result Date: 06/13/2020 CLINICAL DATA:  Weakness with recent COVID positive diagnosis. EXAM: PORTABLE CHEST 1 VIEW COMPARISON:  June 08, 2020 FINDINGS: There is no evidence of acute infiltrate, pleural effusion or pneumothorax. Mild, stable elevation of the right hemidiaphragm is seen. The heart size and mediastinal contours are within normal limits. Radiopaque surgical clips are seen overlying the right upper quadrant. The visualized skeletal structures are unremarkable. IMPRESSION: No active disease. Electronically Signed   By: Virgina Norfolk M.D.   On: 06/13/2020 19:30    Scheduled Meds: . cloNIDine  0.1 mg Oral QHS  . dexamethasone  6 mg Oral Q24H  . levothyroxine  100 mcg Oral QAC breakfast  . metoprolol succinate  100 mg Oral Daily  . pantoprazole  40 mg Oral BID  . rosuvastatin  10 mg Oral Daily   Continuous Infusions: . 0.9 % NaCl with KCl 40 mEq / L       LOS: 0 days    Time spent: 35 minutes.    Barton Dubois, MD [facility]   To contact the attending provider between 7A-7P or the covering provider during after hours 7P-7A, please log into the web site [email or web address] and access using universal [place] password for that web site. If you do not have the password, please call the hospital operator.  06/15/2020, 1:01 PM

## 2020-06-16 LAB — CBC
HCT: 38.5 % (ref 36.0–46.0)
Hemoglobin: 12.4 g/dL (ref 12.0–15.0)
MCH: 29.5 pg (ref 26.0–34.0)
MCHC: 32.2 g/dL (ref 30.0–36.0)
MCV: 91.7 fL (ref 80.0–100.0)
Platelets: 454 10*3/uL — ABNORMAL HIGH (ref 150–400)
RBC: 4.2 MIL/uL (ref 3.87–5.11)
RDW: 13.2 % (ref 11.5–15.5)
WBC: 13.7 10*3/uL — ABNORMAL HIGH (ref 4.0–10.5)
nRBC: 0 % (ref 0.0–0.2)

## 2020-06-16 LAB — BASIC METABOLIC PANEL
Anion gap: 8 (ref 5–15)
BUN: 18 mg/dL (ref 8–23)
CO2: 29 mmol/L (ref 22–32)
Calcium: 8.8 mg/dL — ABNORMAL LOW (ref 8.9–10.3)
Chloride: 101 mmol/L (ref 98–111)
Creatinine, Ser: 0.81 mg/dL (ref 0.44–1.00)
GFR calc Af Amer: >60 mL/min (ref >60)
GFR calc non Af Amer: >60 mL/min (ref >60)
Glucose, Bld: 152 mg/dL — ABNORMAL HIGH (ref 70–99)
Potassium: 3.9 mmol/L (ref 3.5–5.1)
Sodium: 138 mmol/L (ref 135–145)

## 2020-06-16 LAB — MAGNESIUM: Magnesium: 1.9 mg/dL (ref 1.7–2.4)

## 2020-06-16 MED ORDER — ENSURE ENLIVE PO LIQD: 237.0000 mL | Freq: Two times a day (BID) | ORAL | Status: DC

## 2020-06-16 MED ORDER — POTASSIUM CHLORIDE IN NACL 40-0.9 MEQ/L-% IV SOLN: INTRAVENOUS | Status: AC

## 2020-06-16 NOTE — Evaluation (Signed)
Physical Therapy Evaluation Patient Details Name: Brenda Erickson MRN: [id number] DOB: Apr 17, 1933 Today's Date: 06/16/2020   History of Present Illness  86 y.o. female with medical history significant of acquired trigger finger, osteoarthritis, mice the left kidney, constipation, diverticulosis, esophageal reflux, H. pylori gastritis, fibromyalgia, goiter, Hashimoto's disease, acquired hypothyroidism, hyperlipidemia, hypertension, urolithiasis, TIA who lives alone, although her daughter lives across the street, who is brought to the emergency department due to 3 to 4 days of nausea and emesis and confusion.  Clinical Impression  Pt states she lives in an apartment no steps has a rolling walker at home.  Pt able to complete bed mobility and room mobility with supervision only.   Follow Up Recommendations No PT follow up    Equipment Recommendations  None recommended by PT    Recommendations for Other Services   none    Precautions / Restrictions Precautions Precautions: None Restrictions Weight Bearing Restrictions: No      Mobility  Bed Mobility Overal bed mobility: Modified Independent                Transfers Overall transfer level: Modified independent Equipment used: Rolling walker (2 wheeled)                Ambulation/Gait Ambulation/Gait assistance: Modified independent (Device/Increase time) Gait Distance (Feet): 50 Feet Assistive device: Rolling walker (2 wheeled) Gait Pattern/deviations: Step-through pattern Gait velocity: slow   General Gait Details: Pt able to ambulate to bathroom and to sink, statnd and wash hands at sink and ambulate in room with rolling walkerr with supervision assist only            Pertinent Vitals/Pain      Home Living Family/patient expects to be discharged to:: Private residence Living Arrangements: Alone Available Help at Discharge: Family;Available PRN/intermittently Type of Home: Apartment Home Access: Level  entry     Home Layout: One level Home Equipment: Cane - single point;Walker - 2 wheels;Bedside commode            Hand Dominance        Extremity/Trunk Assessment   Upper Extremity Assessment Upper Extremity Assessment: Defer to OT evaluation    Lower Extremity Assessment Lower Extremity Assessment: Generalized weakness    Cervical / Trunk Assessment Cervical / Trunk Assessment: Normal  Communication      Cognition Arousal/Alertness: Awake/alert   Overall Cognitive Status: Within Functional Limits for tasks assessed                                               Assessment/Plan    PT Assessment Patient needs continued PT services  PT Problem List Decreased activity tolerance       PT Treatment Interventions Gait training;Therapeutic exercise    PT Goals (Current goals can be found in the Care Plan section)  Acute Rehab PT Goals Patient Stated Goal: to go home PT Goal Formulation: With patient Time For Goal Achievement: 06/17/20 Potential to Achieve Goals: Good    Frequency Min 2X/week                          AM-PAC PT "6 Clicks" Mobility  Outcome Measure Help needed turning from your back to your side while in a flat bed without using bedrails?: None Help needed moving from lying on your back to sitting on the side  of a flat bed without using bedrails?: None Help needed moving to and from a bed to a chair (including a wheelchair)?: None Help needed standing up from a chair using your arms (e.g., wheelchair or bedside chair)?: None Help needed to walk in hospital room?: A Little Help needed climbing 3-5 steps with a railing? : A Little 6 Click Score: 22    End of Session Equipment Utilized During Treatment: Gait belt Activity Tolerance: Patient tolerated treatment well Patient left: in chair;with call bell/phone within reach   PT Visit Diagnosis: Muscle weakness (generalized) (M62.81)    Time: 0164-2903 PT Time  Calculation (min) (ACUTE ONLY): 25 min   Charges:   PT Evaluation $PT Eval Low Complexity: Cass[place], PT CLT [phone] 06/16/2020, 12:10 PM

## 2020-06-16 NOTE — Progress Notes (Signed)
PROGRESS NOTE    Brenda Erickson  JKD:[id number] DOB: May 31, 1933 DOA: 06/13/2020 PCP: Perlie Mayo, NP    Chief Complaint  Patient presents with  . Abdominal Pain  . Fever    Brief Narrative:  As per H&P written by Dr. Olevia Bowens on 06/13/20 86 y.o. female with medical history significant of acquired trigger finger, osteoarthritis, mice the left kidney, constipation, diverticulosis, esophageal reflux, H. pylori gastritis, fibromyalgia, goiter, Hashimoto's disease, acquired hypothyroidism, hyperlipidemia, hypertension, urolithiasis, TIA who lives alone, although her daughter lives across the street, who is brought to the emergency department due to 3 to 4 days of nausea and emesis and confusion. She is unable to provide further information, but is able to answer simple questions. She denies headache, chest pain, abdominal or back pain at this time.   ED Course: Initial vital signs were temperature 100.3 F, pulse 77, respiration 19, blood pressure 141/84 mmHg O2 sat 97% on room air. The patient received 650 mg of Tylenol and KCl 10 mEq IVPB x2.  CBC showed a white count of 9.1, hemoglobin 14.8 g/dL and platelets 513. CMP shows a potassium of 2.9 chloride 91 mmol/L. All other electrolytes are within normal range. Glucose 154, creatinine 1.01 mg/dL. ALT is slightly elevated at 45 units/L, but all other hepatic function tests are within expected range. Lactic acid, ammonia, phosphorus and magnesium were normal.  Assessment & Plan: 1-nausea and vomiting -Still with ongoing nausea; intermittently and demonstrating poor oral intake -Continue slowly advancing diet -Continue as needed antiemetics -Continue the use of PPI BID and supportive care. -Most likely secondary to underlying history of Covid infection along with gastritis. -Started on Ensure  2-HTN (hypertension) -Stable and rising -Continue slowly resuming antihypertensive medication, holding diuretics. -Follow vital  signs.  3-Hyperlipidemia LDL goal <70 -Continue Crestor  4-Acquired autoimmune hypothyroidism -Continue Synthroid  5-Hypokalemia -Continue repletion and follow trend -Check magnesium level  6-recent Covid infection -Patient completed remdesivir infusions and is in the process of completing Decadron. -No requiring oxygen supplementation and expressed no shortness of breath -Continue supportive care and as needed bronchodilators. -continue isolation protocols.  7-physical deconditioning -Physical therapy has been consulted to assess/evaluate patient and provide recommendations -Was living alone prior to admission, but currently with limitations to performed ADL's.   DVT prophylaxis: SCDs Code Status: Full code Family Communication: No family at bedside. Daughter (Mrs Jeani Hawking) updated over the phone on 06/15/20 Disposition:   Status is: patient with inability to maintain adequate hydration/nutrition own her own; will continue IVF's, electrolytes repletion and PRN antiemetics. PT eval appreciated; no recommendations for a skilled nursing facility or home health services provided.  Will wait on improvement in her oral intake and reassess outpatient social support for safe discharge.  Dispo: The patient is from: home              Anticipated d/c is to: home              Anticipated d/c date is: to be determined.               Patient currently no medically stable; still with ongoing intermittent nausea and very poor oral intake.TOC consulted for assistance reassessing outpatient social support for safe discharge.  Continue gentle hydration.  And follow electrolytes.    Consultants:   None   Procedures:  See below for x-ray reports.   Antimicrobials:  None   Subjective: Mild nausea, no abdominal pain, no vomiting, no chest pain, no shortness of breath, good oxygen saturation on  room air.  Still with very poor oral intake.  Objective: Vitals:   06/15/20 1400 06/15/20 2122  06/16/20 0539 06/16/20 1043  BP: (!) 160/78 (!) 152/72 (!) 144/72 (!) 141/74  Pulse: 67 66 64 69  Resp: 17 18 14 16   Temp: 98.1 F (36.7 C)  98.2 F (36.8 C) 98.2 F (36.8 C)  TempSrc: Oral  Oral Oral  SpO2: 96% 99% 96% 99%  Weight:      Height:        Intake/Output Summary (Last 24 hours) at 06/16/2020 1551 Last data filed at 06/16/2020 0400 Gross per 24 hour  Intake 1059.67 ml  Output --  Net 1059.67 ml   Filed Weights   06/13/20 1823  Weight: 59 kg    Examination: General exam: Alert, awake, oriented x 2; still with poor oral intake, no chest pain, no further episode of vomiting or abdominal pain.  Reports mild nausea.  Afebrile.  Good oxygen saturation on room air. Respiratory system: Clear to auscultation. Respiratory effort normal. Cardiovascular system:RRR. No rubs or gallops.  No JVD Gastrointestinal system: Abdomen is nondistended, soft and nontender. No organomegaly or masses felt. Normal bowel sounds heard. Central nervous system: Alert and oriented. No focal neurological deficits. Extremities: No cyanosis or clubbing. Skin: No rashes, no petechiae. Psychiatry: Mood & affect appropriate.   Data Reviewed: I have personally reviewed following labs and imaging studies  CBC: Recent Labs  Lab 06/13/20 1418 06/13/20 1915 06/14/20 0545 06/16/20 0758  WBC 9.4 9.1 9.9 13.7*  NEUTROABS  --  6.7  --   --   HGB 14.6 14.8 15.1* 12.4  HCT 45.0 45.6 47.4* 38.5  MCV 89.6 91.6 93.5 91.7  PLT 541* 513* 477* 454*    Basic Metabolic Panel: Recent Labs  Lab 06/13/20 1418 06/13/20 1915 06/14/20 0545 06/15/20 0840 06/16/20 0758  NA 139 137 136 140 138  K 2.9* 2.9* 3.3* 3.7 3.9  CL 92* 91* 97* 99 101  CO2 32 32 26 28 29   GLUCOSE 154* 154* 126* 149* 152*  BUN 10 18 14 16 18   CREATININE 0.93 1.01* 0.75 0.84 0.81  CALCIUM 9.6 9.4 8.8* 9.8 8.8*  MG  --  2.0  --   --  1.9  PHOS  --  3.7  --   --   --     GFR: Estimated Creatinine Clearance: 43.8 mL/min (by C-G  formula based on SCr of 0.81 mg/dL).  Liver Function Tests: Recent Labs  Lab 06/13/20 1915 06/14/20 0545  AST 34 30  ALT 45* 39  ALKPHOS 60 55  BILITOT 1.0 0.8  PROT 7.1 6.4*  ALBUMIN 3.8 3.4*    CBG: No results for input(s): GLUCAP in the last 168 hours.   Recent Results (from the past 240 hour(s))  SARS Coronavirus 2 by RT PCR (hospital order, performed in [facility] hospital lab) Nasopharyngeal Nasopharyngeal Swab     Status: Abnormal   Collection Time: 06/08/20  1:01 PM   Specimen: Nasopharyngeal Swab  Result Value Ref Range Status   SARS Coronavirus 2 POSITIVE (A) NEGATIVE Final    Comment: CRITICAL RESULT CALLED TO, READ BACK BY AND VERIFIED WITH: ANNE TUTTLE,RN @1432  07/31/2021KAY (NOTE) SARS-CoV-2 target nucleic acids are DETECTED  SARS-CoV-2 RNA is generally detectable in upper respiratory specimens  during the acute phase of infection.  Positive results are indicative  of the presence of the identified virus, but do not rule out bacterial infection or co-infection with other pathogens not detected by  the test.  Clinical correlation with patient history and  other diagnostic information is necessary to determine patient infection status.  The expected result is negative.  Fact Sheet for Patients:   StrictlyIdeas.no   Fact Sheet for Healthcare Providers:   BankingDealers.co.za    This test is not yet approved or cleared by the [place] FDA and  has been authorized for detection and/or diagnosis of SARS-CoV-2 by FDA under an Emergency Use Authorization (EUA).  This EUA will remain in effect (meanin g this test can be used) for the duration of  the COVID-19 declaration under Section 564(b)(1) of the Act, 21 U.S.C. section 360-bbb-3(b)(1), unless the authorization is terminated or revoked sooner.  Performed at [facility], [address], [place], [place] [address]   Blood Culture (routine x 2)     Status:  None   Collection Time: 06/08/20  4:38 PM   Specimen: Right Antecubital; Blood  Result Value Ref Range Status   Specimen Description RIGHT ANTECUBITAL  Final   Special Requests   Final    BOTTLES DRAWN AEROBIC AND ANAEROBIC Blood Culture results may not be optimal due to an excessive volume of blood received in culture bottles   Culture   Final    NO GROWTH 5 DAYS Performed at [facility], [address], [place], [place] [address]    Report Status 06/13/2020 FINAL  Final  Blood Culture (routine x 2)     Status: None   Collection Time: 06/08/20  4:38 PM   Specimen: Left Antecubital; Blood  Result Value Ref Range Status   Specimen Description LEFT ANTECUBITAL  Final   Special Requests   Final    BOTTLES DRAWN AEROBIC AND ANAEROBIC Blood Culture results may not be optimal due to an inadequate volume of blood received in culture bottles   Culture   Final    NO GROWTH 5 DAYS Performed at [facility], [address], [place], [place] [address]    Report Status 06/13/2020 FINAL  Final     Radiology Studies: No results found.  Scheduled Meds: . cloNIDine  0.1 mg Oral QHS  . dexamethasone  6 mg Oral Q24H  . feeding supplement (ENSURE ENLIVE)  237 mL Oral BID BM  . levothyroxine  100 mcg Oral QAC breakfast  . metoprolol succinate  100 mg Oral Daily  . pantoprazole  40 mg Oral BID  . rosuvastatin  10 mg Oral Daily   Continuous Infusions:    LOS: 1 day    Time spent: 30 minutes.    Barton Dubois, MD [facility]   To contact the attending provider between 7A-7P or the covering provider during after hours 7P-7A, please log into the web site [email or web address] and access using universal [place] password for that web site. If you do not have the password, please call the hospital operator.  06/16/2020, 3:51 PM

## 2020-06-17 MED ORDER — PANTOPRAZOLE SODIUM 40 MG PO TBEC: 40.0000 mg | DELAYED_RELEASE_TABLET | Freq: Two times a day (BID) | ORAL | 1 refills | Status: AC

## 2020-06-17 MED ORDER — ENSURE ENLIVE PO LIQD: 237.0000 mL | Freq: Two times a day (BID) | ORAL | Status: AC

## 2020-06-17 MED ORDER — TORSEMIDE 20 MG PO TABS: 20.0000 mg | ORAL_TABLET | Freq: Every day | ORAL | Status: DC | PRN

## 2020-06-17 MED ORDER — AMLODIPINE BESYLATE 10 MG PO TABS: 5.0000 mg | ORAL_TABLET | Freq: Every day | ORAL | Status: DC

## 2020-06-17 NOTE — TOC Initial Note (Signed)
Transition of Care [facility]) - Initial/Assessment Note    Patient Details  Name: Brenda Erickson MRN: [id number] Date of Birth: October 04, 1933  [facility]) CM/SW Contact:    Salome Arnt, [place] Phone Number: 06/17/2020, 2:10 PM  Clinical Narrative:  Pt admitted due to nausea/vomiting and positive for COVID on 7/31. LCSW attempted to reach pt by phone, but no answer. TOC consulted for d/c planning. LCSW spoke with pt's daughter, Maudie Mercury on phone. Maudie Mercury states pt lives alone and typically manages pretty well at home. Her other daughter lives across the street, but works during the day. Pt ambulates with cane at baseline, but walks slowly per Maudie Mercury. PT evaluated pt yesterday and no follow up needed. LCSW discussed with Maudie Mercury. Maudie Mercury initially concerned about COVID and pt needing family assistance upon returning home. Discussed need for skilled need for [facility] authorization at SNF and also discussed possibility of ALF. Kim aware pt would turn over check to facility and she states this is not possible. They are working on getting pt CAP aid, but pt has not been approved yet. She had CAP aid several years ago, but then lost service. Kim called her sister and then called LCSW back and said that they want pt to return home and will be available to assist pt. MD updated and plans to d/c pt today. MD to call family prior to d/c.                  Expected Discharge Plan: Home/Self Care Barriers to Discharge: Barriers Resolved   Patient Goals and CMS Choice Patient states their goals for this hospitalization and ongoing recovery are:: return home      Expected Discharge Plan and Services Expected Discharge Plan: Home/Self Care In-house Referral: Clinical Social Work   Post Acute Care Choice: NA Living arrangements for the past 2 months: Apartment                                      Prior Living Arrangements/Services Living arrangements for the past 2 months: Apartment Lives with::  Self   Do you feel safe going back to the place where you live?: Yes      Need for Family Participation in Patient Care: Yes (Comment) Care giver support system in place?: Yes (comment) Current home services: DME (Cane, walker, shower chair, 3N1) Criminal Activity/Legal Involvement Pertinent to Current Situation/Hospitalization: No - Comment as needed  Activities of Daily Living Home Assistive Devices/Equipment: None ADL Screening (condition at time of admission) Patient's cognitive ability adequate to safely complete daily activities?: No Is the patient deaf or have difficulty hearing?: No Does the patient have difficulty seeing, even when wearing glasses/contacts?: No Does the patient have difficulty concentrating, remembering, or making decisions?: Yes Patient able to express need for assistance with ADLs?: Yes Does the patient have difficulty dressing or bathing?: No Independently performs ADLs?: No Communication: Needs assistance Dressing (OT): Needs assistance Is this a change from baseline?: Pre-admission baseline Grooming: Needs assistance Is this a change from baseline?: Pre-admission baseline Feeding: Needs assistance Is this a change from baseline?: Pre-admission baseline Bathing: Needs assistance Is this a change from baseline?: Pre-admission baseline Toileting: Needs assistance Is this a change from baseline?: Pre-admission baseline In/Out Bed: Needs assistance Is this a change from baseline?: Pre-admission baseline Walks in Home: Needs assistance Is this a change from baseline?: Pre-admission baseline Does the patient have difficulty walking  or climbing stairs?: Yes Weakness of Legs: Both Weakness of Arms/Hands: Both  Permission Sought/Granted                  Emotional Assessment   Attitude/Demeanor/Rapport: Unable to Assess Affect (typically observed): Unable to Assess   Alcohol / Substance Use: Not Applicable    Admission diagnosis:  Hypokalemia  [E87.6] Weakness [R53.1] Nausea and vomiting [R11.2] Altered mental status, unspecified altered mental status type [R41.82] COVID-19 [U07.1] Nausea & vomiting [R11.2] Patient Active Problem List   Diagnosis Date Noted  . Nausea & vomiting 06/15/2020  . Weakness   . Nausea and vomiting 06/13/2020  . Hypokalemia 06/13/2020  . Thrombocytosis ([place]) 06/13/2020  . ALT (SGPT) level raised 06/13/2020  . COVID-19 06/08/2020  . Annual visit for general adult medical examination with abnormal findings 03/19/2020  . Arthritis 12/20/2019  . Forgetfulness 12/06/2019  . Hyperlipidemia LDL goal <70 01/29/2015  . Acquired autoimmune hypothyroidism 05/16/2013  . HTN (hypertension) 04/22/2009  . GERD (gastroesophageal reflux disease) 04/22/2009  . Fibromyalgia 02/11/2009   PCP:  Perlie Mayo, NP Pharmacy:   [facility], [place] [address] AT [address] [place] [address] Phone: [phone] Fax: [phone]     Social Determinants of Health (SDOH) Interventions    Readmission Risk Interventions No flowsheet data found.

## 2020-06-17 NOTE — Progress Notes (Signed)
Physician Discharge Summary  Brenda Erickson ZHG:[id number] DOB: 1932-12-09 DOA: 06/13/2020  PCP: Perlie Mayo, NP  Admit date: 06/13/2020 Discharge date: 06/17/2020  Time spent: 35 minutes  Recommendations for Outpatient Follow-up:  1. Repeat basic metabolic panel to follow across renal function 2. Reassess blood pressure and further adjust antihypertensive regimen 3. Goals of care discussion recommended for advance directive form planning.   Discharge Diagnoses:  Principal Problem:   Nausea and vomiting Active Problems:   HTN (hypertension)   Hyperlipidemia LDL goal <70   Acquired autoimmune hypothyroidism   COVID-19   Hypokalemia   Thrombocytosis (HCC)   ALT (SGPT) level raised   Nausea & vomiting   Weakness   Discharge Condition: Stable and improved.  Discharged home with instruction to follow-up with PCP in 10 days  CODE STATUS: Full code.  Diet recommendation: Heart healthy diet.  Filed Weights   06/13/20 1823  Weight: 59 kg    History of present illness:  As per H&P written by Dr. Olevia Bowens on 06/13/20 86 y.o.femalewith medical history significant ofacquired trigger finger, osteoarthritis, mice the left kidney, constipation, diverticulosis, esophageal reflux, H. pylori gastritis, fibromyalgia, goiter, Hashimoto's disease, acquired hypothyroidism, hyperlipidemia, hypertension, urolithiasis, TIAwho lives alone, although her daughter lives across the street, who is brought to the emergency department due to 3 to 4 days of nausea and emesis and confusion. She is unable to provide further information, but is able to answer simple questions. She denies headache, chest pain, abdominal or back pain at this time.   ED Course:Initial vital signs weretemperature100.3 F, pulse 77, respiration 19, blood pressure 141/84 mmHg O2 sat 97% on room air. The patient received 650 mg of Tylenol and KCl 10 mEq IVPB x2.  CBC showed a white count of 9.1, hemoglobin 14.8 g/dL and  platelets 513. CMP shows a potassium of 2.9 chloride 91 mmol/L. All other electrolytes are within normal range. Glucose 154, creatinine 1.01 mg/dL. ALT is slightly elevated at 45 units/L, but all other hepatic function tests are within expected range. Lactic acid, ammonia, phosphorus and magnesium were normal.  Hospital Course:  1-nausea and vomiting -No abdominal pain, no further nausea or vomiting.  Patient expressed some improvement in her ability for oral intake. -Instructed to maintain adequate hydration. -Continue the use of PPI BID and Carafate. -Most likely secondary to underlying history of Covid infection along with gastritis. -Started on Ensure and encourage to increase oral intake.  2-HTN (hypertension) -Stable and well-controlled: -Continue home antihypertensive regimen -Reassess blood pressure at follow-up visit with PCP and further adjust antihypertensive medications at that time -Patient advised to maintain adequate hydration. -Demadex dosage has been recommended to use on as-needed basis only; minimizing the chances for further electrolyte abnormalities and acute dehydration while patient able to maintain good oral intake.  3-Hyperlipidemia LDL goal <70 -Continue Crestor  4-Acquired autoimmune hypothyroidism -Continue Synthroid  5-Hypokalemia -Repleted and within normal limits -Vaccinations stable.  6-recent Covid infection -Patient completed remdesivir infusions and during this hospitalization has also completed the use of oral Decadron. -No fever, no chest pain, no nausea, no vomiting, no requiring oxygen supplementation and has remained afebrile. -Advised to maintain adequate hydration, to increase nutrition and to continue the use of as needed bronchodilators/antitussive medications. -Tylenol recommended to use as needed for comfort and symptom management.  7-physical deconditioning -Physical therapy has been consulted to assess/evaluate patient and  provide recommendations -Patient has been found close to her baseline and no requiring home health services currently. -She will be discharged home  with family care.  Procedures:  See below for x-ray reports  Consultations:  None  Discharge Exam: Vitals:   06/17/20 0517 06/17/20 1350  BP: (!) 143/69 137/84  Pulse: 66 (!) 58  Resp: 16 16  Temp:  98 F (36.7 C)  SpO2: 93% 99%    General: No fever, no chest pain, no further episode of nausea/vomiting or complaints of abdominal pain.  Patient reports some improvement in her eating ability and would like to go home. Cardiovascular: S1 and S2, no rubs, no gallops, no JVD. Respiratory: Positive scattered rhonchi; no using accessory muscles, good oxygen saturation on room air, no wheezing, no crackles. Abdomen: Soft, nontender, nondistended, positive bowel sounds Extremities: No cyanosis, no clubbing, no edema.  Discharge Instructions   Discharge Instructions    Diet - low sodium heart healthy   Complete by: As directed    Discharge instructions   Complete by: As directed    Take medications as prescribed. Maintain adequate hydration and increased nutrition/oral intake. Arrange follow-up with PCP in 10 days. Follow heart healthy diet.     Allergies as of 06/17/2020      Reactions   Codeine Nausea And Vomiting   Other    NO BLOOD PRODUCTS      Medication List    STOP taking these medications   dexamethasone 6 MG tablet Commonly known as: DECADRON   omeprazole 20 MG capsule Commonly known as: PRILOSEC Replaced by: pantoprazole 40 MG tablet     TAKE these medications   albuterol 108 (90 Base) MCG/ACT inhaler Commonly known as: VENTOLIN HFA Inhale 1-2 puffs into the lungs every 6 (six) hours as needed for wheezing or shortness of breath.   amLODipine 10 MG tablet Commonly known as: NORVASC Take 0.5 tablets (5 mg total) by mouth daily. What changed: how much to take   aspirin 81 MG chewable tablet Chew 81 mg  by mouth in the morning.   cloNIDine 0.1 MG tablet Commonly known as: CATAPRES Take 1 tablet (0.1 mg total) by mouth at bedtime.   feeding supplement (ENSURE ENLIVE) Liqd Take 237 mLs by mouth 2 (two) times daily between meals.   ferrous sulfate 325 (65 FE) MG EC tablet Take 325 mg by mouth daily.   gabapentin 300 MG capsule Commonly known as: NEURONTIN Take 1 capsule (300 mg total) by mouth at bedtime.   guaiFENesin-dextromethorphan 100-10 MG/5ML syrup Commonly known as: ROBITUSSIN DM Take 5 mLs by mouth every 4 (four) hours as needed for cough.   levothyroxine 100 MCG tablet Commonly known as: SYNTHROID Take 100 mcg by mouth daily before breakfast.   metoprolol succinate 100 MG 24 hr tablet Commonly known as: TOPROL-XL Take 1 tablet (100 mg total) by mouth daily. Take with or immediately following a meal.   pantoprazole 40 MG tablet Commonly known as: PROTONIX Take 1 tablet (40 mg total) by mouth 2 (two) times daily. Replaces: omeprazole 20 MG capsule   rosuvastatin 10 MG tablet Commonly known as: CRESTOR Take 1 tablet (10 mg total) by mouth daily.   sodium chloride 0.65 % Soln nasal spray Commonly known as: OCEAN Place 1 spray into both nostrils as needed for congestion.   sucralfate 1 g tablet Commonly known as: Carafate Take 1 tablet (1 g total) by mouth 4 (four) times daily. What changed: when to take this   TEARS AGAIN OP Place 1 drop into both eyes daily as needed (for dry eye relief).   torsemide 20 MG tablet Commonly known  as: DEMADEX Take 1 tablet (20 mg total) by mouth daily as needed (for edema and swelling.). Take 20 mg by mouth daily. What changed:   when to take this  reasons to take this   Vitamin D3 50 MCG (2000 UT) Tabs Take 1 tablet by mouth daily.      Allergies  Allergen Reactions  . Codeine Nausea And Vomiting  . Other     NO BLOOD PRODUCTS    Follow-up Information    Perlie Mayo, NP. Schedule an appointment as soon as  possible for a visit in 10 day(s).   Specialty: Family Medicine Contact information: [address] [place] [address] [phone]               The results of significant diagnostics from this hospitalization (including imaging, microbiology, ancillary and laboratory) are listed below for reference.    Significant Diagnostic Studies: CT Head Wo Contrast  Result Date: 06/13/2020 CLINICAL DATA:  Worsening fatigue with mental status change EXAM: CT HEAD WITHOUT CONTRAST TECHNIQUE: Contiguous axial images were obtained from the base of the skull through the vertex without intravenous contrast. COMPARISON:  MRI 01/28/2015 CT 01/27/2015 FINDINGS: Brain: No acute territorial infarction, hemorrhage, or intracranial mass. Mild atrophy. Patchy hypodensity in the white matter consistent with chronic small vessel ischemic change. Nonenlarged ventricles. Vascular: No hyperdense vessels.  Carotid vascular calcification Skull: Normal. Negative for fracture or focal lesion. Sinuses/Orbits: No acute finding. Other: None IMPRESSION: 1. No CT evidence for acute intracranial abnormality. 2. Atrophy and chronic small vessel ischemic changes of the white matter. Electronically Signed   By: Donavan Foil M.D.   On: 06/13/2020 20:15   DG Chest Port 1 View  Result Date: 06/13/2020 CLINICAL DATA:  Weakness with recent COVID positive diagnosis. EXAM: PORTABLE CHEST 1 VIEW COMPARISON:  June 08, 2020 FINDINGS: There is no evidence of acute infiltrate, pleural effusion or pneumothorax. Mild, stable elevation of the right hemidiaphragm is seen. The heart size and mediastinal contours are within normal limits. Radiopaque surgical clips are seen overlying the right upper quadrant. The visualized skeletal structures are unremarkable. IMPRESSION: No active disease. Electronically Signed   By: Virgina Norfolk M.D.   On: 06/13/2020 19:30   DG Chest Port 1 View  Result Date: 06/08/2020 CLINICAL DATA:  COVID positive today.   Decreased appetite. EXAM: PORTABLE CHEST 1 VIEW COMPARISON:  01/13/2019 FINDINGS: Stable heart size and mediastinal contours. Mild chronic elevation of right hemidiaphragm. Subsegmental opacities in the left lung base. No confluent consolidation. No pulmonary edema, pleural effusion, or pneumothorax. Stable osseous structures. IMPRESSION: Subsegmental opacities in the left lung base, atelectasis versus pneumonia in the setting of COVID-19. Electronically Signed   By: Keith Rake M.D.   On: 06/08/2020 16:19    Microbiology: Recent Results (from the past 240 hour(s))  SARS Coronavirus 2 by RT PCR (hospital order, performed in [facility] hospital lab) Nasopharyngeal Nasopharyngeal Swab     Status: Abnormal   Collection Time: 06/08/20  1:01 PM   Specimen: Nasopharyngeal Swab  Result Value Ref Range Status   SARS Coronavirus 2 POSITIVE (A) NEGATIVE Final    Comment: CRITICAL RESULT CALLED TO, READ BACK BY AND VERIFIED WITH: ANNE TUTTLE,RN @1432  07/31/2021KAY (NOTE) SARS-CoV-2 target nucleic acids are DETECTED  SARS-CoV-2 RNA is generally detectable in upper respiratory specimens  during the acute phase of infection.  Positive results are indicative  of the presence of the identified virus, but do not rule out bacterial infection or co-infection  with other pathogens not detected by the test.  Clinical correlation with patient history and  other diagnostic information is necessary to determine patient infection status.  The expected result is negative.  Fact Sheet for Patients:   StrictlyIdeas.no   Fact Sheet for Healthcare Providers:   BankingDealers.co.za    This test is not yet approved or cleared by the [place] FDA and  has been authorized for detection and/or diagnosis of SARS-CoV-2 by FDA under an Emergency Use Authorization (EUA).  This EUA will remain in effect (meanin g this test can be used) for the duration of  the  COVID-19 declaration under Section 564(b)(1) of the Act, 21 U.S.C. section 360-bbb-3(b)(1), unless the authorization is terminated or revoked sooner.  Performed at [facility], [address] [place], [place] [address]   Blood Culture (routine x 2)     Status: None   Collection Time: 06/08/20  4:38 PM   Specimen: Right Antecubital; Blood  Result Value Ref Range Status   Specimen Description RIGHT ANTECUBITAL  Final   Special Requests   Final    BOTTLES DRAWN AEROBIC AND ANAEROBIC Blood Culture results may not be optimal due to an excessive volume of blood received in culture bottles   Culture   Final    NO GROWTH 5 DAYS Performed at [facility], [address] [place], [place] [address]    Report Status 06/13/2020 FINAL  Final  Blood Culture (routine x 2)     Status: None   Collection Time: 06/08/20  4:38 PM   Specimen: Left Antecubital; Blood  Result Value Ref Range Status   Specimen Description LEFT ANTECUBITAL  Final   Special Requests   Final    BOTTLES DRAWN AEROBIC AND ANAEROBIC Blood Culture results may not be optimal due to an inadequate volume of blood received in culture bottles   Culture   Final    NO GROWTH 5 DAYS Performed at [facility], [address] [place], [place] [address]    Report Status 06/13/2020 FINAL  Final     Labs: Basic Metabolic Panel: Recent Labs  Lab 06/13/20 1418 06/13/20 1915 06/14/20 0545 06/15/20 0840 06/16/20 0758  NA 139 137 136 140 138  K 2.9* 2.9* 3.3* 3.7 3.9  CL 92* 91* 97* 99 101  CO2 32 32 26 28 29   GLUCOSE 154* 154* 126* 149* 152*  BUN 10 18 14 16 18   CREATININE 0.93 1.01* 0.75 0.84 0.81  CALCIUM 9.6 9.4 8.8* 9.8 8.8*  MG  --  2.0  --   --  1.9  PHOS  --  3.7  --   --   --    Liver Function Tests: Recent Labs  Lab 06/13/20 1915 06/14/20 0545  AST 34 30  ALT 45* 39  ALKPHOS 60 55  BILITOT 1.0 0.8  PROT 7.1 6.4*  ALBUMIN 3.8 3.4*    Recent Labs  Lab 06/13/20 1915  AMMONIA 24   CBC: Recent Labs  Lab  06/13/20 1418 06/13/20 1915 06/14/20 0545 06/16/20 0758  WBC 9.4 9.1 9.9 13.7*  NEUTROABS  --  6.7  --   --   HGB 14.6 14.8 15.1* 12.4  HCT 45.0 45.6 47.4* 38.5  MCV 89.6 91.6 93.5 91.7  PLT 541* 513* 477* 454*   Signed:  Barton Dubois MD.  [facility] 06/17/2020, 2:35 PM

## 2020-06-17 NOTE — TOC Transition Note (Signed)
Transition of Care [facility]) - CM/SW Discharge Note   Patient Details  Name: Brenda Erickson MRN: [id number] Date of Birth: [birth year]  [facility] of Care [facility]) CM/SW Contact:  Salome Arnt, [place] Phone Number: 06/17/2020, 2:29 PM   Clinical Narrative:   Anticipate d/c later today per MD. Pt will return home with family support. No home health services recommended by PT at this time.     Final next level of care: Home/Self Care Barriers to Discharge: Barriers Resolved   Patient Goals and CMS Choice Patient states their goals for this hospitalization and ongoing recovery are:: return home      Discharge Placement                       Discharge Plan and Services In-house Referral: Clinical Social Work   Post Acute Care Choice: NA                               Social Determinants of Health (SDOH) Interventions     Readmission Risk Interventions No flowsheet data found.

## 2020-06-17 NOTE — Discharge Summary (Signed)
Physician Discharge Summary  Brenda Erickson XVQ:[id number] DOB: 06-15-1933 DOA: 06/13/2020  PCP: Perlie Mayo, NP  Admit date: 06/13/2020 Discharge date: 06/17/2020  Time spent: 35 minutes  Recommendations for Outpatient Follow-up:  1. Repeat basic metabolic panel to follow across renal function 2. Reassess blood pressure and further adjust antihypertensive regimen 3. Goals of care discussion recommended for advance directive form planning.   Discharge Diagnoses:  Principal Problem:   Nausea and vomiting Active Problems:   HTN (hypertension)   Hyperlipidemia LDL goal <70   Acquired autoimmune hypothyroidism   COVID-19   Hypokalemia   Thrombocytosis (HCC)   ALT (SGPT) level raised   Nausea & vomiting   Weakness   Discharge Condition: Stable and improved.  Discharged home with instruction to follow-up with PCP in 10 days  CODE STATUS: Full code.  Diet recommendation: Heart healthy diet.     Filed Weights   06/13/20 1823  Weight: 59 kg    History of present illness:  As per H&P written by Dr. Olevia Bowens on 06/13/20 86 y.o.femalewith medical history significant ofacquired trigger finger, osteoarthritis, mice the left kidney, constipation, diverticulosis, esophageal reflux, H. pylori gastritis, fibromyalgia, goiter, Hashimoto's disease, acquired hypothyroidism, hyperlipidemia, hypertension, urolithiasis, TIAwho lives alone, although her daughter lives across the street, who is brought to the emergency department due to 3 to 4 days of nausea and emesis and confusion. She is unable to provide further information, but is able to answer simple questions. She denies headache, chest pain, abdominal or back pain at this time.   ED Course:Initial vital signs weretemperature100.3 F, pulse 77, respiration 19, blood pressure 141/84 mmHg O2 sat 97% on room air. The patient received 650 mg of Tylenol and KCl 10 mEq IVPB x2.  CBC showed a white count of 9.1, hemoglobin 14.8  g/dL and platelets 513. CMP shows a potassium of 2.9 chloride 91 mmol/L. All other electrolytes are within normal range. Glucose 154, creatinine 1.01 mg/dL. ALT is slightly elevated at 45 units/L, but all other hepatic function tests are within expected range. Lactic acid, ammonia, phosphorus and magnesium were normal.  Hospital Course:  1-nausea and vomiting -No abdominal pain, no further nausea or vomiting.  Patient expressed some improvement in her ability for oral intake. -Instructed to maintain adequate hydration. -Continue the use of PPI BID and Carafate. -Most likely secondary to underlying history of Covid infection along with gastritis. -Started on Ensure and encourage to increase oral intake.  2-HTN (hypertension) -Stable and well-controlled: -Continue home antihypertensive regimen -Reassess blood pressure at follow-up visit with PCP and further adjust antihypertensive medications at that time -Patient advised to maintain adequate hydration. -Demadex dosage has been recommended to use on as-needed basis only; minimizing the chances for further electrolyte abnormalities and acute dehydration while patient able to maintain good oral intake.  3-Hyperlipidemia LDL goal <70 -Continue Crestor  4-Acquired autoimmune hypothyroidism -Continue Synthroid  5-Hypokalemia -Repleted and within normal limits -Vaccinations stable.  6-recent Covid infection -Patient completed remdesivir infusions and during this hospitalization has also completed the use of oral Decadron. -No fever, no chest pain, no nausea, no vomiting, no requiring oxygen supplementation and has remained afebrile. -Advised to maintain adequate hydration, to increase nutrition and to continue the use of as needed bronchodilators/antitussive medications. -Tylenol recommended to use as needed for comfort and symptom management.  7-physical deconditioning -Physical therapy has been consulted to assess/evaluate patient  and provide recommendations -Patient has been found close to her baseline and no requiring home health services currently. -She will  be discharged home with family care.  Procedures:  See below for x-ray reports  Consultations:  None  Discharge Exam:     Vitals:   06/17/20 0517 06/17/20 1350  BP: (!) 143/69 137/84  Pulse: 66 (!) 58  Resp: 16 16  Temp:  98 F (36.7 C)  SpO2: 93% 99%    General: No fever, no chest pain, no further episode of nausea/vomiting or complaints of abdominal pain.  Patient reports some improvement in her eating ability and would like to go home. Cardiovascular: S1 and S2, no rubs, no gallops, no JVD. Respiratory: Positive scattered rhonchi; no using accessory muscles, good oxygen saturation on room air, no wheezing, no crackles. Abdomen: Soft, nontender, nondistended, positive bowel sounds Extremities: No cyanosis, no clubbing, no edema.  Discharge Instructions       Discharge Instructions    Diet - low sodium heart healthy   Complete by: As directed    Discharge instructions   Complete by: As directed    Take medications as prescribed. Maintain adequate hydration and increased nutrition/oral intake. Arrange follow-up with PCP in 10 days. Follow heart healthy diet.          Allergies as of 06/17/2020      Reactions   Codeine Nausea And Vomiting   Other    NO BLOOD PRODUCTS         Medication List    STOP taking these medications   dexamethasone 6 MG tablet Commonly known as: DECADRON   omeprazole 20 MG capsule Commonly known as: PRILOSEC Replaced by: pantoprazole 40 MG tablet     TAKE these medications   albuterol 108 (90 Base) MCG/ACT inhaler Commonly known as: VENTOLIN HFA Inhale 1-2 puffs into the lungs every 6 (six) hours as needed for wheezing or shortness of breath.   amLODipine 10 MG tablet Commonly known as: NORVASC Take 0.5 tablets (5 mg total) by mouth daily. What changed: how much to  take   aspirin 81 MG chewable tablet Chew 81 mg by mouth in the morning.   cloNIDine 0.1 MG tablet Commonly known as: CATAPRES Take 1 tablet (0.1 mg total) by mouth at bedtime.   feeding supplement (ENSURE ENLIVE) Liqd Take 237 mLs by mouth 2 (two) times daily between meals.   ferrous sulfate 325 (65 FE) MG EC tablet Take 325 mg by mouth daily.   gabapentin 300 MG capsule Commonly known as: NEURONTIN Take 1 capsule (300 mg total) by mouth at bedtime.   guaiFENesin-dextromethorphan 100-10 MG/5ML syrup Commonly known as: ROBITUSSIN DM Take 5 mLs by mouth every 4 (four) hours as needed for cough.   levothyroxine 100 MCG tablet Commonly known as: SYNTHROID Take 100 mcg by mouth daily before breakfast.   metoprolol succinate 100 MG 24 hr tablet Commonly known as: TOPROL-XL Take 1 tablet (100 mg total) by mouth daily. Take with or immediately following a meal.   pantoprazole 40 MG tablet Commonly known as: PROTONIX Take 1 tablet (40 mg total) by mouth 2 (two) times daily. Replaces: omeprazole 20 MG capsule   rosuvastatin 10 MG tablet Commonly known as: CRESTOR Take 1 tablet (10 mg total) by mouth daily.   sodium chloride 0.65 % Soln nasal spray Commonly known as: OCEAN Place 1 spray into both nostrils as needed for congestion.   sucralfate 1 g tablet Commonly known as: Carafate Take 1 tablet (1 g total) by mouth 4 (four) times daily. What changed: when to take this   TEARS AGAIN OP Place 1  drop into both eyes daily as needed (for dry eye relief).   torsemide 20 MG tablet Commonly known as: DEMADEX Take 1 tablet (20 mg total) by mouth daily as needed (for edema and swelling.). Take 20 mg by mouth daily. What changed:   when to take this  reasons to take this   Vitamin D3 50 MCG (2000 UT) Tabs Take 1 tablet by mouth daily.           Allergies  Allergen Reactions  . Codeine Nausea And Vomiting  . Other     NO BLOOD PRODUCTS         Follow-up Information        Perlie Mayo, NP. Schedule an appointment as soon as possible for a visit in 10 day(s).   Specialty: Family Medicine Contact information: [address] [place] [address] [phone]                 The results of significant diagnostics from this hospitalization (including imaging, microbiology, ancillary and laboratory) are listed below for reference.    Significant Diagnostic Studies:  Imaging Results  CT Head Wo Contrast  Result Date: 06/13/2020 CLINICAL DATA:  Worsening fatigue with mental status change EXAM: CT HEAD WITHOUT CONTRAST TECHNIQUE: Contiguous axial images were obtained from the base of the skull through the vertex without intravenous contrast. COMPARISON:  MRI 01/28/2015 CT 01/27/2015 FINDINGS: Brain: No acute territorial infarction, hemorrhage, or intracranial mass. Mild atrophy. Patchy hypodensity in the white matter consistent with chronic small vessel ischemic change. Nonenlarged ventricles. Vascular: No hyperdense vessels.  Carotid vascular calcification Skull: Normal. Negative for fracture or focal lesion. Sinuses/Orbits: No acute finding. Other: None IMPRESSION: 1. No CT evidence for acute intracranial abnormality. 2. Atrophy and chronic small vessel ischemic changes of the white matter. Electronically Signed   By: Donavan Foil M.D.   On: 06/13/2020 20:15   DG Chest Port 1 View  Result Date: 06/13/2020 CLINICAL DATA:  Weakness with recent COVID positive diagnosis. EXAM: PORTABLE CHEST 1 VIEW COMPARISON:  June 08, 2020 FINDINGS: There is no evidence of acute infiltrate, pleural effusion or pneumothorax. Mild, stable elevation of the right hemidiaphragm is seen. The heart size and mediastinal contours are within normal limits. Radiopaque surgical clips are seen overlying the right upper quadrant. The visualized skeletal structures are unremarkable. IMPRESSION: No active disease. Electronically Signed   By: Virgina Norfolk M.D.   On: 06/13/2020 19:30   DG Chest Port 1 View  Result Date: 06/08/2020 CLINICAL DATA:  COVID positive today.  Decreased appetite. EXAM: PORTABLE CHEST 1 VIEW COMPARISON:  01/13/2019 FINDINGS: Stable heart size and mediastinal contours. Mild chronic elevation of right hemidiaphragm. Subsegmental opacities in the left lung base. No confluent consolidation. No pulmonary edema, pleural effusion, or pneumothorax. Stable osseous structures. IMPRESSION: Subsegmental opacities in the left lung base, atelectasis versus pneumonia in the setting of COVID-19. Electronically Signed   By: Keith Rake M.D.   On: 06/08/2020 16:19     Microbiology:        Recent Results (from the past 240 hour(s))  SARS Coronavirus 2 by RT PCR (hospital order, performed in [facility] hospital lab) Nasopharyngeal Nasopharyngeal Swab     Status: Abnormal   Collection Time: 06/08/20  1:01 PM   Specimen: Nasopharyngeal Swab  Result Value Ref Range Status   SARS Coronavirus 2 POSITIVE (A) NEGATIVE Final    Comment: CRITICAL RESULT CALLED TO, READ BACK BY AND VERIFIED WITH: ANNE TUTTLE,RN @1432  07/31/2021KAY (  NOTE) SARS-CoV-2 target nucleic acids are DETECTED  SARS-CoV-2 RNA is generally detectable in upper respiratory specimens  during the acute phase of infection.  Positive results are indicative  of the presence of the identified virus, but do not rule out bacterial infection or co-infection with other pathogens not detected by the test.  Clinical correlation with patient history and  other diagnostic information is necessary to determine patient infection status.  The expected result is negative.  Fact Sheet for Patients:   StrictlyIdeas.no              Fact Sheet for Healthcare Providers:   BankingDealers.co.za               This test is not yet approved or cleared by the [place] FDA and  has been authorized for detection and/or  diagnosis of SARS-CoV-2 by FDA under an Emergency Use Authorization (EUA).  This EUA will remain in effect (meanin g this test can be used) for the duration of  the COVID-19 declaration under Section 564(b)(1) of the Act, 21 U.S.C. section 360-bbb-3(b)(1), unless the authorization is terminated or revoked sooner.  Performed at [facility], [address], [place], [place] [address]   Blood Culture (routine x 2)     Status: None   Collection Time: 06/08/20  4:38 PM   Specimen: Right Antecubital; Blood  Result Value Ref Range Status   Specimen Description RIGHT ANTECUBITAL  Final   Special Requests   Final    BOTTLES DRAWN AEROBIC AND ANAEROBIC Blood Culture results may not be optimal due to an excessive volume of blood received in culture bottles   Culture   Final    NO GROWTH 5 DAYS Performed at [facility], [address], [place], [place] [address]    Report Status 06/13/2020 FINAL  Final  Blood Culture (routine x 2)     Status: None   Collection Time: 06/08/20  4:38 PM   Specimen: Left Antecubital; Blood  Result Value Ref Range Status   Specimen Description LEFT ANTECUBITAL  Final   Special Requests   Final    BOTTLES DRAWN AEROBIC AND ANAEROBIC Blood Culture results may not be optimal due to an inadequate volume of blood received in culture bottles   Culture   Final    NO GROWTH 5 DAYS Performed at [facility], [address], [place],  [address]    Report Status 06/13/2020 FINAL  Final     Labs: Basic Metabolic Panel: Last Labs          Recent Labs  Lab 06/13/20 1418 06/13/20 1915 06/14/20 0545 06/15/20 0840 06/16/20 0758  NA 139 137 136 140 138  K 2.9* 2.9* 3.3* 3.7 3.9  CL 92* 91* 97* 99 101  CO2 32 32 26 28 29   GLUCOSE 154* 154* 126* 149* 152*  BUN 10 18 14 16 18   CREATININE 0.93 1.01* 0.75 0.84 0.81  CALCIUM 9.6 9.4 8.8* 9.8 8.8*  MG  --  2.0  --   --  1.9  PHOS  --  3.7  --   --   --      Liver Function  Tests: Last Labs       Recent Labs  Lab 06/13/20 1915 06/14/20 0545  AST 34 30  ALT 45* 39  ALKPHOS 60 55  BILITOT 1.0 0.8  PROT 7.1 6.4*  ALBUMIN 3.8 3.4*      Last Labs      Recent Labs  Lab  06/13/20 1915  AMMONIA 24     CBC: Last Labs         Recent Labs  Lab 06/13/20 1418 06/13/20 1915 06/14/20 0545 06/16/20 0758  WBC 9.4 9.1 9.9 13.7*  NEUTROABS  --  6.7  --   --   HGB 14.6 14.8 15.1* 12.4  HCT 45.0 45.6 47.4* 38.5  MCV 89.6 91.6 93.5 91.7  PLT 541* 513* 477* 454*     Signed:  Barton Dubois MD.  [facility] 06/17/2020, 2:35 PM

## 2020-06-17 NOTE — Progress Notes (Signed)
Nsg Discharge Note  Admit Date:  06/13/2020 Discharge date: 06/17/2020   Bradd Canary to be D/C'd  per MD order.  AVS completed.  Patient's daughter Maudie Mercury able to verbalize understanding.  Discharge Medication: Allergies as of 06/17/2020      Reactions   Codeine Nausea And Vomiting   Other    NO BLOOD PRODUCTS      Medication List    STOP taking these medications   dexamethasone 6 MG tablet Commonly known as: DECADRON   omeprazole 20 MG capsule Commonly known as: PRILOSEC Replaced by: pantoprazole 40 MG tablet     TAKE these medications   albuterol 108 (90 Base) MCG/ACT inhaler Commonly known as: VENTOLIN HFA Inhale 1-2 puffs into the lungs every 6 (six) hours as needed for wheezing or shortness of breath.   amLODipine 10 MG tablet Commonly known as: NORVASC Take 0.5 tablets (5 mg total) by mouth daily. What changed: how much to take   aspirin 81 MG chewable tablet Chew 81 mg by mouth in the morning.   cloNIDine 0.1 MG tablet Commonly known as: CATAPRES Take 1 tablet (0.1 mg total) by mouth at bedtime.   feeding supplement (ENSURE ENLIVE) Liqd Take 237 mLs by mouth 2 (two) times daily between meals.   ferrous sulfate 325 (65 FE) MG EC tablet Take 325 mg by mouth daily.   gabapentin 300 MG capsule Commonly known as: NEURONTIN Take 1 capsule (300 mg total) by mouth at bedtime.   guaiFENesin-dextromethorphan 100-10 MG/5ML syrup Commonly known as: ROBITUSSIN DM Take 5 mLs by mouth every 4 (four) hours as needed for cough.   levothyroxine 100 MCG tablet Commonly known as: SYNTHROID Take 100 mcg by mouth daily before breakfast.   metoprolol succinate 100 MG 24 hr tablet Commonly known as: TOPROL-XL Take 1 tablet (100 mg total) by mouth daily. Take with or immediately following a meal.   pantoprazole 40 MG tablet Commonly known as: PROTONIX Take 1 tablet (40 mg total) by mouth 2 (two) times daily. Replaces: omeprazole 20 MG capsule   rosuvastatin 10 MG  tablet Commonly known as: CRESTOR Take 1 tablet (10 mg total) by mouth daily.   sodium chloride 0.65 % Soln nasal spray Commonly known as: OCEAN Place 1 spray into both nostrils as needed for congestion.   sucralfate 1 g tablet Commonly known as: Carafate Take 1 tablet (1 g total) by mouth 4 (four) times daily. What changed: when to take this   TEARS AGAIN OP Place 1 drop into both eyes daily as needed (for dry eye relief).   torsemide 20 MG tablet Commonly known as: DEMADEX Take 1 tablet (20 mg total) by mouth daily as needed (for edema and swelling.). Take 20 mg by mouth daily. What changed:   when to take this  reasons to take this   Vitamin D3 50 MCG (2000 UT) Tabs Take 1 tablet by mouth daily.       Discharge Assessment: Vitals:   06/17/20 0517 06/17/20 1350  BP: (!) 143/69 137/84  Pulse: 66 (!) 58  Resp: 16 16  Temp:  98 F (36.7 C)  SpO2: 93% 99%   Skin clean, dry and intact without evidence of skin break down, no evidence of skin tears noted. IV catheter discontinued intact. Site without signs and symptoms of complications - no redness or edema noted at insertion site, patient denies c/o pain - only slight tenderness at site.  Dressing with slight pressure applied.  D/c Instructions-Education: Discharge instructions given  to patient's daughter Maudie Mercury with verbalized understanding. D/c education completed with patient's family including follow up instructions, medication list, d/c activities limitations if indicated, with other d/c instructions as indicated by MD - patient able to verbalize understanding, all questions fully answered. Patient instructed to return to ED, call 911, or call MD for any changes in condition.  Patient escorted via [place], and D/C home via private auto.  Jeri Rawlins Loletha Grayer, RN 06/17/2020 3:03 PM

## 2020-06-19 ENCOUNTER — Telehealth: Payer: Self-pay

## 2020-06-19 NOTE — Telephone Encounter (Signed)
Transition Care Management Follow-up Telephone Call   Date discharged?  06/17/20              How have you been since you were released from the hospital? altered mental status caregiver is answering questions for her   Do you understand why you were in the hospital? Covid   Do you understand the discharge instructions? Yes, trying to get [facility] for her   Where were you discharged to? home   Items Reviewed:  Medications reviewed: yes, trying to make sure pt is taking medications as directed  Allergies reviewed: yes  Dietary changes reviewed:yes   Referrals reviewed: no new referrals   Functional Questionnaire:   Activities of Daily Living (ADLs):  has help if needed    Any transportation issues/concerns?: no   Any patient concerns? yes, patient needs HH   Confirmed importance and date/time of follow-up visits scheduled cant come in the office due to Covid dx     Confirmed with patient if condition begins to worsen call PCP or go to the ER.  Patient was given the office number and encouraged to call back with question or concerns. Yes w/ verbal understanding.

## 2020-06-19 NOTE — Telephone Encounter (Signed)
Patients daughter isn't with her mother. She will go to her home shortly. Was advised to speak with daughter due to patients altered mental status. Will try back shortly

## 2020-06-20 ENCOUNTER — Other Ambulatory Visit: Payer: Self-pay | Admitting: *Deleted

## 2020-06-20 ENCOUNTER — Telehealth: Payer: Self-pay

## 2020-06-20 MED ORDER — LEVOTHYROXINE SODIUM 100 MCG PO TABS: 100.0000 ug | ORAL_TABLET | Freq: Every day | ORAL | 0 refills | Status: DC

## 2020-06-20 NOTE — Telephone Encounter (Signed)
This medication was sent to pt pharmacy this am

## 2020-06-20 NOTE — Telephone Encounter (Signed)
Pt needing refill on Levothroxine 0.100 mg

## 2020-07-16 ENCOUNTER — Telehealth (INDEPENDENT_AMBULATORY_CARE_PROVIDER_SITE_OTHER): Payer: Medicare Other | Admitting: Family Medicine

## 2020-07-16 ENCOUNTER — Other Ambulatory Visit: Payer: Self-pay

## 2020-07-16 ENCOUNTER — Other Ambulatory Visit: Payer: Self-pay | Admitting: *Deleted

## 2020-07-16 ENCOUNTER — Encounter: Payer: Self-pay | Admitting: Family Medicine

## 2020-07-16 VITALS — BP 136/58 | Ht 64.0 in | Wt 132.0 lb

## 2020-07-16 DIAGNOSIS — R6889 Other general symptoms and signs: Secondary | ICD-10-CM | POA: Diagnosis not present

## 2020-07-16 DIAGNOSIS — Z8616 Personal history of COVID-19: Secondary | ICD-10-CM | POA: Diagnosis not present

## 2020-07-16 DIAGNOSIS — I10 Essential (primary) hypertension: Secondary | ICD-10-CM

## 2020-07-16 MED ORDER — LEVOTHYROXINE SODIUM 100 MCG PO TABS: 100.0000 ug | ORAL_TABLET | Freq: Every day | ORAL | 0 refills | Status: AC

## 2020-07-16 MED ORDER — GABAPENTIN 300 MG PO CAPS: 300.0000 mg | ORAL_CAPSULE | Freq: Every day | ORAL | 5 refills | Status: DC

## 2020-07-16 MED ORDER — CLONIDINE HCL 0.1 MG PO TABS: 0.1000 mg | ORAL_TABLET | Freq: Every day | ORAL | 1 refills | Status: DC

## 2020-07-16 NOTE — Progress Notes (Signed)
Virtual Visit via Telephone Note   This visit type was conducted due to national recommendations for restrictions regarding the COVID-19 Pandemic (e.g. social distancing) in an effort to limit this patient's exposure and mitigate transmission in our community.  Due to her co-morbid illnesses, this patient is at least at moderate risk for complications without adequate follow up.  This format is felt to be most appropriate for this patient at this time.  The patient did not have access to video technology/had technical difficulties with video requiring transitioning to audio format only (telephone).  All issues noted in this document were discussed and addressed.  No physical exam could be performed with this format.    Evaluation Performed:  Follow-up visit  Date:  07/16/2020   ID:  Brenda Erickson, DOB [birth year], MRN [id number]  Patient Location: Home Provider Location: Office/Clinic  Location of Patient: Home Location of Provider: Telehealth Consent was obtain for visit to be over via telehealth. I verified that I am speaking with the correct person using two identifiers.  PCP:  Perlie Mayo, NP   Chief Complaint: Covid infection and overall help  History of Present Illness:    Brenda Erickson is a 86 y.o. female with with history of recent Covid infection requiring hospitalization, arthritis, hypertension, Hashimoto's, hypothyroid, hyperlipidemia among others.  Today she reports that she still does not sleep very well.  But this is not changed from when she was younger.  She reports her appetite is still not back her was since having Covid infection earlier in August.  She reports this up and down the pain on the day.  She denies any trouble going to the bathroom making water or bowel movements reports no blood in urine or stool.  Reports that her forgetfulness is gotten worse since having Covid.  She just cannot seem to remember certain things.  She denies having any falls.  She  reports recent skin hives secondary to eating shrimp.  But it went away.  Denies having any changes in hearing or vision.  She denies having any cough, fevers, chills, chest pain, shortness of breath, dizziness.  Reports headache every now and then.  Reports taking her medicines as directed and without any issue.  The patient does not have symptoms concerning for COVID-19 infection (fever, chills, cough, or new shortness of breath).   Past Medical, Surgical, Social History, Allergies, and Medications have been Reviewed.  Past Medical History:  Diagnosis Date  . Acquired trigger finger 05/25/2012  . Arthritis   . Cancer ([place])    mass on left kidney  . Change in bowel habits 09/21/2012  . CONSTIPATION 04/22/2009   Qualifier: Diagnosis of  By: Christ Kick    . Diverticulosis   . DIVERTICULOSIS, COLON 04/22/2009   Qualifier: Diagnosis of  By: Christ Kick    . EPIGASTRIC PAIN 04/22/2009   Qualifier: Diagnosis of  By: Christ Kick    . Esophageal reflux   . Fibromyalgia   . GOITER 04/22/2009   Qualifier: History of  By: Christ Kick    . Hashimoto's disease   . Hashimoto's disease   . Helicobacter pylori gastritis    in remote past, s/p treatment   . HELICOBACTER PYLORI GASTRITIS, HX OF 04/22/2009   Qualifier: Diagnosis of  By: Christ Kick    . HIP PAIN, RIGHT 04/22/2009   Qualifier: Diagnosis of  By: Christ Kick    . History of gastroesophageal reflux (GERD) 05/16/2013  . HTN (hypertension)   .  Hyperlipidemia LDL goal <70 01/29/2015  . Hypothyroid   . Hypothyroidism 04/22/2009   Qualifier: Diagnosis of  By: Christ Kick    . Kidney stones   . Low back pain radiating to both legs 05/16/2013  . Myalgia and myositis 04/22/2009   Qualifier: Diagnosis of  By: Christ Kick    . Renal mass, left 05/02/2013  . RLQ abdominal tenderness 05/16/2014  . RLQ abdominal tenderness 05/16/2014  . TIA (transient ischemic attack) 01/27/2015  . TRIGGER FINGER 02/11/2009    Qualifier: Diagnosis of  By: Aline Brochure MD, Dorothyann Peng    . TRIGGER FINGER DEFORMITY 06/25/2010   Qualifier: Diagnosis of  By: Aline Brochure MD, Dorothyann Peng     Past Surgical History:  Procedure Laterality Date  . ABDOMINAL HYSTERECTOMY     tumor removal; benign  . BLADDER SURGERY    . CHOLECYSTECTOMY    . COLONOSCOPY  05/22/2003   UQJ:[id number] rectum/ Left-sided diverticula.  The remainder of the colonic mucosa and terminal  ileum appeared normal  . COLONOSCOPY  10/05/2012   YBW:LSLHTDS diverticulosis. Tubular adenomas   . COLONOSCOPY N/A 12/08/2017   Procedure: COLONOSCOPY;  Surgeon: Daneil Dolin, MD;  Location: [facility];  Service: Endoscopy;  Laterality: N/A;  8:30  . ESOPHAGOGASTRODUODENOSCOPY  05/22/2003   RMR: Tiny distal esophageal erosions consistent with mild erosive reflux esophagitisOtherwise, normal upper gastrointestinal tract through the second part of   the duodenum  . ESOPHAGOGASTRODUODENOSCOPY  07/22/1992   RMR: Normal EGD  . ROBOTIC ASSITED PARTIAL NEPHRECTOMY     left. States was cancerous.   Marland Kitchen TOTAL HIP ARTHROPLASTY       Current Meds  Medication Sig  . albuterol (VENTOLIN HFA) 108 (90 Base) MCG/ACT inhaler Inhale 1-2 puffs into the lungs every 6 (six) hours as needed for wheezing or shortness of breath.  Marland Kitchen amLODipine (NORVASC) 10 MG tablet Take 0.5 tablets (5 mg total) by mouth daily.  . Artificial Tear Solution (TEARS AGAIN OP) Place 1 drop into both eyes daily as needed (for dry eye relief).   Marland Kitchen aspirin 81 MG chewable tablet Chew 81 mg by mouth in the morning.   . Cholecalciferol (VITAMIN D3) 2000 UNITS TABS Take 1 tablet by mouth daily.  . feeding supplement, ENSURE ENLIVE, (ENSURE ENLIVE) LIQD Take 237 mLs by mouth 2 (two) times daily between meals.  . ferrous sulfate 325 (65 FE) MG EC tablet Take 325 mg by mouth daily.  . metoprolol succinate (TOPROL-XL) 100 MG 24 hr tablet Take 1 tablet (100 mg total) by mouth daily. Take with or immediately following a meal.  .  pantoprazole (PROTONIX) 40 MG tablet Take 1 tablet (40 mg total) by mouth 2 (two) times daily.  . rosuvastatin (CRESTOR) 10 MG tablet Take 1 tablet (10 mg total) by mouth daily.  . sodium chloride (OCEAN) 0.65 % SOLN nasal spray Place 1 spray into both nostrils as needed for congestion.  . sucralfate (CARAFATE) 1 g tablet Take 1 tablet (1 g total) by mouth 4 (four) times daily. (Patient taking differently: Take 1 g by mouth daily. )  . torsemide (DEMADEX) 20 MG tablet Take 1 tablet (20 mg total) by mouth daily as needed (for edema and swelling.). Take 20 mg by mouth daily.  . [DISCONTINUED] cloNIDine (CATAPRES) 0.1 MG tablet Take 1 tablet (0.1 mg total) by mouth at bedtime.  . [DISCONTINUED] gabapentin (NEURONTIN) 300 MG capsule Take 1 capsule (300 mg total) by mouth at bedtime.  . [DISCONTINUED] levothyroxine (SYNTHROID) 100 MCG tablet Take  1 tablet (100 mcg total) by mouth daily before breakfast.     Allergies:   Codeine and Other   ROS:   Please see the history of present illness.    All other systems reviewed and are negative.   Labs/Other Tests and Data Reviewed:    Recent Labs: 03/19/2020: TSH 1.11 06/14/2020: ALT 39 06/16/2020: BUN 18; Creatinine, Ser 0.81; Hemoglobin 12.4; Magnesium 1.9; Platelets 454; Potassium 3.9; Sodium 138   Recent Lipid Panel Lab Results  Component Value Date/Time   CHOL 158 03/19/2020 10:24 AM   TRIG 100 06/08/2020 12:43 PM   HDL 56 03/19/2020 10:24 AM   CHOLHDL 2.8 03/19/2020 10:24 AM   LDLCALC 78 03/19/2020 10:24 AM    Wt Readings from Last 3 Encounters:  07/16/20 132 lb (59.9 kg)  06/13/20 130 lb (59 kg)  06/13/20 132 lb 4.4 oz (60 kg)     Objective:    Vital Signs:  BP (!) 136/58   Ht 5\' 4"  (1.626 m)   Wt 132 lb (59.9 kg)   BMI 22.66 kg/m    VITAL SIGNS:  reviewed GEN:  Alert and oriented RESPIRATORY:  no shortness of breath in conversation PSYCH:  Normal affect and mood  ASSESSMENT & PLAN:   1. History of COVID-19  2.  Forgetfulness   3. Essential hypertension    Time:   Today, I have spent 7 minutes with the patient with telehealth technology discussing the above problems.     Medication Adjustments/Labs and Tests Ordered: Current medicines are reviewed at length with the patient today.  Concerns regarding medicines are outlined above.   Tests Ordered: No orders of the defined types were placed in this encounter.   Medication Changes: No orders of the defined types were placed in this encounter.   Disposition:  Follow up 3 months  Signed, Perlie Mayo, NP  07/16/2020 2:32 PM     [facility]

## 2020-07-16 NOTE — Patient Instructions (Addendum)
I appreciate the opportunity to provide you with care for your health and wellness. Today we discussed: recent COVID infection   Follow up: Needs Flu shot appt, and 3 month appt  No labs or referrals today  Glad you are feeling better little by little. I hope it continues.   Please continue to practice social distancing to keep you, your family, and our community safe.  If you must go out, please wear a mask and practice good handwashing.  It was a pleasure to see you and I look forward to continuing to work together on your health and well-being. Please do not hesitate to call the office if you need care or have questions about your care.  Have a wonderful day and week. With Gratitude, Cherly Beach, DNP, AGNP-BC

## 2020-07-16 NOTE — Assessment & Plan Note (Addendum)
Recent Covid infection.  Overall doing well and has recovered. She is encouraged to get the vaccine 45 days after her illness.

## 2020-07-16 NOTE — Assessment & Plan Note (Signed)
Brenda Erickson is encouraged to maintain a well balanced diet that is low in salt. Controlled, continue current medication regimen.  Additionally, she is also reminded that exercise is beneficial for heart health and control of  Blood pressure. 30-60 minutes daily is recommended-walking was suggested.

## 2020-07-16 NOTE — Assessment & Plan Note (Addendum)
Her MMSE was 30 when checked in Feb. she reports continued forgetfulness.  That is gotten worse over the months.  Additionally worse since having Covid.  She does not report losing things.  Her getting lost in a parking lot.  But she just reports that she is having extensive trouble remembering certain things.  We will follow up on this at her next appointment.  She reports sometimes she has just trouble focusing.  And that could be part of the problem is that she just has a significant amount of inattention.

## 2020-07-31 DIAGNOSIS — K219 Gastro-esophageal reflux disease without esophagitis: Secondary | ICD-10-CM | POA: Diagnosis not present

## 2020-07-31 DIAGNOSIS — E785 Hyperlipidemia, unspecified: Secondary | ICD-10-CM | POA: Diagnosis not present

## 2020-07-31 DIAGNOSIS — I25119 Atherosclerotic heart disease of native coronary artery with unspecified angina pectoris: Secondary | ICD-10-CM | POA: Diagnosis not present

## 2020-07-31 DIAGNOSIS — I10 Essential (primary) hypertension: Secondary | ICD-10-CM | POA: Diagnosis not present

## 2020-07-31 DIAGNOSIS — E039 Hypothyroidism, unspecified: Secondary | ICD-10-CM | POA: Diagnosis not present

## 2020-08-02 ENCOUNTER — Ambulatory Visit: Payer: Medicare Other

## 2020-08-05 ENCOUNTER — Other Ambulatory Visit (HOSPITAL_COMMUNITY): Payer: Self-pay | Admitting: Internal Medicine

## 2020-08-05 DIAGNOSIS — Z1231 Encounter for screening mammogram for malignant neoplasm of breast: Secondary | ICD-10-CM

## 2020-08-08 DIAGNOSIS — I10 Essential (primary) hypertension: Secondary | ICD-10-CM | POA: Diagnosis not present

## 2020-08-08 DIAGNOSIS — R7301 Impaired fasting glucose: Secondary | ICD-10-CM | POA: Diagnosis not present

## 2020-08-08 DIAGNOSIS — Z0000 Encounter for general adult medical examination without abnormal findings: Secondary | ICD-10-CM | POA: Diagnosis not present

## 2020-08-08 DIAGNOSIS — E785 Hyperlipidemia, unspecified: Secondary | ICD-10-CM | POA: Diagnosis not present

## 2020-08-08 DIAGNOSIS — E039 Hypothyroidism, unspecified: Secondary | ICD-10-CM | POA: Diagnosis not present

## 2020-08-13 DIAGNOSIS — Z0001 Encounter for general adult medical examination with abnormal findings: Secondary | ICD-10-CM | POA: Diagnosis not present

## 2020-08-13 DIAGNOSIS — K219 Gastro-esophageal reflux disease without esophagitis: Secondary | ICD-10-CM | POA: Diagnosis not present

## 2020-09-05 ENCOUNTER — Ambulatory Visit (HOSPITAL_COMMUNITY)
Admission: RE | Admit: 2020-09-05 | Discharge: 2020-09-05 | Disposition: A | Discharge status: Home / Self Care | Payer: Medicare Other | Source: Ambulatory Visit | Attending: Internal Medicine | Admitting: Internal Medicine

## 2020-09-05 ENCOUNTER — Other Ambulatory Visit: Payer: Self-pay

## 2020-09-05 DIAGNOSIS — Z1231 Encounter for screening mammogram for malignant neoplasm of breast: Secondary | ICD-10-CM | POA: Diagnosis not present

## 2020-10-14 DIAGNOSIS — Z23 Encounter for immunization: Secondary | ICD-10-CM | POA: Diagnosis not present

## 2020-10-16 ENCOUNTER — Ambulatory Visit: Payer: Medicare Other | Admitting: Family Medicine

## 2020-11-02 IMAGING — MG DIGITAL SCREENING BILAT W/ TOMO
8 series · 9 of 24 positions shown · non-contrast
Comparison: Previous exam(s).

CLINICAL DATA: Screening.

EXAM:
DIGITAL SCREENING BILATERAL MAMMOGRAM WITH TOMO AND CAD

[L MLO synth-2D]
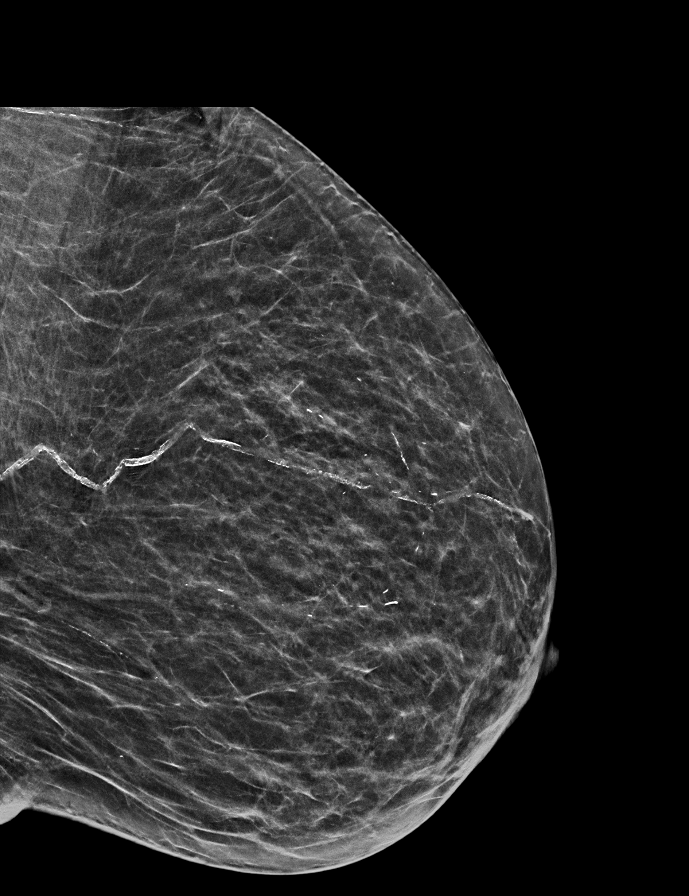

[R CC synth-2D]
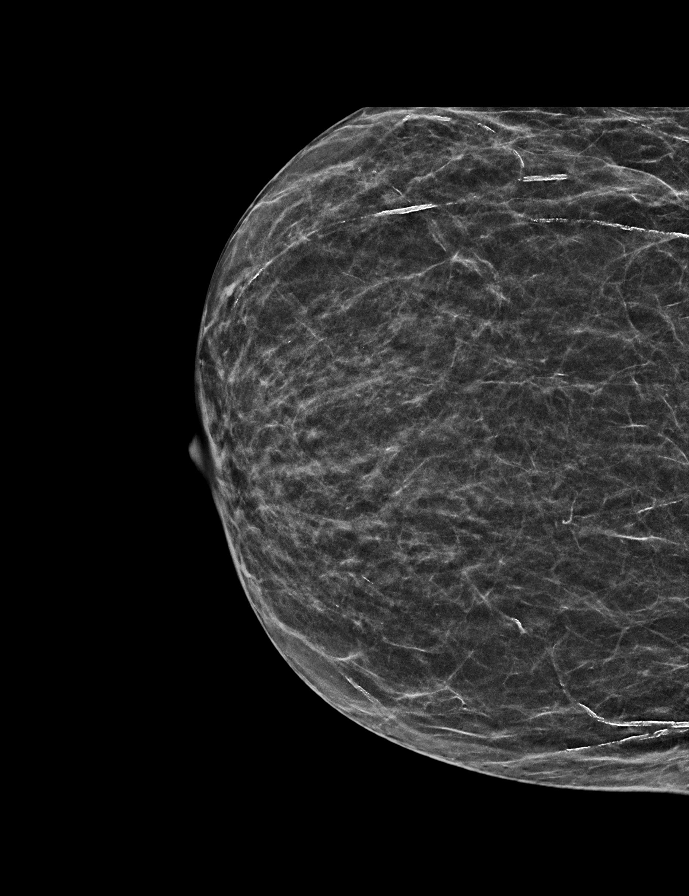

[R MLO synth-2D]
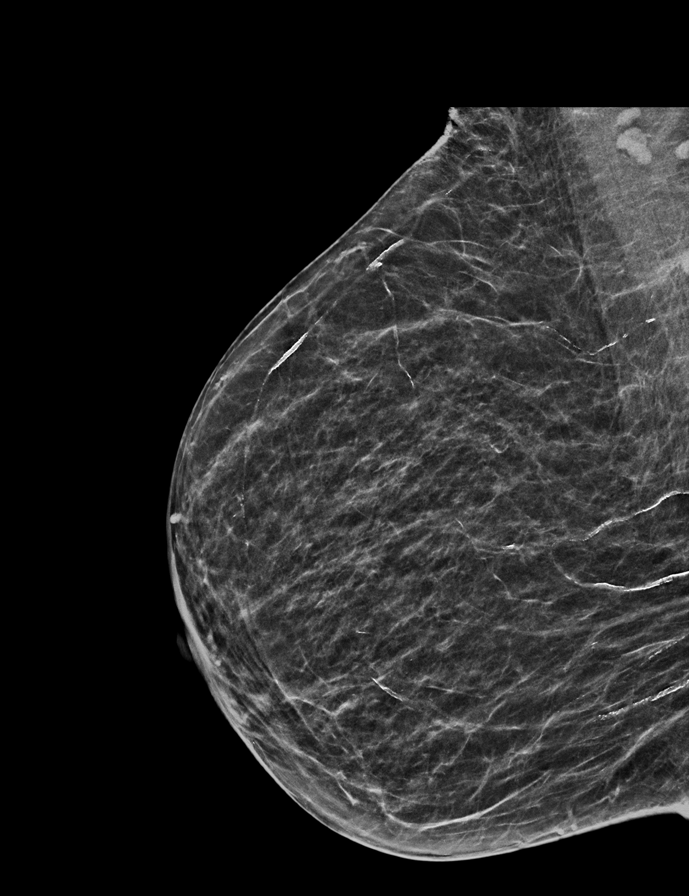

[L CC synth-2D]
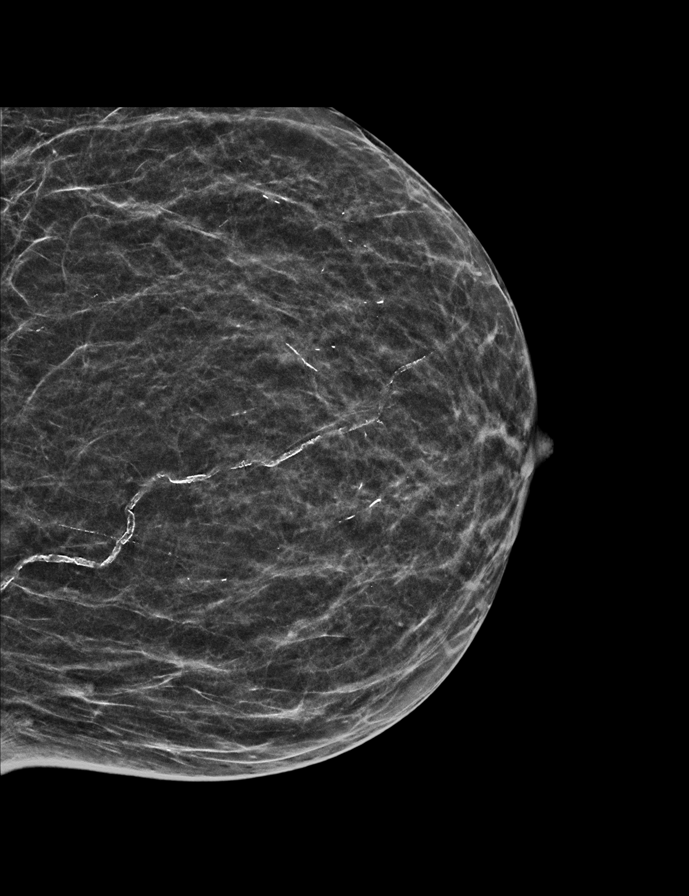

[R CC tomo · 2 of 44 frames shown]
[frame 15/44]
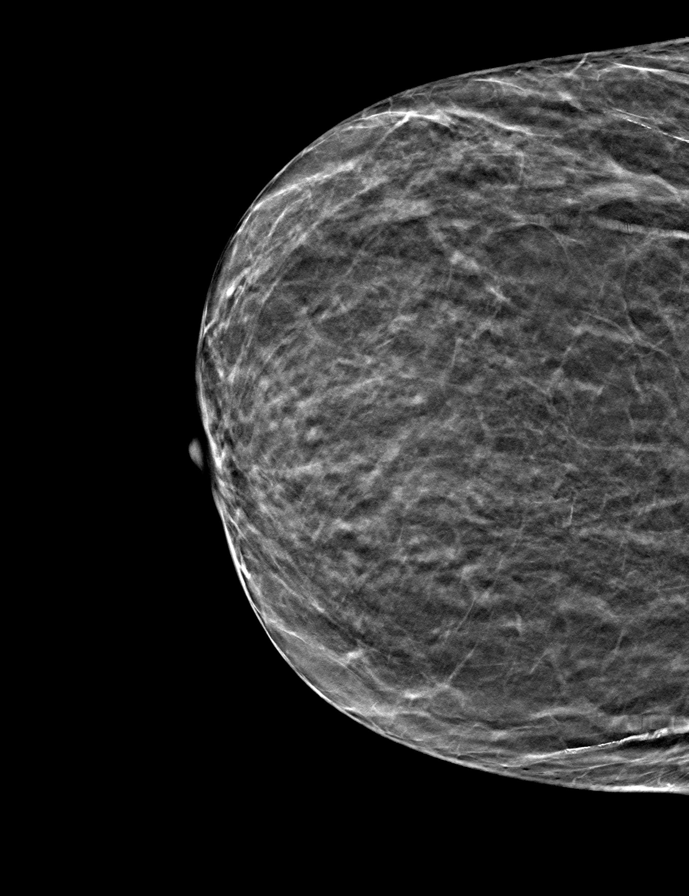
[frame 23/44]
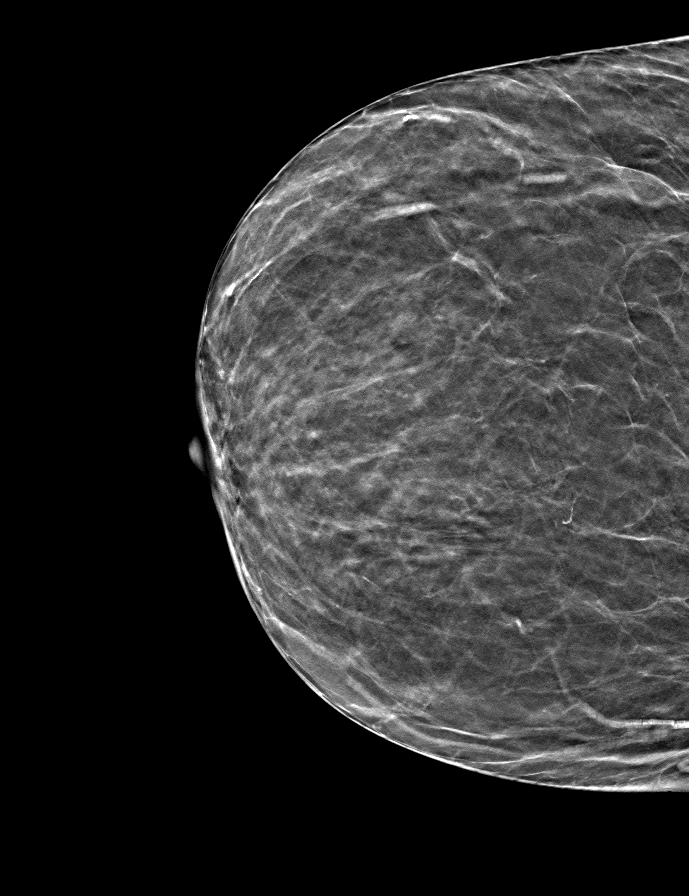

[L MLO tomo · tomo slice 25/50.0]
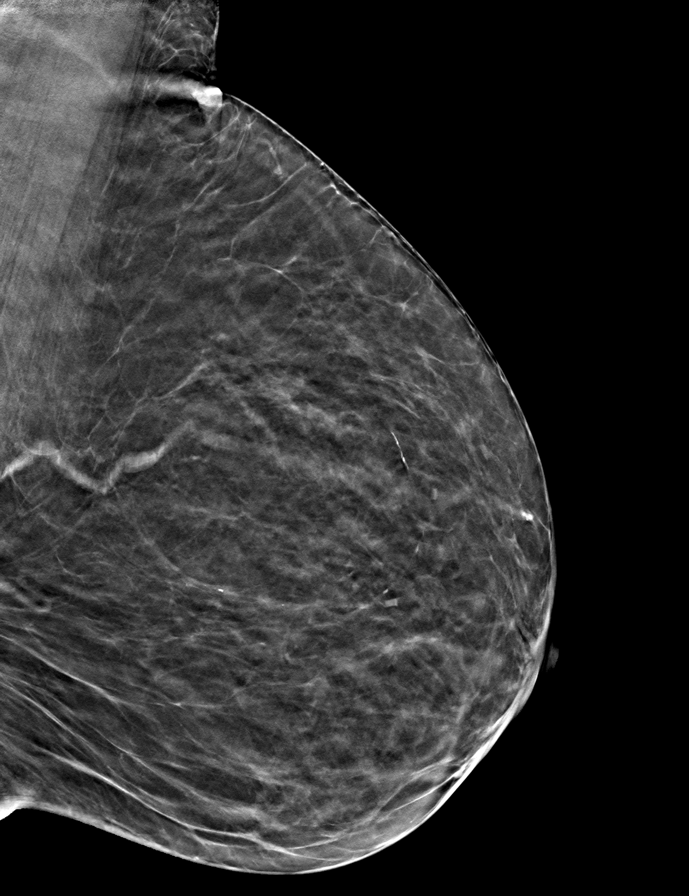

[R MLO tomo · tomo slice 25/49.0]
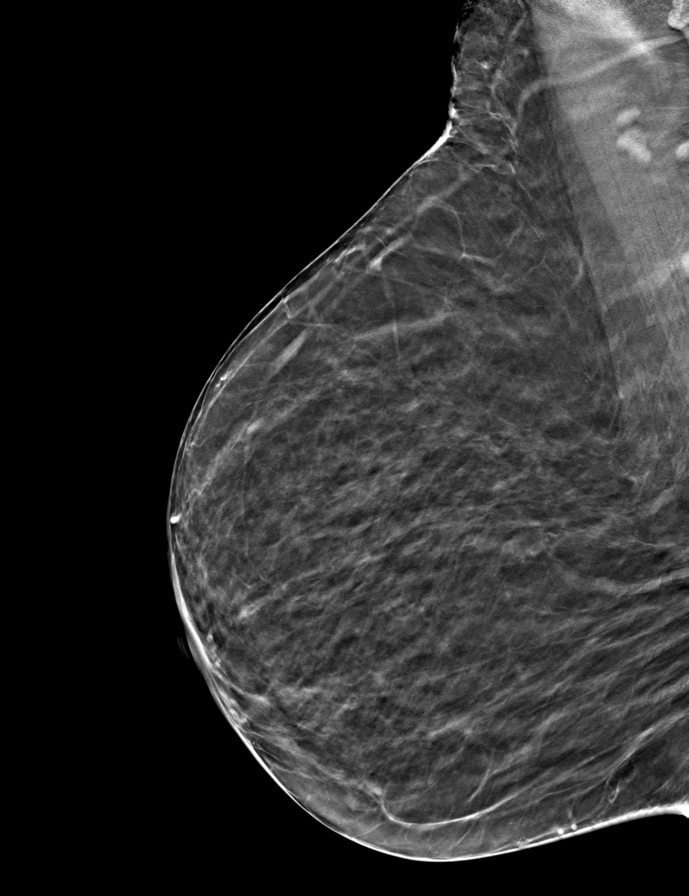

[L CC tomo · tomo slice 23/45.0]
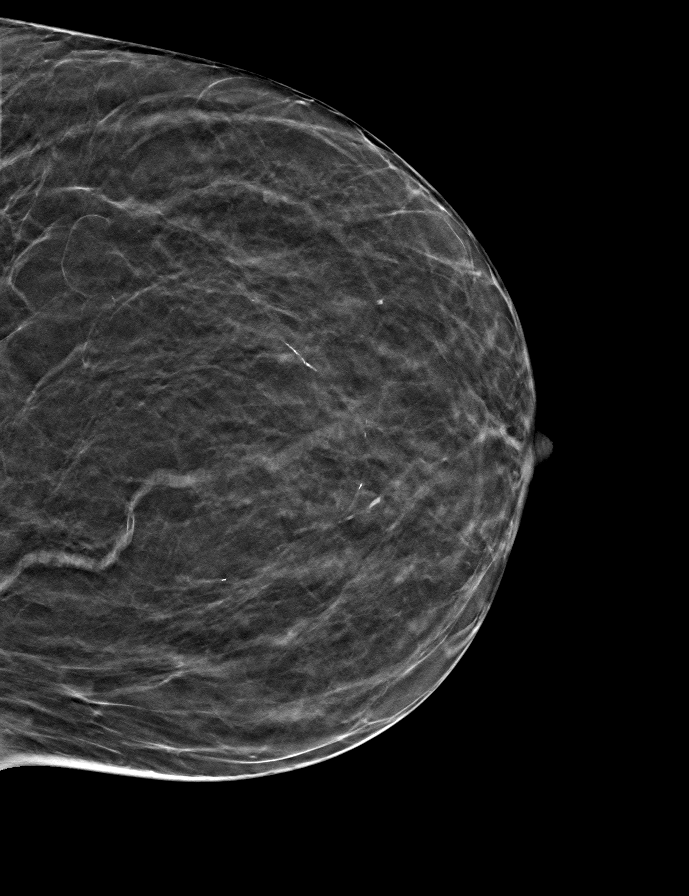

[9 of 24 positions shown; findings below may reference images not displayed]

ACR Breast Density Category b: There are scattered areas of
fibroglandular density.
FINDINGS: There are no findings suspicious for malignancy. Images were
processed with CAD.
IMPRESSION: No mammographic evidence of malignancy. A result letter of this
screening mammogram will be mailed directly to the patient.

RECOMMENDATION:
Screening mammogram in one year. (Code:[id number])

BI-RADS CATEGORY  1: Negative.

## 2021-01-15 ENCOUNTER — Other Ambulatory Visit: Payer: Self-pay

## 2021-01-15 MED ORDER — METOPROLOL SUCCINATE ER 100 MG PO TB24: 100.0000 mg | ORAL_TABLET | Freq: Every day | ORAL | 1 refills | Status: AC

## 2021-02-10 ENCOUNTER — Other Ambulatory Visit: Payer: Self-pay

## 2021-02-10 MED ORDER — AMLODIPINE BESYLATE 10 MG PO TABS: 5.0000 mg | ORAL_TABLET | Freq: Every day | ORAL | Status: AC

## 2021-04-09 DIAGNOSIS — R7303 Prediabetes: Secondary | ICD-10-CM | POA: Insufficient documentation

## 2021-05-27 DIAGNOSIS — R7303 Prediabetes: Secondary | ICD-10-CM | POA: Diagnosis not present

## 2021-05-27 DIAGNOSIS — E785 Hyperlipidemia, unspecified: Secondary | ICD-10-CM | POA: Diagnosis not present

## 2021-05-27 DIAGNOSIS — I10 Essential (primary) hypertension: Secondary | ICD-10-CM | POA: Diagnosis not present

## 2021-06-02 DIAGNOSIS — I2510 Atherosclerotic heart disease of native coronary artery without angina pectoris: Secondary | ICD-10-CM | POA: Insufficient documentation

## 2021-06-04 DIAGNOSIS — Z0001 Encounter for general adult medical examination with abnormal findings: Secondary | ICD-10-CM | POA: Diagnosis not present

## 2021-06-04 DIAGNOSIS — I2510 Atherosclerotic heart disease of native coronary artery without angina pectoris: Secondary | ICD-10-CM | POA: Diagnosis not present

## 2021-06-04 DIAGNOSIS — M545 Low back pain, unspecified: Secondary | ICD-10-CM | POA: Diagnosis not present

## 2021-06-04 DIAGNOSIS — I10 Essential (primary) hypertension: Secondary | ICD-10-CM | POA: Diagnosis not present

## 2021-06-04 DIAGNOSIS — Z0000 Encounter for general adult medical examination without abnormal findings: Secondary | ICD-10-CM | POA: Diagnosis not present

## 2021-06-04 DIAGNOSIS — E785 Hyperlipidemia, unspecified: Secondary | ICD-10-CM | POA: Diagnosis not present

## 2021-06-04 DIAGNOSIS — K219 Gastro-esophageal reflux disease without esophagitis: Secondary | ICD-10-CM | POA: Diagnosis not present

## 2021-06-04 DIAGNOSIS — F5101 Primary insomnia: Secondary | ICD-10-CM | POA: Diagnosis not present

## 2021-06-04 DIAGNOSIS — R7303 Prediabetes: Secondary | ICD-10-CM | POA: Diagnosis not present

## 2021-06-04 DIAGNOSIS — E039 Hypothyroidism, unspecified: Secondary | ICD-10-CM | POA: Diagnosis not present

## 2021-08-05 ENCOUNTER — Ambulatory Visit (HOSPITAL_COMMUNITY): Payer: Medicare Other

## 2021-08-05 ENCOUNTER — Other Ambulatory Visit (HOSPITAL_COMMUNITY): Payer: Self-pay | Admitting: Internal Medicine

## 2021-08-05 DIAGNOSIS — Z1231 Encounter for screening mammogram for malignant neoplasm of breast: Secondary | ICD-10-CM

## 2021-08-12 IMAGING — DX DG CHEST 1V PORT
1 series · 1 of 1 positions shown · non-contrast
Comparison: June 08, 2020

CLINICAL DATA: Weakness with recent COVID positive diagnosis.

EXAM:
PORTABLE CHEST 1 VIEW

[chest ap]
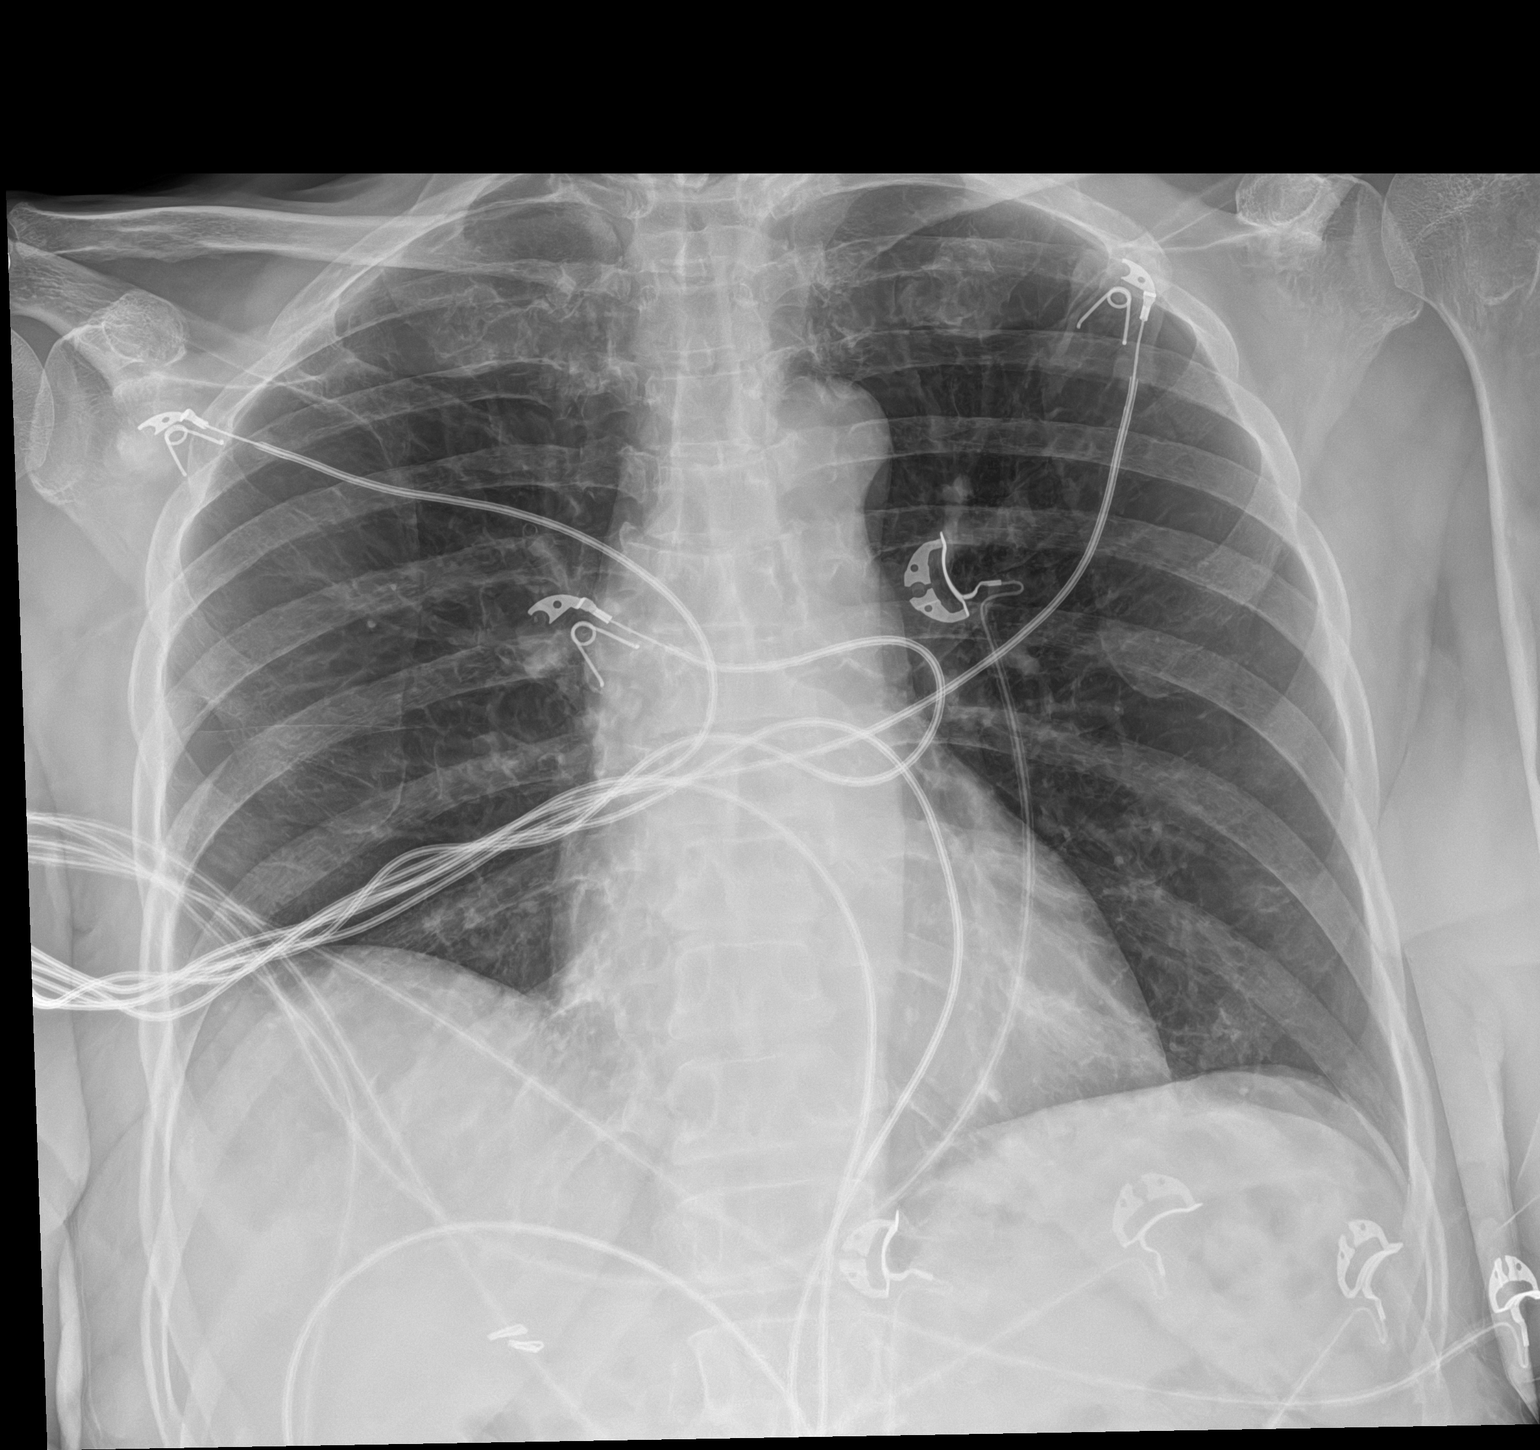

[1 of 1 positions shown; findings below may reference images not displayed]

FINDINGS: There is no evidence of acute infiltrate, pleural effusion or
pneumothorax. Mild, stable elevation of the right hemidiaphragm is
seen. The heart size and mediastinal contours are within normal
limits. Radiopaque surgical clips are seen overlying the right upper
quadrant. The visualized skeletal structures are unremarkable.
IMPRESSION: No active disease.

## 2021-08-29 DIAGNOSIS — R7309 Other abnormal glucose: Secondary | ICD-10-CM | POA: Diagnosis not present

## 2021-08-29 DIAGNOSIS — I10 Essential (primary) hypertension: Secondary | ICD-10-CM | POA: Diagnosis not present

## 2021-09-04 DIAGNOSIS — Z23 Encounter for immunization: Secondary | ICD-10-CM | POA: Diagnosis not present

## 2021-09-04 DIAGNOSIS — F5101 Primary insomnia: Secondary | ICD-10-CM | POA: Diagnosis not present

## 2021-09-04 DIAGNOSIS — R7303 Prediabetes: Secondary | ICD-10-CM | POA: Diagnosis not present

## 2021-09-04 DIAGNOSIS — I10 Essential (primary) hypertension: Secondary | ICD-10-CM | POA: Diagnosis not present

## 2021-09-04 DIAGNOSIS — K219 Gastro-esophageal reflux disease without esophagitis: Secondary | ICD-10-CM | POA: Diagnosis not present

## 2021-09-04 DIAGNOSIS — M545 Low back pain, unspecified: Secondary | ICD-10-CM | POA: Diagnosis not present

## 2021-09-04 DIAGNOSIS — E785 Hyperlipidemia, unspecified: Secondary | ICD-10-CM | POA: Diagnosis not present

## 2021-09-04 DIAGNOSIS — E039 Hypothyroidism, unspecified: Secondary | ICD-10-CM | POA: Diagnosis not present

## 2021-09-04 DIAGNOSIS — R7401 Elevation of levels of liver transaminase levels: Secondary | ICD-10-CM | POA: Diagnosis not present

## 2021-09-04 DIAGNOSIS — I2510 Atherosclerotic heart disease of native coronary artery without angina pectoris: Secondary | ICD-10-CM | POA: Diagnosis not present

## 2021-09-12 ENCOUNTER — Ambulatory Visit (HOSPITAL_COMMUNITY)
Admission: RE | Admit: 2021-09-12 | Discharge: 2021-09-12 | Disposition: A | Discharge status: Home / Self Care | Payer: Medicare Other | Source: Ambulatory Visit | Attending: Internal Medicine | Admitting: Internal Medicine

## 2021-09-12 ENCOUNTER — Other Ambulatory Visit: Payer: Self-pay

## 2021-09-12 DIAGNOSIS — Z1231 Encounter for screening mammogram for malignant neoplasm of breast: Secondary | ICD-10-CM | POA: Diagnosis not present

## 2021-12-01 DIAGNOSIS — I10 Essential (primary) hypertension: Secondary | ICD-10-CM | POA: Diagnosis not present

## 2021-12-01 DIAGNOSIS — R7303 Prediabetes: Secondary | ICD-10-CM | POA: Diagnosis not present

## 2021-12-01 DIAGNOSIS — E039 Hypothyroidism, unspecified: Secondary | ICD-10-CM | POA: Diagnosis not present

## 2021-12-05 DIAGNOSIS — N1831 Chronic kidney disease, stage 3a: Secondary | ICD-10-CM | POA: Insufficient documentation

## 2021-12-08 DIAGNOSIS — E039 Hypothyroidism, unspecified: Secondary | ICD-10-CM | POA: Diagnosis not present

## 2021-12-08 DIAGNOSIS — F5101 Primary insomnia: Secondary | ICD-10-CM | POA: Diagnosis not present

## 2021-12-08 DIAGNOSIS — M545 Low back pain, unspecified: Secondary | ICD-10-CM | POA: Diagnosis not present

## 2021-12-08 DIAGNOSIS — I2510 Atherosclerotic heart disease of native coronary artery without angina pectoris: Secondary | ICD-10-CM | POA: Diagnosis not present

## 2021-12-08 DIAGNOSIS — N1831 Chronic kidney disease, stage 3a: Secondary | ICD-10-CM | POA: Diagnosis not present

## 2021-12-08 DIAGNOSIS — R7401 Elevation of levels of liver transaminase levels: Secondary | ICD-10-CM | POA: Diagnosis not present

## 2021-12-08 DIAGNOSIS — K219 Gastro-esophageal reflux disease without esophagitis: Secondary | ICD-10-CM | POA: Diagnosis not present

## 2021-12-08 DIAGNOSIS — I10 Essential (primary) hypertension: Secondary | ICD-10-CM | POA: Diagnosis not present

## 2021-12-08 DIAGNOSIS — R7303 Prediabetes: Secondary | ICD-10-CM | POA: Diagnosis not present

## 2021-12-08 DIAGNOSIS — E785 Hyperlipidemia, unspecified: Secondary | ICD-10-CM | POA: Diagnosis not present

## 2022-01-19 DIAGNOSIS — E039 Hypothyroidism, unspecified: Secondary | ICD-10-CM | POA: Diagnosis not present

## 2022-03-01 ENCOUNTER — Emergency Department (HOSPITAL_COMMUNITY)
Admission: EM | Admit: 2022-03-01 | Discharge: 2022-03-01 | Disposition: A | Discharge status: Home / Self Care | Payer: Medicare Other | Attending: Emergency Medicine | Admitting: Emergency Medicine

## 2022-03-01 ENCOUNTER — Other Ambulatory Visit: Payer: Self-pay

## 2022-03-01 DIAGNOSIS — Z79899 Other long term (current) drug therapy: Secondary | ICD-10-CM | POA: Insufficient documentation

## 2022-03-01 DIAGNOSIS — Z7982 Long term (current) use of aspirin: Secondary | ICD-10-CM | POA: Insufficient documentation

## 2022-03-01 DIAGNOSIS — R040 Epistaxis: Secondary | ICD-10-CM

## 2022-03-01 MED ORDER — SILVER NITRATE-POT NITRATE 75-25 % EX MISC
CUTANEOUS | Status: AC
  Filled 2022-03-01: qty 10

## 2022-03-01 NOTE — ED Provider Notes (Signed)
?[facility] ?Provider Note ? ? ?CSN: [id number] ?Arrival date & time: 03/01/22  1218 ? ?  ? ?History ? ?Chief Complaint  ?Patient presents with  ? Epistaxis  ? ? ?Brenda Erickson is a 88 y.o. female. ? ? ?Epistaxis ? ?This patient is an 88 year old female, she is anticoagulated only with a baby aspirin, presents to the hospital today with a complaint of epistaxis from the left side of her nose.  She has had this several times over the last week, nothing acute today just some intermittent small amount of bleeding which is now stopped. ? ?She has been able to put some tissues in the left nostril with good stopping of the bleeding.  It is not large volume and she has no other symptoms including shortness of breath headache or chest pain. ? ?Home Medications ?Prior to Admission medications   ?Medication Sig Start Date End Date Taking? Authorizing Provider  ?albuterol (VENTOLIN HFA) 108 (90 Base) MCG/ACT inhaler Inhale 1-2 puffs into the lungs every 6 (six) hours as needed for wheezing or shortness of breath. 06/09/20   Manuella Ghazi, Pratik D, DO  ?amLODipine (NORVASC) 10 MG tablet Take 0.5 tablets (5 mg total) by mouth daily. 02/10/21   Perlie Mayo, NP  ?Artificial Tear Solution (TEARS AGAIN OP) Place 1 drop into both eyes daily as needed (for dry eye relief).     [provider]  ?aspirin 81 MG chewable tablet Chew 81 mg by mouth in the morning.     [provider]  ?Cholecalciferol (VITAMIN D3) 2000 UNITS TABS Take 1 tablet by mouth daily.    [provider]  ?cloNIDine (CATAPRES) 0.1 MG tablet Take 1 tablet (0.1 mg total) by mouth at bedtime. 07/16/20   Perlie Mayo, NP  ?feeding supplement, ENSURE ENLIVE, (ENSURE ENLIVE) LIQD Take 237 mLs by mouth 2 (two) times daily between meals. 06/17/20   Barton Dubois, MD  ?ferrous sulfate 325 (65 FE) MG EC tablet Take 325 mg by mouth daily.    [provider]  ?gabapentin (NEURONTIN) 300 MG capsule Take 1 capsule (300 mg total)  by mouth at bedtime. 07/16/20   Perlie Mayo, NP  ?levothyroxine (SYNTHROID) 100 MCG tablet Take 1 tablet (100 mcg total) by mouth daily before breakfast. 07/16/20   Perlie Mayo, NP  ?metoprolol succinate (TOPROL-XL) 100 MG 24 hr tablet Take 1 tablet (100 mg total) by mouth daily. Take with or immediately following a meal. 01/15/21   Perlie Mayo, NP  ?pantoprazole (PROTONIX) 40 MG tablet Take 1 tablet (40 mg total) by mouth 2 (two) times daily. 06/17/20   Barton Dubois, MD  ?rosuvastatin (CRESTOR) 10 MG tablet Take 1 tablet (10 mg total) by mouth daily. 03/19/20   Perlie Mayo, NP  ?sodium chloride (OCEAN) 0.65 % SOLN nasal spray Place 1 spray into both nostrils as needed for congestion.    [provider]  ?sucralfate (CARAFATE) 1 g tablet Take 1 tablet (1 g total) by mouth 4 (four) times daily. ?Patient taking differently: Take 1 g by mouth daily.  03/19/20   Perlie Mayo, NP  ?torsemide (DEMADEX) 20 MG tablet Take 1 tablet (20 mg total) by mouth daily as needed (for edema and swelling.). Take 20 mg by mouth daily. 06/17/20   Barton Dubois, MD  ?   ? ?Allergies    ?Codeine and Other   ? ?Review of Systems   ?Review of Systems  ?HENT:  Positive for nosebleeds.   ?  All other systems reviewed and are negative. ? ?Physical Exam ?Updated Vital Signs ?BP (!) 177/79 (BP Location: Left Arm)   Pulse 63   Temp 97.9 ?F (36.6 ?C) (Oral)   Resp 18   Ht 1.626 m ('5\' 4"'$ )   Wt 59 kg   SpO2 100%   BMI 22.31 kg/m?  ?Physical Exam ?Vitals and nursing note reviewed.  ?Constitutional:   ?   General: She is not in acute distress. ?   Appearance: She is well-developed.  ?HENT:  ?   Head: Normocephalic and atraumatic.  ?   Nose:  ?   Comments: Small spot of an active bleeding on the left nasal septum, no other bleeding, no blood in the nares ?   Mouth/Throat:  ?   Pharynx: No oropharyngeal exudate.  ?Eyes:  ?   General: No scleral icterus.    ?   Right eye: No discharge.     ?   Left eye: No discharge.  ?    Conjunctiva/sclera: Conjunctivae normal.  ?   Pupils: Pupils are equal, round, and reactive to light.  ?Neck:  ?   Thyroid: No thyromegaly.  ?   Vascular: No JVD.  ?Cardiovascular:  ?   Rate and Rhythm: Normal rate and regular rhythm.  ?   Heart sounds: Normal heart sounds. No murmur heard. ?  No friction rub. No gallop.  ?Pulmonary:  ?   Effort: Pulmonary effort is normal. No respiratory distress.  ?   Breath sounds: Normal breath sounds. No wheezing or rales.  ?Abdominal:  ?   General: Bowel sounds are normal. There is no distension.  ?   Palpations: Abdomen is soft. There is no mass.  ?   Tenderness: There is no abdominal tenderness.  ?Musculoskeletal:     ?   General: No tenderness. Normal range of motion.  ?   Cervical back: Normal range of motion and neck supple.  ?Lymphadenopathy:  ?   Cervical: No cervical adenopathy.  ?Skin: ?   General: Skin is warm and dry.  ?   Findings: No erythema or rash.  ?Neurological:  ?   Mental Status: She is alert.  ?   Coordination: Coordination normal.  ?Psychiatric:     ?   Behavior: Behavior normal.  ? ? ?ED Results / Procedures / Treatments   ?Labs ?(all labs ordered are listed, but only abnormal results are displayed) ?Labs Reviewed - No data to display ? ?EKG ?None ? ?Radiology ?No results found. ? ?Procedures ?.Epistaxis Management ? ?Date/Time: 03/01/2022 12:55 PM ?Performed by: Noemi Chapel, MD ?Authorized by: Noemi Chapel, MD  ? ?Consent:  ?  Consent obtained:  Verbal ?  Consent given by:  Patient ?  Risks, benefits, and alternatives were discussed: yes   ?  Risks discussed:  Bleeding, nasal injury and pain ?  Alternatives discussed:  Alternative treatment ?Universal protocol:  ?  Procedure explained and questions answered to patient or proxy's satisfaction: yes   ?  Relevant documents present and verified: yes   ?  Required blood products, implants, devices, and special equipment available: yes   ?  Site/side marked: yes   ?  Immediately prior to procedure, a time  out was called: yes   ?  Patient identity confirmed:  Verbally with patient ?Anesthesia:  ?  Anesthesia method:  None ?Procedure details:  ?  Treatment site:  L anterior ?  Treatment method:  Silver nitrate ?  Treatment complexity:  Limited ?  Treatment  episode: initial   ?Post-procedure details:  ?  Assessment:  Bleeding stopped ?  Procedure completion:  Tolerated well, no immediate complications ?Comments:  ?    ?  ? ? ?Medications Ordered in ED ?Medications  ?silver nitrate applicators 35-46 % applicator (has no administration in time range)  ? ? ?ED Course/ Medical Decision Making/ A&P ?  ?                        ?Medical Decision Making ? ?The patient was placed in the semirecumbent position and the nose was explored extensively.  There does appear to be an area in the left nare on the septum which is the source of bleeding however it is not actively bleeding.  Silver nitrate was used to cauterize the area, the patient will be observed in the emergency department to make sure this does not recur.  She is agreeable to the plan.  She has no tachycardia or signs of instability to suggest the need for blood work or resuscitative efforts. ? ? ? ? ? ? ? ? ? ?Final Clinical Impression(s) / ED Diagnoses ?Final diagnoses:  ?Epistaxis  ?Left-sided epistaxis  ? ? ?Rx / DC Orders ?ED Discharge Orders   ? ? None  ? ?  ? ? ?  ?Noemi Chapel, MD ?03/01/22 1334 ? ?

## 2022-03-01 NOTE — Discharge Instructions (Signed)
Vaseline int he nose with a qtip for a week ?This should gradually improve ?If bleeding occurs and doesn't stop with palpation please come back to ER. ? ? ?

## 2022-03-04 DIAGNOSIS — R040 Epistaxis: Secondary | ICD-10-CM | POA: Diagnosis not present

## 2022-03-04 DIAGNOSIS — E039 Hypothyroidism, unspecified: Secondary | ICD-10-CM | POA: Diagnosis not present

## 2022-03-24 DIAGNOSIS — E039 Hypothyroidism, unspecified: Secondary | ICD-10-CM | POA: Diagnosis not present

## 2022-03-31 DIAGNOSIS — I739 Peripheral vascular disease, unspecified: Secondary | ICD-10-CM | POA: Diagnosis not present

## 2022-03-31 DIAGNOSIS — M79642 Pain in left hand: Secondary | ICD-10-CM | POA: Diagnosis not present

## 2022-03-31 DIAGNOSIS — E039 Hypothyroidism, unspecified: Secondary | ICD-10-CM | POA: Diagnosis not present

## 2022-04-24 ENCOUNTER — Ambulatory Visit: Payer: Medicare Other | Admitting: Orthopedic Surgery

## 2022-05-18 ENCOUNTER — Ambulatory Visit (INDEPENDENT_AMBULATORY_CARE_PROVIDER_SITE_OTHER): Payer: Medicare Other | Admitting: Orthopedic Surgery

## 2022-05-18 VITALS — BP 131/76 | HR 63 | Ht 64.0 in | Wt 119.0 lb

## 2022-05-18 DIAGNOSIS — M65331 Trigger finger, right middle finger: Secondary | ICD-10-CM

## 2022-05-18 NOTE — Patient Instructions (Signed)

## 2022-05-18 NOTE — Progress Notes (Signed)
Chief Complaint  Patient presents with   Hand Pain    Left hand pain     HPI: 88 year old female history of fibromyalgia comes in with bilateral hand pain which she says is getting better.  She did have some numbness that is gone now.  Her right long finger is catching and locking she has tenderness over the A1 pulley and obvious locking which Requires manual reduction   Past Medical History:  Diagnosis Date   Acquired trigger finger 05/25/2012   Arthritis    Cancer ([place])    mass on left kidney   Change in bowel habits 09/21/2012   CONSTIPATION 04/22/2009   Qualifier: Diagnosis of  By: Christ Kick     Diverticulosis    DIVERTICULOSIS, COLON 04/22/2009   Qualifier: Diagnosis of  By: Christ Kick     EPIGASTRIC PAIN 04/22/2009   Qualifier: Diagnosis of  By: Christ Kick     Esophageal reflux    Fibromyalgia    GOITER 04/22/2009   Qualifier: History of  By: Christ Kick     Hashimoto's disease    Hashimoto's disease    Helicobacter pylori gastritis    in remote past, s/p treatment    HELICOBACTER PYLORI GASTRITIS, HX OF 04/22/2009   Qualifier: Diagnosis of  By: Christ Kick     HIP PAIN, RIGHT 04/22/2009   Qualifier: Diagnosis of  By: Christ Kick     History of gastroesophageal reflux (GERD) 05/16/2013   HTN (hypertension)    Hyperlipidemia LDL goal <70 01/29/2015   Hypothyroid    Hypothyroidism 04/22/2009   Qualifier: Diagnosis of  By: Christ Kick     Kidney stones    Low back pain radiating to both legs 05/16/2013   Myalgia and myositis 04/22/2009   Qualifier: Diagnosis of  By: Christ Kick     Renal mass, left 05/02/2013   RLQ abdominal tenderness 05/16/2014   RLQ abdominal tenderness 05/16/2014   TIA (transient ischemic attack) 01/27/2015   TRIGGER FINGER 02/11/2009   Qualifier: Diagnosis of  By: Aline Brochure MD, Criss Rosales FINGER DEFORMITY 06/25/2010   Qualifier: Diagnosis of  By: Aline Brochure MD, Chaselyn Nanney      BP 131/76   Pulse 63    Ht '5\' 4"'$  (1.626 m)   Wt 119 lb (54 kg)   BMI 20.43 kg/m     A/P  Right long finger triggering  Recommend injection right long finger  Trigger finger injection  Diagnosis  right long finger triggering  Procedure injection A1 pulley Medications lidocaine 1% 1 mL and Depo-Medrol 40 mg 1 mL Skin prep alcohol and ethyl chloride Verbal consent was obtained Timeout confirmed the injection site  After cleaning the skin with alcohol and anesthetizing the skin with ethyl chloride the A1 pulley was palpated and the injection was performed without complication

## 2022-06-29 ENCOUNTER — Other Ambulatory Visit: Payer: Self-pay | Admitting: *Deleted

## 2022-06-29 DIAGNOSIS — I739 Peripheral vascular disease, unspecified: Secondary | ICD-10-CM

## 2022-07-08 ENCOUNTER — Encounter: Payer: Self-pay | Admitting: Vascular Surgery

## 2022-07-08 ENCOUNTER — Ambulatory Visit (INDEPENDENT_AMBULATORY_CARE_PROVIDER_SITE_OTHER): Payer: Medicare Other | Admitting: Vascular Surgery

## 2022-07-08 ENCOUNTER — Ambulatory Visit (INDEPENDENT_AMBULATORY_CARE_PROVIDER_SITE_OTHER): Payer: Medicare Other

## 2022-07-08 VITALS — BP 138/69 | HR 57 | Temp 99.0°F | Ht 64.0 in | Wt 116.6 lb

## 2022-07-08 DIAGNOSIS — I739 Peripheral vascular disease, unspecified: Secondary | ICD-10-CM

## 2022-07-08 NOTE — Progress Notes (Signed)
[facility] [place]  Patient name: Brenda Erickson MRN: [id number] DOB: 15-Aug-1933 Sex: female  REASON FOR CONSULT: Evaluation lower extremity arterial circulation  HPI: Brenda Erickson is a 88 y.o. female, who is here today for evaluation.  She is here with a friend.  She reports sensation of coldness in her feet.  She does not describe any arterial rest pain and no claudication.  He does not have any lower extremity tissue loss.  He does report pain in her left knee and left hip with walking which is sounds to be arthritic.  Past Medical History:  Diagnosis Date   Acquired trigger finger 05/25/2012   Arthritis    Cancer ([place])    mass on left kidney   Change in bowel habits 09/21/2012   CONSTIPATION 04/22/2009   Qualifier: Diagnosis of  By: Christ Kick     Diverticulosis    DIVERTICULOSIS, COLON 04/22/2009   Qualifier: Diagnosis of  By: Christ Kick     EPIGASTRIC PAIN 04/22/2009   Qualifier: Diagnosis of  By: Christ Kick     Esophageal reflux    Fibromyalgia    GOITER 04/22/2009   Qualifier: History of  By: Christ Kick     Hashimoto's disease    Hashimoto's disease    Helicobacter pylori gastritis    in remote past, s/p treatment    HELICOBACTER PYLORI GASTRITIS, HX OF 04/22/2009   Qualifier: Diagnosis of  By: Christ Kick     HIP PAIN, RIGHT 04/22/2009   Qualifier: Diagnosis of  By: Christ Kick     History of gastroesophageal reflux (GERD) 05/16/2013   HTN (hypertension)    Hyperlipidemia LDL goal <70 01/29/2015   Hypothyroid    Hypothyroidism 04/22/2009   Qualifier: Diagnosis of  By: Christ Kick     Kidney stones    Low back pain radiating to both legs 05/16/2013   Myalgia and myositis 04/22/2009   Qualifier: Diagnosis of  By: Christ Kick     Renal mass, left 05/02/2013   RLQ abdominal tenderness 05/16/2014   RLQ abdominal tenderness 05/16/2014   TIA (transient ischemic attack)  01/27/2015   TRIGGER FINGER 02/11/2009   Qualifier: Diagnosis of  By: Aline Brochure MD, Criss Rosales FINGER DEFORMITY 06/25/2010   Qualifier: Diagnosis of  By: Aline Brochure MD, Dorothyann Peng      Family History  Problem Relation Age of Onset   Diabetes Father    Liver cancer Mother    Lung cancer Mother    Diabetes Sister    Other Son        backpain   Other Son        staph in jugular vein   Stroke Son    Other Son        moya moya   Hypertension Other    Cancer Other        lung   Cancer Other        cancer   Colon cancer Neg Hx     SOCIAL HISTORY: Social History   Socioeconomic History   Marital status: Widowed    Spouse name: Not on file   Number of children: 7   Years of education: 11   Highest education level: Not on file  Occupational History   Occupation: retired  Tobacco Use   Smoking status: Former    Types: Cigarettes   Smokeless tobacco: Former    Types: Snuff  Vaping Use   Vaping Use: Never used  Substance and Sexual Activity   Alcohol use: Yes    Comment: wine every now and then   Drug use: No   Sexual activity: Not Currently    Birth control/protection: Surgical  Other Topics Concern   Not on file  Social History Narrative   Lives alone,    all children live close by, but son lives in [place]   Right handed      Enjoy: reading bible, Marilynn Latino witness      Diet: all food groups   Caffeine: tea daily, coffee occassionally    Water: water-tonic water       Does not drive anymore, son brought her in   Does wear seat belt   Smoke and carbon monoxide detectors    Social Determinants of Health   Financial Resource Strain: Not on file  Food Insecurity: Not on file  Transportation Needs: No Transportation Needs (12/06/2019)   PRAPARE - Hydrologist (Medical): No    Lack of Transportation (Non-Medical): No  Physical Activity: Not on file  Stress: Not on file  Social Connections: Moderately Isolated (12/06/2019)   Social  Connection and Isolation Panel [NHANES]    Frequency of Communication with Friends and Family: More than three times a week    Frequency of Social Gatherings with Friends and Family: More than three times a week    Attends Religious Services: More than 4 times per year    Active Member of [employer] or Organizations: No    Attends Archivist Meetings: Never    Marital Status: Widowed  Intimate Partner Violence: Not At Risk (12/06/2019)   Humiliation, Afraid, Rape, and Kick questionnaire    Fear of Current or Ex-Partner: No    Emotionally Abused: No    Physically Abused: No    Sexually Abused: No    Allergies  Allergen Reactions   Codeine Nausea And Vomiting   Other     NO BLOOD PRODUCTS. Tomatoes    Current Outpatient Medications  Medication Sig Dispense Refill   albuterol (VENTOLIN HFA) 108 (90 Base) MCG/ACT inhaler Inhale 1-2 puffs into the lungs every 6 (six) hours as needed for wheezing or shortness of breath. 8 g 0   amLODipine (NORVASC) 10 MG tablet Take 0.5 tablets (5 mg total) by mouth daily.     Artificial Tear Solution (TEARS AGAIN OP) Place 1 drop into both eyes daily as needed (for dry eye relief).      aspirin 81 MG chewable tablet Chew 81 mg by mouth in the morning.      Cholecalciferol (VITAMIN D3) 2000 UNITS TABS Take 1 tablet by mouth daily.     feeding supplement, ENSURE ENLIVE, (ENSURE ENLIVE) LIQD Take 237 mLs by mouth 2 (two) times daily between meals.     ferrous sulfate 325 (65 FE) MG EC tablet Take 325 mg by mouth daily.     levothyroxine (SYNTHROID) 100 MCG tablet Take 1 tablet (100 mcg total) by mouth daily before breakfast. 30 tablet 0   metoprolol succinate (TOPROL-XL) 100 MG 24 hr tablet Take 1 tablet (100 mg total) by mouth daily. Take with or immediately following a meal. 90 tablet 1   pantoprazole (PROTONIX) 40 MG tablet Take 1 tablet (40 mg total) by mouth 2 (two) times daily. 60 tablet 1   rosuvastatin (CRESTOR) 10 MG tablet Take 1 tablet (10 mg  total) by mouth daily. 90 tablet 1   sodium chloride (OCEAN) 0.65 % SOLN nasal spray  Place 1 spray into both nostrils as needed for congestion.     cloNIDine (CATAPRES) 0.1 MG tablet Take 1 tablet (0.1 mg total) by mouth at bedtime. (Patient not taking: Reported on 05/18/2022) 90 tablet 1   gabapentin (NEURONTIN) 300 MG capsule Take 1 capsule (300 mg total) by mouth at bedtime. (Patient not taking: Reported on 05/18/2022) 30 capsule 5   sucralfate (CARAFATE) 1 g tablet Take 1 tablet (1 g total) by mouth 4 (four) times daily. (Patient not taking: Reported on 05/18/2022) 360 tablet 1   torsemide (DEMADEX) 20 MG tablet Take 1 tablet (20 mg total) by mouth daily as needed (for edema and swelling.). Take 20 mg by mouth daily. (Patient not taking: Reported on 05/18/2022)     No current facility-administered medications for this visit.    REVIEW OF SYSTEMS:  '[X]'$  denotes positive finding, '[ ]'$  denotes negative finding Cardiac  Comments:  Chest pain or chest pressure:    Shortness of breath upon exertion:    Short of breath when lying flat:    Irregular heart rhythm:        Vascular    Pain in calf, thigh, or hip brought on by ambulation: x   Pain in feet at night that wakes you up from your sleep:     Blood clot in your veins:    Leg swelling:  x       Pulmonary    Oxygen at home:    Productive cough:     Wheezing:         Neurologic    Sudden weakness in arms or legs:     Sudden numbness in arms or legs:  x   Sudden onset of difficulty speaking or slurred speech:    Temporary loss of vision in one eye:     Problems with dizziness:         Gastrointestinal    Blood in stool:     Vomited blood:         Genitourinary    Burning when urinating:     Blood in urine:        Psychiatric    Major depression:         Hematologic    Bleeding problems:    Problems with blood clotting too easily:        Skin    Rashes or ulcers: x       Constitutional    Fever or chills:       PHYSICAL EXAM: Vitals:   07/08/22 1416  BP: 138/69  Pulse: (!) 57  Temp: 99 F (37.2 C)  TempSrc: Temporal  SpO2: 97%  Weight: 116 lb 9.6 oz (52.9 kg)  Height: '5\' 4"'$  (1.626 m)    GENERAL: The patient is a well-nourished female, in no acute distress. The vital signs are documented above. CARDIOVASCULAR: 2+ radial and 2+ femoral pulses bilaterally.  Do not palpate popliteal or distal pulses. PULMONARY: There is good air exchange  MUSCULOSKELETAL: There are no major deformities or cyanosis. NEUROLOGIC: No focal weakness or paresthesias are detected. SKIN: There are no ulcers or rashes noted. PSYCHIATRIC: The patient has a normal affect.  DATA:  Noninvasive studies today reveal an ankle arm index of 1.0 on the right and 0.73 on the left.  He has diminished toe brachial indices and monophasic flow bilaterally suggesting moderate disease.  In all likelihood her right arm index is falsely elevated.  MEDICAL ISSUES: Moderate lower extremity arterial insufficiency with probable  SFA occlusive disease.  I do not feel that she is critical limb ischemia and her symptoms do not appear to be consistent with intermittent claudication.  I discussed this with the patient.  She knows to notify her provider if she develops any tissue loss on her feet.  Otherwise she will see [place] again on an as-needed basis   Rosetta Posner, MD [facility] Tel [phone] Pager [phone]  Note: Portions of this report may have been transcribed using voice recognition software.  Every effort has been made to ensure accuracy; however, inadvertent computerized transcription errors may still be present.

## 2022-08-06 ENCOUNTER — Other Ambulatory Visit (HOSPITAL_COMMUNITY): Payer: Self-pay | Admitting: Internal Medicine

## 2022-08-06 DIAGNOSIS — Z1231 Encounter for screening mammogram for malignant neoplasm of breast: Secondary | ICD-10-CM

## 2022-08-08 DIAGNOSIS — E039 Hypothyroidism, unspecified: Secondary | ICD-10-CM | POA: Diagnosis not present

## 2022-08-08 DIAGNOSIS — N1831 Chronic kidney disease, stage 3a: Secondary | ICD-10-CM | POA: Diagnosis not present

## 2022-08-08 DIAGNOSIS — E785 Hyperlipidemia, unspecified: Secondary | ICD-10-CM | POA: Diagnosis not present

## 2022-08-08 DIAGNOSIS — I129 Hypertensive chronic kidney disease with stage 1 through stage 4 chronic kidney disease, or unspecified chronic kidney disease: Secondary | ICD-10-CM | POA: Diagnosis not present

## 2022-08-28 DIAGNOSIS — E039 Hypothyroidism, unspecified: Secondary | ICD-10-CM | POA: Diagnosis not present

## 2022-08-28 DIAGNOSIS — I10 Essential (primary) hypertension: Secondary | ICD-10-CM | POA: Diagnosis not present

## 2022-09-03 DIAGNOSIS — R7401 Elevation of levels of liver transaminase levels: Secondary | ICD-10-CM | POA: Diagnosis not present

## 2022-09-03 DIAGNOSIS — E039 Hypothyroidism, unspecified: Secondary | ICD-10-CM | POA: Diagnosis not present

## 2022-09-03 DIAGNOSIS — E782 Mixed hyperlipidemia: Secondary | ICD-10-CM | POA: Diagnosis not present

## 2022-09-03 DIAGNOSIS — E1165 Type 2 diabetes mellitus with hyperglycemia: Secondary | ICD-10-CM | POA: Diagnosis not present

## 2022-09-03 DIAGNOSIS — R809 Proteinuria, unspecified: Secondary | ICD-10-CM | POA: Diagnosis not present

## 2022-09-03 DIAGNOSIS — I2510 Atherosclerotic heart disease of native coronary artery without angina pectoris: Secondary | ICD-10-CM | POA: Diagnosis not present

## 2022-09-03 DIAGNOSIS — I10 Essential (primary) hypertension: Secondary | ICD-10-CM | POA: Diagnosis not present

## 2022-09-03 DIAGNOSIS — R944 Abnormal results of kidney function studies: Secondary | ICD-10-CM | POA: Diagnosis not present

## 2022-09-03 DIAGNOSIS — F5101 Primary insomnia: Secondary | ICD-10-CM | POA: Diagnosis not present

## 2022-09-03 DIAGNOSIS — M545 Low back pain, unspecified: Secondary | ICD-10-CM | POA: Diagnosis not present

## 2022-09-03 DIAGNOSIS — I739 Peripheral vascular disease, unspecified: Secondary | ICD-10-CM | POA: Diagnosis not present

## 2022-09-14 ENCOUNTER — Ambulatory Visit (HOSPITAL_COMMUNITY)
Admission: RE | Admit: 2022-09-14 | Discharge: 2022-09-14 | Disposition: A | Discharge status: Home / Self Care | Payer: Medicare Other | Source: Ambulatory Visit | Attending: Internal Medicine | Admitting: Internal Medicine

## 2022-09-14 DIAGNOSIS — Z1231 Encounter for screening mammogram for malignant neoplasm of breast: Secondary | ICD-10-CM | POA: Diagnosis not present

## 2022-11-16 DIAGNOSIS — R21 Rash and other nonspecific skin eruption: Secondary | ICD-10-CM | POA: Diagnosis not present

## 2022-11-16 DIAGNOSIS — R413 Other amnesia: Secondary | ICD-10-CM | POA: Diagnosis not present

## 2022-12-30 ENCOUNTER — Ambulatory Visit: Payer: 59 | Admitting: Neurology

## 2023-01-05 ENCOUNTER — Encounter: Payer: Self-pay | Admitting: Neurology

## 2023-01-05 ENCOUNTER — Ambulatory Visit (INDEPENDENT_AMBULATORY_CARE_PROVIDER_SITE_OTHER): Payer: 59 | Admitting: Neurology

## 2023-01-05 ENCOUNTER — Telehealth: Payer: Self-pay | Admitting: Neurology

## 2023-01-05 VITALS — BP 110/70 | HR 63 | Ht 63.0 in | Wt 116.0 lb

## 2023-01-05 DIAGNOSIS — M545 Low back pain, unspecified: Secondary | ICD-10-CM

## 2023-01-05 DIAGNOSIS — G8929 Other chronic pain: Secondary | ICD-10-CM | POA: Diagnosis not present

## 2023-01-05 DIAGNOSIS — G309 Alzheimer's disease, unspecified: Secondary | ICD-10-CM

## 2023-01-05 DIAGNOSIS — R350 Frequency of micturition: Secondary | ICD-10-CM | POA: Diagnosis not present

## 2023-01-05 DIAGNOSIS — R413 Other amnesia: Secondary | ICD-10-CM

## 2023-01-05 MED ORDER — DONEPEZIL HCL 5 MG PO TABS: 5.0000 mg | ORAL_TABLET | Freq: Every day | ORAL | 11 refills | Status: AC

## 2023-01-05 NOTE — Progress Notes (Addendum)
[facility]  PATIENT: Brenda Erickson DOB: 03-Feb-1933  REFERRING DOCTOR OR PCP: Alonza Smoker, NP; Allyn Kenner, MD SOURCE: Patient, notes from primary care, imaging and lab reports, CT scan images personally reviewed.  _________________________________   HISTORICAL  CHIEF COMPLAINT:  Chief Complaint  Patient presents with   Room 10    Pt is here with her Daughter. [name] states that pt memory started going bad 2-3 months ago.     HISTORY OF PRESENT ILLNESS:  I had the pleasure of seeing your patient, Brenda Erickson, a [facility] for neurologic consultation regarding her memory loss.  Brenda Erickson is an 89 year old woman with cognitive dysfunction.   She has noted issues with her memory for 'a while'.     Her daughter recalls in January 2024 that her mom could not remember her name when she came over to visit.  In retrospect she had noticed her mother had mild cognitive issues the previous year.   There was no sudden change.  She has had a home health aide and help from the daughter for several years.  In 2021, according to a primary care note, the family was concerned about her memory.  At that time, she actually scored 30/30 on the Mini-Mental status exam.  She does very simple chores and does simple meal prep (sandwich) but has trouble getting dressed and needs help to bathe.  She has a home health aide.  .  Her daughter or an aide does her shopping.   She has not driven in many years (> 5).  She spends most of her time in her apartment.  Due to back issues she does not do much walking outside.      She notes she leans over forward as she walks.   She uses a cane outside the house.     She gets mildly agitated at times but no severe episodes.           01/05/2023    2:25 PM 12/20/2019    2:21 PM  MMSE - Mini Mental State Exam  Orientation to time 5 5  Orientation to Place 1 5  Registration 3 3  Attention/ Calculation 1 5   Recall 1 3  Language- name 2 objects 2 2  Language- repeat 1 1  Language- follow 3 step command 3 3  Language- read & follow direction 1 1  Write a sentence 1 1  Copy design 1 1  Total score 20 30   Her sister has dementia.   Her parents did not.     Imaging: CT scan 06/13/2020 showed mild generalized atrophy with no specific pattern.  It was otherwise normal  REVIEW OF SYSTEMS: Constitutional: No fevers, chills, sweats, or change in appetite Eyes: No visual changes, double vision, eye pain Ear, nose and throat: No hearing loss, ear pain, nasal congestion, sore throat Cardiovascular: No chest pain, palpitations Respiratory:  No shortness of breath at rest or with exertion.   No wheezes GastrointestinaI: No nausea, vomiting, diarrhea, abdominal pain, fecal incontinence Genitourinary:  No dysuria, urinary retention or frequency.  No nocturia. Musculoskeletal:  She has LBP, worse with walking Integumentary: No rash, pruritus, skin lesions Neurological: as above Psychiatric: No depression at this time.  No anxiety Endocrine: No palpitations, diaphoresis, change in appetite, change in weigh or increased thirst Hematologic/Lymphatic:  No anemia, purpura, petechiae. Allergic/Immunologic: No itchy/runny eyes, nasal congestion, recent allergic reactions, rashes  ALLERGIES: Allergies  Allergen Reactions   Codeine Nausea  And Vomiting   Other     NO BLOOD PRODUCTS. Tomatoes    HOME MEDICATIONS:  Current Outpatient Medications:    amLODipine (NORVASC) 10 MG tablet, Take 0.5 tablets (5 mg total) by mouth daily., Disp: , Rfl:    aspirin 81 MG chewable tablet, Chew 81 mg by mouth in the morning. , Disp: , Rfl:    Cholecalciferol (VITAMIN D3) 2000 UNITS TABS, Take 1 tablet by mouth daily., Disp: , Rfl:    feeding supplement, ENSURE ENLIVE, (ENSURE ENLIVE) LIQD, Take 237 mLs by mouth 2 (two) times daily between meals., Disp: , Rfl:    levothyroxine (SYNTHROID) 100 MCG tablet, Take 1 tablet  (100 mcg total) by mouth daily before breakfast., Disp: 30 tablet, Rfl: 0   metoprolol succinate (TOPROL-XL) 100 MG 24 hr tablet, Take 1 tablet (100 mg total) by mouth daily. Take with or immediately following a meal., Disp: 90 tablet, Rfl: 1   rosuvastatin (CRESTOR) 10 MG tablet, Take 1 tablet (10 mg total) by mouth daily., Disp: 90 tablet, Rfl: 1   sodium chloride (OCEAN) 0.65 % SOLN nasal spray, Place 1 spray into both nostrils as needed for congestion., Disp: , Rfl:    traZODone (DESYREL) 50 MG tablet, Take 50 mg by mouth at bedtime., Disp: , Rfl:    albuterol (VENTOLIN HFA) 108 (90 Base) MCG/ACT inhaler, Inhale 1-2 puffs into the lungs every 6 (six) hours as needed for wheezing or shortness of breath. (Patient not taking: Reported on 01/05/2023), Disp: 8 g, Rfl: 0   Artificial Tear Solution (TEARS AGAIN OP), Place 1 drop into both eyes daily as needed (for dry eye relief).  (Patient not taking: Reported on 01/05/2023), Disp: , Rfl:    cloNIDine (CATAPRES) 0.1 MG tablet, Take 1 tablet (0.1 mg total) by mouth at bedtime. (Patient not taking: Reported on 05/18/2022), Disp: 90 tablet, Rfl: 1   ferrous sulfate 325 (65 FE) MG EC tablet, Take 325 mg by mouth daily., Disp: , Rfl:    gabapentin (NEURONTIN) 300 MG capsule, Take 1 capsule (300 mg total) by mouth at bedtime. (Patient not taking: Reported on 05/18/2022), Disp: 30 capsule, Rfl: 5   pantoprazole (PROTONIX) 40 MG tablet, Take 1 tablet (40 mg total) by mouth 2 (two) times daily., Disp: 60 tablet, Rfl: 1   sucralfate (CARAFATE) 1 g tablet, Take 1 tablet (1 g total) by mouth 4 (four) times daily. (Patient not taking: Reported on 05/18/2022), Disp: 360 tablet, Rfl: 1   torsemide (DEMADEX) 20 MG tablet, Take 1 tablet (20 mg total) by mouth daily as needed (for edema and swelling.). Take 20 mg by mouth daily. (Patient not taking: Reported on 05/18/2022), Disp: , Rfl:   PAST MEDICAL HISTORY: Past Medical History:  Diagnosis Date   Acquired trigger finger  05/25/2012   Arthritis    Cancer ([place])    mass on left kidney   Change in bowel habits 09/21/2012   CONSTIPATION 04/22/2009   Qualifier: Diagnosis of  By: Christ Kick     Diverticulosis    DIVERTICULOSIS, COLON 04/22/2009   Qualifier: Diagnosis of  By: Christ Kick     EPIGASTRIC PAIN 04/22/2009   Qualifier: Diagnosis of  By: Christ Kick     Esophageal reflux    Fibromyalgia    GOITER 04/22/2009   Qualifier: History of  By: Christ Kick     Hashimoto's disease    Hashimoto's disease    Helicobacter pylori gastritis    in remote past, s/p treatment  HELICOBACTER PYLORI GASTRITIS, HX OF 04/22/2009   Qualifier: Diagnosis of  By: Christ Kick     HIP PAIN, RIGHT 04/22/2009   Qualifier: Diagnosis of  By: Christ Kick     History of gastroesophageal reflux (GERD) 05/16/2013   HTN (hypertension)    Hyperlipidemia LDL goal <70 01/29/2015   Hypothyroid    Hypothyroidism 04/22/2009   Qualifier: Diagnosis of  By: Christ Kick     Kidney stones    Low back pain radiating to both legs 05/16/2013   Myalgia and myositis 04/22/2009   Qualifier: Diagnosis of  By: Christ Kick     Renal mass, left 05/02/2013   RLQ abdominal tenderness 05/16/2014   RLQ abdominal tenderness 05/16/2014   TIA (transient ischemic attack) 01/27/2015   TRIGGER FINGER 02/11/2009   Qualifier: Diagnosis of  By: Aline Brochure MD, Criss Rosales FINGER DEFORMITY 06/25/2010   Qualifier: Diagnosis of  By: Aline Brochure MD, Dorothyann Peng      PAST SURGICAL HISTORY: Past Surgical History:  Procedure Laterality Date   ABDOMINAL HYSTERECTOMY     tumor removal; benign   BLADDER SURGERY     CHOLECYSTECTOMY     COLONOSCOPY  05/22/2003   [id number] rectum/ Left-sided diverticula.  The remainder of the colonic mucosa and terminal  ileum appeared normal   COLONOSCOPY  10/05/2012   [id number] diverticulosis. Tubular adenomas    COLONOSCOPY N/A 12/08/2017   Procedure: COLONOSCOPY;  Surgeon: Daneil Dolin, MD;   [facility]: [facility];  Service: Endoscopy;  Laterality: N/A;  8:30   ESOPHAGOGASTRODUODENOSCOPY  05/22/2003   RMR: Tiny distal esophageal erosions consistent with mild erosive reflux esophagitisOtherwise, normal upper gastrointestinal tract through the second part of   the duodenum   ESOPHAGOGASTRODUODENOSCOPY  07/22/1992   RMR: Normal EGD   ROBOTIC ASSITED PARTIAL NEPHRECTOMY     left. States was cancerous.    TOTAL HIP ARTHROPLASTY      FAMILY HISTORY: Family History  Problem Relation Age of Onset   Diabetes Father    Liver cancer Mother    Lung cancer Mother    Diabetes Sister    Other Son        backpain   Other Son        staph in jugular vein   Stroke Son    Other Son        moya moya   Hypertension Other    Cancer Other        lung   Cancer Other        cancer   Colon cancer Neg Hx     SOCIAL HISTORY: Social History   Socioeconomic History   Marital status: Widowed    Spouse name: Not on file   Number of children: 7   Years of education: 11   Highest education level: Not on file  Occupational History   Occupation: retired  Tobacco Use   Smoking status: Former    Types: Cigarettes   Smokeless tobacco: Former    Types: Chief of Staff Use: Never used  Substance and Sexual Activity   Alcohol use: Yes    Comment: wine every now and then   Drug use: No   Sexual activity: Not Currently    Birth control/protection: Surgical  Other Topics Concern   Not on file  Social History Narrative   Lives alone,    all children live close by, but son lives in [place]   Right handed  Enjoy: reading bible, Marilynn Latino witness      Diet: all food groups   Caffeine: tea daily, coffee occassionally    Water: water-tonic water       Does not drive anymore, son brought her in   Does wear seat belt   Smoke and carbon monoxide detectors    Social Determinants of Health   Financial Resource Strain: Not on file  Food Insecurity: Not on file   Transportation Needs: No Transportation Needs (12/06/2019)   PRAPARE - Hydrologist (Medical): No    Lack of Transportation (Non-Medical): No  Physical Activity: Not on file  Stress: Not on file  Social Connections: Moderately Isolated (12/06/2019)   Social Connection and Isolation Panel [NHANES]    Frequency of Communication with Friends and Family: More than three times a week    Frequency of Social Gatherings with Friends and Family: More than three times a week    Attends Religious Services: More than 4 times per year    Active Member of [employer] or Organizations: No    Attends Archivist Meetings: Never    Marital Status: Widowed  Intimate Partner Violence: Not At Risk (12/06/2019)   Humiliation, Afraid, Rape, and Kick questionnaire    Fear of Current or Ex-Partner: No    Emotionally Abused: No    Physically Abused: No    Sexually Abused: No       PHYSICAL EXAM  Vitals:   01/05/23 1421  BP: 110/70  Pulse: 63  Weight: 116 lb (52.6 kg)  Height: '5\' 3"'$  (1.6 m)    Body mass index is 20.55 kg/m.   General: The patient is well-developed and well-nourished and in no acute distress  HEENT:  Head is [place]/AT.  Sclera are anicteric.    Neck: No carotid bruits are noted.  The neck is nontender.  Cardiovascular: The heart has a regular rate and rhythm with a normal S1 and S2. There were no murmurs, gallops or rubs.    Skin: Extremities are without rash or  edema.  Musculoskeletal:  Back is nontender  Neurologic Exam  Mental status: The patient is alert and oriented x 2 at the time of the examination. The patient has reduced recent and remote memory, with reduced  normal attention span and concentration ability.   Speech is normal.   MMSE was 20/30  Cranial nerves: Extraocular movements are full. Facial strengh and sensation are normal.  l. No dysarthria is noted.  The tongue is midline, and the patient has symmetric elevation of the soft  palate. No obvious hearing deficits are noted.  Motor:  Muscle bulk is normal.   Tone is normal. Strength is  5 / 5 in all 4 extremities.   Sensory: Sensory testing is intact to pinprick, soft touch and vibration sensation in aarms but reduced vibration in feet.    Coordination: Cerebellar testing reveals good finger-nose-finger and heel-to-shin bilaterally.  Gait and station: Station is normal.   Gait is arthritic and stooped over.  It is wide.  Cannot tandem walk.    Romberg is negative.   Reflexes: Deep tendon reflexes are symmetric and normal bilaterally.   Plantar responses are flexor.    DIAGNOSTIC DATA (LABS, IMAGING, TESTING) - I reviewed patient records, labs, notes, testing and imaging myself where available.  Lab Results  Component Value Date   WBC 13.7 (H) 06/16/2020   HGB 12.4 06/16/2020   HCT 38.5 06/16/2020   MCV 91.7 06/16/2020  PLT 454 (H) 06/16/2020      Component Value Date/Time   NA 138 06/16/2020 0758   K 3.9 06/16/2020 0758   CL 101 06/16/2020 0758   CO2 29 06/16/2020 0758   GLUCOSE 152 (H) 06/16/2020 0758   BUN 18 06/16/2020 0758   CREATININE 0.81 06/16/2020 0758   CREATININE 0.98 (H) 03/19/2020 1024   CALCIUM 8.8 (L) 06/16/2020 0758   PROT 6.4 (L) 06/14/2020 0545   ALBUMIN 3.4 (L) 06/14/2020 0545   AST 30 06/14/2020 0545   ALT 39 06/14/2020 0545   ALKPHOS 55 06/14/2020 0545   BILITOT 0.8 06/14/2020 0545   GFRNONAA >60 06/16/2020 0758   GFRNONAA 53 (L) 03/19/2020 1024   GFRAA >60 06/16/2020 0758   GFRAA 61 03/19/2020 1024   Lab Results  Component Value Date   CHOL 158 03/19/2020   HDL 56 03/19/2020   LDLCALC 78 03/19/2020   TRIG 100 06/08/2020   CHOLHDL 2.8 03/19/2020   Lab Results  Component Value Date   HGBA1C 6.3 (H) 03/19/2020   No results found for: "VITAMINB12" Lab Results  Component Value Date   TSH 1.11 03/19/2020       ASSESSMENT AND PLAN  Alzheimer's disease, unspecified (CODE) ([place]) - Plan: ATN PROFILE, Vitamin  B12, TSH, MR BRAIN WO CONTRAST  Memory loss  Chronic low back pain, unspecified back pain laterality, unspecified whether sciatica present  Urinary frequency  In summary, Brenda Erickson is an 89 year old woman who has had cognitive decline over the past few years.  Her score today on the Mini-Mental status exam was 20/30 which would be consistent with mild dementia.  The pattern of points loss would be consistent with Alzheimer's disease.  Other dementias are also possible.  To further evaluate this, we will: Check MRI of the brain without contrast to determine the atrophy pattern and to assess for severity of ischemic change and to rule out treatable causes of dementia. Check TSH, B12 and the ATN profile for Alzheimer's disease (amyloid 42/40 ratio, pTau181, neurofilament light) I will have her start donepezil 5 mg and this can be titrated up depending on tolerability to 10 mg They will return to see me as needed based on the results of the studies and response.  The daughter should call if any significant change in neurologic function.  Thank you for asking me to see Brenda Erickson.  Please let me know if I can be of further assistance with her or other patients in the future.   Marquee Fuchs A. Felecia Shelling, MD, [facility] [id number], [id number] PM Certified in Neurology, Clinical Neurophysiology, Sleep Medicine and Neuroimaging  [facility] [address] [place], [place] [address] [phone]

## 2023-01-05 NOTE — Telephone Encounter (Signed)
[employer] medicare/[place] medicaid NPR sent to GI [phone]

## 2023-01-08 LAB — TSH: TSH: 5.03 u[IU]/mL — ABNORMAL HIGH (ref 0.450–4.500)

## 2023-01-08 LAB — ATN PROFILE
A -- Beta-amyloid 42/40 Ratio: 0.094 — ABNORMAL LOW (ref >0.102)
Beta-amyloid 40: 271.46 pg/mL
Beta-amyloid 42: 25.49 pg/mL
N -- NfL, Plasma: 8.13 pg/mL (ref 0.00–11.55)
T -- p-tau181: 2.25 pg/mL — ABNORMAL HIGH (ref 0.00–0.97)

## 2023-01-08 LAB — VITAMIN B12: Vitamin B-12: 526 pg/mL (ref 232–1245)

## 2023-01-11 ENCOUNTER — Telehealth: Payer: Self-pay | Admitting: Neurology

## 2023-01-11 NOTE — Telephone Encounter (Signed)
The ATN test showed amyloid 42/40 ratio and increase pTau181.  This pattern is consistent with amyloid pathology.  Therefore, she most likely has Alzheimer's disease.  Last week I sent in a prescription for donepezil.  She does not think that she has started yet.  She reports her family picks up her medications from the drugstore and I advised her to start after they are able to pick it up.  I also discussed that we will double the dose later if she tolerates it well

## 2023-01-13 ENCOUNTER — Telehealth: Payer: Self-pay | Admitting: Neurology

## 2023-01-13 NOTE — Telephone Encounter (Signed)
Called pt's daughter and let her know that Dr. Felecia Shelling said that he wanted the pt to cut the pill in half and do half a pill with dinner.  After 2 weeks she can try to go up to 1 pill.  If she continues to have nausea doing this let us know. [name].

## 2023-01-13 NOTE — Telephone Encounter (Signed)
[name] Maudie Mercury on [place] called stating that the pt took donepezil (ARICEPT) 5 MG tablet  last night for the first time and she woke up weak and nauseous . Daughter would like to know if the medication can be changed. Please advise.

## 2023-01-24 ENCOUNTER — Ambulatory Visit
Admission: RE | Admit: 2023-01-24 | Discharge: 2023-01-24 | Disposition: A | Discharge status: Home / Self Care | Payer: 59 | Source: Ambulatory Visit | Attending: Neurology | Admitting: Neurology

## 2023-01-24 DIAGNOSIS — G309 Alzheimer's disease, unspecified: Secondary | ICD-10-CM

## 2023-01-25 ENCOUNTER — Other Ambulatory Visit: Payer: Self-pay | Admitting: Neurology

## 2023-01-25 MED ORDER — MEMANTINE HCL 10 MG PO TABS: ORAL_TABLET | ORAL | 5 refills | Status: DC

## 2023-01-25 NOTE — Telephone Encounter (Signed)
Called pt's daughter and let her know that Dr. Felecia Shelling sent [name] into her mother's pharmacy. Dr. Felecia Shelling said that he wants the pt for the 1st week to take 1/2 of a pill 2x's daily and then the following week take 1 whole pill 2x's daily. [name] states that her mom said that she took 1/2 of the Donepezil once in the morning and 1 at night and felt fine so [name] instead of trying new medication. Told pt's daughter that new scrip can stay in pharmacy and soon as she starts feeling bad to start the Memantine. [name].

## 2023-01-25 NOTE — Telephone Encounter (Addendum)
Patient daughter Maudie Mercury informed about patient MRI of brain.   Kim asked me to relay to you that pt tried cutting the donepezil (ARICEPT) 5 MG tablet  in half, but it still made her nauseated (she took with food) and weak. Daughter asked if pt could have something else.     Please advise

## 2023-02-24 DIAGNOSIS — I10 Essential (primary) hypertension: Secondary | ICD-10-CM | POA: Diagnosis not present

## 2023-02-24 DIAGNOSIS — E039 Hypothyroidism, unspecified: Secondary | ICD-10-CM | POA: Diagnosis not present

## 2023-02-24 DIAGNOSIS — R7303 Prediabetes: Secondary | ICD-10-CM | POA: Diagnosis not present

## 2023-02-24 DIAGNOSIS — E785 Hyperlipidemia, unspecified: Secondary | ICD-10-CM | POA: Diagnosis not present

## 2023-03-02 DIAGNOSIS — R944 Abnormal results of kidney function studies: Secondary | ICD-10-CM | POA: Diagnosis not present

## 2023-03-02 DIAGNOSIS — N1831 Chronic kidney disease, stage 3a: Secondary | ICD-10-CM | POA: Diagnosis not present

## 2023-03-02 DIAGNOSIS — R809 Proteinuria, unspecified: Secondary | ICD-10-CM | POA: Diagnosis not present

## 2023-03-02 DIAGNOSIS — I129 Hypertensive chronic kidney disease with stage 1 through stage 4 chronic kidney disease, or unspecified chronic kidney disease: Secondary | ICD-10-CM | POA: Diagnosis not present

## 2023-03-02 DIAGNOSIS — E782 Mixed hyperlipidemia: Secondary | ICD-10-CM | POA: Diagnosis not present

## 2023-03-02 DIAGNOSIS — E1122 Type 2 diabetes mellitus with diabetic chronic kidney disease: Secondary | ICD-10-CM | POA: Diagnosis not present

## 2023-03-02 DIAGNOSIS — E039 Hypothyroidism, unspecified: Secondary | ICD-10-CM | POA: Diagnosis not present

## 2023-03-02 DIAGNOSIS — I2510 Atherosclerotic heart disease of native coronary artery without angina pectoris: Secondary | ICD-10-CM | POA: Diagnosis not present

## 2023-03-02 DIAGNOSIS — R7989 Other specified abnormal findings of blood chemistry: Secondary | ICD-10-CM | POA: Diagnosis not present

## 2023-03-02 DIAGNOSIS — I739 Peripheral vascular disease, unspecified: Secondary | ICD-10-CM | POA: Diagnosis not present

## 2023-03-02 DIAGNOSIS — E1165 Type 2 diabetes mellitus with hyperglycemia: Secondary | ICD-10-CM | POA: Diagnosis not present

## 2023-06-30 DIAGNOSIS — E039 Hypothyroidism, unspecified: Secondary | ICD-10-CM | POA: Diagnosis not present

## 2023-06-30 DIAGNOSIS — E1165 Type 2 diabetes mellitus with hyperglycemia: Secondary | ICD-10-CM | POA: Diagnosis not present

## 2023-06-30 DIAGNOSIS — E782 Mixed hyperlipidemia: Secondary | ICD-10-CM | POA: Diagnosis not present

## 2023-07-06 DIAGNOSIS — R809 Proteinuria, unspecified: Secondary | ICD-10-CM | POA: Diagnosis not present

## 2023-07-06 DIAGNOSIS — E039 Hypothyroidism, unspecified: Secondary | ICD-10-CM | POA: Diagnosis not present

## 2023-07-06 DIAGNOSIS — I2510 Atherosclerotic heart disease of native coronary artery without angina pectoris: Secondary | ICD-10-CM | POA: Diagnosis not present

## 2023-07-06 DIAGNOSIS — R944 Abnormal results of kidney function studies: Secondary | ICD-10-CM | POA: Diagnosis not present

## 2023-07-06 DIAGNOSIS — N1831 Chronic kidney disease, stage 3a: Secondary | ICD-10-CM | POA: Diagnosis not present

## 2023-07-06 DIAGNOSIS — E1165 Type 2 diabetes mellitus with hyperglycemia: Secondary | ICD-10-CM | POA: Diagnosis not present

## 2023-07-06 DIAGNOSIS — R7401 Elevation of levels of liver transaminase levels: Secondary | ICD-10-CM | POA: Diagnosis not present

## 2023-07-06 DIAGNOSIS — M545 Low back pain, unspecified: Secondary | ICD-10-CM | POA: Diagnosis not present

## 2023-07-06 DIAGNOSIS — I739 Peripheral vascular disease, unspecified: Secondary | ICD-10-CM | POA: Diagnosis not present

## 2023-07-06 DIAGNOSIS — E782 Mixed hyperlipidemia: Secondary | ICD-10-CM | POA: Diagnosis not present

## 2023-07-06 DIAGNOSIS — I129 Hypertensive chronic kidney disease with stage 1 through stage 4 chronic kidney disease, or unspecified chronic kidney disease: Secondary | ICD-10-CM | POA: Diagnosis not present

## 2023-08-11 ENCOUNTER — Other Ambulatory Visit (HOSPITAL_COMMUNITY): Payer: Self-pay | Admitting: Internal Medicine

## 2023-08-11 DIAGNOSIS — Z1231 Encounter for screening mammogram for malignant neoplasm of breast: Secondary | ICD-10-CM

## 2023-09-17 ENCOUNTER — Encounter (HOSPITAL_COMMUNITY): Payer: Self-pay

## 2023-09-17 ENCOUNTER — Ambulatory Visit (HOSPITAL_COMMUNITY)
Admission: RE | Admit: 2023-09-17 | Discharge: 2023-09-17 | Disposition: A | Discharge status: Home / Self Care | Payer: 59 | Source: Ambulatory Visit | Attending: Internal Medicine | Admitting: Internal Medicine

## 2023-09-17 DIAGNOSIS — Z1231 Encounter for screening mammogram for malignant neoplasm of breast: Secondary | ICD-10-CM | POA: Diagnosis not present

## 2023-09-22 ENCOUNTER — Other Ambulatory Visit: Payer: Self-pay | Admitting: Neurology

## 2023-09-22 NOTE — Telephone Encounter (Signed)
Last seen on 01/05/23 No follow up scheduled

## 2023-09-27 DIAGNOSIS — H25812 Combined forms of age-related cataract, left eye: Secondary | ICD-10-CM | POA: Diagnosis not present

## 2023-09-29 NOTE — H&P (Signed)
Surgical History & Physical  Patient Name: Brenda Erickson  DOB: 10/02/1933  Surgery: Cataract extraction with intraocular lens implant phacoemulsification; Left Eye Surgeon: Fabio Pierce MD Surgery Date: 10/04/2023 Pre-Op Date: 09/02/2023  HPI: A 48 Yr. old female patient 1. 1. The patient is here for Cataract evaluation. Pt. was seen by Dr. Charise Killian in 2022, and was told she had cataracts, but was not ready for surgery. Pt. states vision has got worse since. Pt. complains of difficulty when recognizing people, which began 1 year ago, gradually. Both eyes are affected. The episode is constant. This is negatively affecting the patient's quality of life and the patient is unable to function adequately in life with the current level of vision. Pt. states eyes water, and uses artificial tears which helps. HPI was performed by Fabio Pierce .  Medical History:  Arthritis Diabetes High Blood Pressure LDL Thyroid Problems  Review of Systems Cardiovascular High Blood Pressure Endocrine thyroid problems, diabetes Musculoskeletal arthritis pains All recorded systems are negative except as noted above.  Social Never smoked  Medication donepezil ,  amlodipine ,  trazodone ,  rosuvastatin ,  metoprolol succinate ,  Farxiga ,  memantine ,  Synthroid   Sx/Procedures None  Drug Allergies  codeine   History & Physical: Heent: cataracts NECK: supple without bruits LUNGS: lungs clear to auscultation CV: regular rate and rhythm Abdomen: soft and non-tender  Impression & Plan: Assessment: 1.  COMBINED FORMS AGE RELATED CATARACT; Both Eyes (H25.813) 2.  Myopia ; Right Eye (H52.11) 3.  VITREOUS DETACHMENT PVD; Both Eyes (H43.813) 4.  ASTIGMATISM, REGULAR; Both Eyes (H52.223)  Plan: 1.  Cataract accounts for the patient's decreased vision. This visual impairment is not correctable with a tolerable change in glasses or contact lenses. Cataract surgery with an implantation of a new lens  should significantly improve the visual and functional status of the patient. Discussed all risks, benefits, alternatives, and potential complications. Discussed the procedures and recovery. Patient desires to have surgery. A-scan ordered and performed today for intra-ocular lens calculations. The surgery will be performed in order to improve vision for driving, reading, and for eye examinations. Recommend phacoemulsification with intra-ocular lens. Recommend Dextenza for post-operative pain and inflammation. Left Eye worse - first. Dilates well - shugarcaine by protocol. Recommend toric IOL.  2.   Dr. Charise Killian  3.  Old Asymptomatic. RD precautions given. Patient to call with increase in flashing lights/floaters/dark curtain.  4.  Recommend toric IOL OU.

## 2023-09-30 ENCOUNTER — Encounter (HOSPITAL_COMMUNITY)
Admission: RE | Admit: 2023-09-30 | Discharge: 2023-09-30 | Disposition: A | Discharge status: Home / Self Care | Payer: 59 | Source: Ambulatory Visit | Attending: Ophthalmology | Admitting: Ophthalmology

## 2023-09-30 NOTE — Pre-Procedure Instructions (Signed)
Attempted pre-op phonecall. Left VM for her to call us back. 

## 2023-10-04 ENCOUNTER — Ambulatory Visit (HOSPITAL_COMMUNITY): Payer: 59 | Admitting: Certified Registered"

## 2023-10-04 ENCOUNTER — Encounter (HOSPITAL_COMMUNITY): Payer: Self-pay | Admitting: Ophthalmology

## 2023-10-04 ENCOUNTER — Ambulatory Visit (HOSPITAL_COMMUNITY)
Admission: RE | Admit: 2023-10-04 | Discharge: 2023-10-04 | Disposition: A | Discharge status: Home / Self Care | Payer: 59 | Attending: Ophthalmology | Admitting: Ophthalmology

## 2023-10-04 ENCOUNTER — Ambulatory Visit (HOSPITAL_BASED_OUTPATIENT_CLINIC_OR_DEPARTMENT_OTHER): Payer: 59 | Admitting: Certified Registered"

## 2023-10-04 ENCOUNTER — Encounter (HOSPITAL_COMMUNITY)
Admission: RE | Disposition: A | Discharge status: Home / Self Care | Payer: Self-pay | Source: Home / Self Care | Attending: Ophthalmology

## 2023-10-04 DIAGNOSIS — H25813 Combined forms of age-related cataract, bilateral: Secondary | ICD-10-CM | POA: Insufficient documentation

## 2023-10-04 DIAGNOSIS — H52223 Regular astigmatism, bilateral: Secondary | ICD-10-CM | POA: Insufficient documentation

## 2023-10-04 DIAGNOSIS — Z7984 Long term (current) use of oral hypoglycemic drugs: Secondary | ICD-10-CM | POA: Diagnosis not present

## 2023-10-04 DIAGNOSIS — M1990 Unspecified osteoarthritis, unspecified site: Secondary | ICD-10-CM | POA: Insufficient documentation

## 2023-10-04 DIAGNOSIS — H5211 Myopia, right eye: Secondary | ICD-10-CM | POA: Diagnosis not present

## 2023-10-04 DIAGNOSIS — E063 Autoimmune thyroiditis: Secondary | ICD-10-CM | POA: Insufficient documentation

## 2023-10-04 DIAGNOSIS — H43813 Vitreous degeneration, bilateral: Secondary | ICD-10-CM | POA: Diagnosis not present

## 2023-10-04 DIAGNOSIS — G709 Myoneural disorder, unspecified: Secondary | ICD-10-CM | POA: Insufficient documentation

## 2023-10-04 DIAGNOSIS — Z8673 Personal history of transient ischemic attack (TIA), and cerebral infarction without residual deficits: Secondary | ICD-10-CM | POA: Insufficient documentation

## 2023-10-04 DIAGNOSIS — M797 Fibromyalgia: Secondary | ICD-10-CM | POA: Diagnosis not present

## 2023-10-04 DIAGNOSIS — E1136 Type 2 diabetes mellitus with diabetic cataract: Secondary | ICD-10-CM | POA: Diagnosis not present

## 2023-10-04 DIAGNOSIS — H25812 Combined forms of age-related cataract, left eye: Secondary | ICD-10-CM | POA: Diagnosis not present

## 2023-10-04 DIAGNOSIS — E039 Hypothyroidism, unspecified: Secondary | ICD-10-CM | POA: Insufficient documentation

## 2023-10-04 DIAGNOSIS — Z87891 Personal history of nicotine dependence: Secondary | ICD-10-CM | POA: Diagnosis not present

## 2023-10-04 HISTORY — PX: CATARACT EXTRACTION W/PHACO: SHX586

## 2023-10-04 SURGERY — PHACOEMULSIFICATION, CATARACT, WITH IOL INSERTION
Anesthesia: Monitor Anesthesia Care | Site: Eye | Laterality: Left

## 2023-10-04 MED ORDER — PHENYLEPHRINE HCL 2.5 % OP SOLN
1.0000 [drp] | OPHTHALMIC | Status: AC | PRN
  Administered 2023-10-04 (×3): 1 [drp] via OPHTHALMIC

## 2023-10-04 MED ORDER — SODIUM HYALURONATE 10 MG/ML IO SOLUTION
PREFILLED_SYRINGE | INTRAOCULAR | Status: DC | PRN
  Administered 2023-10-04: .85 mL via INTRAOCULAR

## 2023-10-04 MED ORDER — MOXIFLOXACIN HCL 5 MG/ML IO SOLN
INTRAOCULAR | Status: DC | PRN
  Administered 2023-10-04: .2 mL via INTRACAMERAL

## 2023-10-04 MED ORDER — POVIDONE-IODINE 5 % OP SOLN
OPHTHALMIC | Status: DC | PRN
  Administered 2023-10-04: 1 via OPHTHALMIC

## 2023-10-04 MED ORDER — BSS IO SOLN
INTRAOCULAR | Status: DC | PRN
  Administered 2023-10-04: 15 mL via INTRAOCULAR

## 2023-10-04 MED ORDER — PHENYLEPHRINE-KETOROLAC 1-0.3 % IO SOLN
INTRAOCULAR | Status: DC | PRN
  Administered 2023-10-04: 500 mL via OPHTHALMIC

## 2023-10-04 MED ORDER — SODIUM HYALURONATE 23MG/ML IO SOSY
PREFILLED_SYRINGE | INTRAOCULAR | Status: DC | PRN
  Administered 2023-10-04: .6 mL via INTRAOCULAR

## 2023-10-04 MED ORDER — LIDOCAINE HCL (PF) 1 % IJ SOLN
INTRAOCULAR | Status: DC | PRN
  Administered 2023-10-04: 1 mL via OPHTHALMIC

## 2023-10-04 MED ORDER — TETRACAINE HCL 0.5 % OP SOLN
1.0000 [drp] | OPHTHALMIC | Status: AC | PRN
  Administered 2023-10-04 (×3): 1 [drp] via OPHTHALMIC

## 2023-10-04 MED ORDER — STERILE WATER FOR IRRIGATION IR SOLN
Status: DC | PRN
  Administered 2023-10-04: 250 mL

## 2023-10-04 MED ORDER — LIDOCAINE HCL 3.5 % OP GEL
1.0000 | Freq: Once | OPHTHALMIC | Status: AC
  Administered 2023-10-04: 1 via OPHTHALMIC

## 2023-10-04 MED ORDER — TROPICAMIDE 1 % OP SOLN
1.0000 [drp] | OPHTHALMIC | Status: AC | PRN
  Administered 2023-10-04 (×3): 1 [drp] via OPHTHALMIC

## 2023-10-04 SURGICAL SUPPLY — 14 items
CATARACT SUITE SIGHTPATH (MISCELLANEOUS) ×1
CLOTH BEACON ORANGE TIMEOUT ST (SAFETY) ×1 IMPLANT
EYE SHIELD UNIVERSAL CLEAR (GAUZE/BANDAGES/DRESSINGS) IMPLANT
FEE CATARACT SUITE SIGHTPATH (MISCELLANEOUS) ×1 IMPLANT
GLOVE BIOGEL PI IND STRL 7.0 (GLOVE) ×2 IMPLANT
LENS IOL TECNIS EYHANCE 21.5 (Intraocular Lens) IMPLANT
NDL HYPO 18GX1.5 BLUNT FILL (NEEDLE) ×1 IMPLANT
NEEDLE HYPO 18GX1.5 BLUNT FILL (NEEDLE) ×1
PAD ARMBOARD 7.5X6 YLW CONV (MISCELLANEOUS) ×1 IMPLANT
POSITIONER HEAD 8X9X4 ADT (SOFTGOODS) ×1 IMPLANT
RING MALYGIN 7.0 (MISCELLANEOUS) IMPLANT
SYR TB 1ML LL NO SAFETY (SYRINGE) ×1 IMPLANT
TAPE SURG TRANSPORE 1 IN (GAUZE/BANDAGES/DRESSINGS) IMPLANT
WATER STERILE IRR 250ML POUR (IV SOLUTION) ×1 IMPLANT

## 2023-10-04 NOTE — Anesthesia Postprocedure Evaluation (Signed)
Anesthesia Post Note  Patient: Brenda Erickson  Procedure(s) Performed: CATARACT EXTRACTION PHACO AND INTRAOCULAR LENS PLACEMENT (IOC) (Left: Eye)  Patient location during evaluation: PACU Anesthesia Type: MAC Level of consciousness: awake and alert Pain management: pain level controlled Vital Signs Assessment: post-procedure vital signs reviewed and stable Respiratory status: spontaneous breathing, nonlabored ventilation, respiratory function stable and patient connected to nasal cannula oxygen Cardiovascular status: stable and blood pressure returned to baseline Postop Assessment: no apparent nausea or vomiting Anesthetic complications: no   There were no known notable events for this encounter.   Last Vitals:  Vitals:   10/04/23 1045 10/04/23 1124  BP: 137/75 (!) 142/78  Pulse: (!) 54 60  Resp: 14 18  Temp: 36.5 C   SpO2: 99% 100%    Last Pain:  Vitals:   10/04/23 1124  PainSc: 0-No pain                 Yuritzi Kamp L Lani Mendiola

## 2023-10-04 NOTE — Discharge Instructions (Addendum)
Please discharge patient when stable, will follow up today with Dr. Carnella Fryman at the [facility] office immediately following discharge.  Leave shield in place until visit.  All paperwork with discharge instructions will be given at the office.  [facility] Address:  [address]  [place], [place] [address]  

## 2023-10-04 NOTE — Anesthesia Preprocedure Evaluation (Addendum)
Anesthesia Evaluation  Patient identified by MRN, date of birth, ID band Patient awake    Reviewed: Allergy & Precautions, H&P , NPO status , Patient's Chart, lab work & pertinent test results, reviewed documented beta blocker date and time   Airway Mallampati: II  TM Distance: >3 FB Neck ROM: full    Dental no notable dental hx. (+) Dental Advisory Given, Teeth Intact,    Pulmonary former smoker   Pulmonary exam normal breath sounds clear to auscultation       Cardiovascular Exercise Tolerance: Good hypertension, Normal cardiovascular exam Rhythm:regular Rate:Normal     Neuro/Psych TIA Neuromuscular disease  negative psych ROS   GI/Hepatic Neg liver ROS,GERD  ,,  Endo/Other  Hypothyroidism  Hashimotos disease  Renal/GU negative Renal ROS  negative genitourinary   Musculoskeletal  (+) Arthritis , Osteoarthritis,  Fibromyalgia -  Abdominal   Peds  Hematology negative hematology ROS (+)   Anesthesia Other Findings   Reproductive/Obstetrics negative OB ROS                             Anesthesia Physical Anesthesia Plan  ASA: 3  Anesthesia Plan: MAC   Post-op Pain Management: Minimal or no pain anticipated   Induction:   PONV Risk Score and Plan:   Airway Management Planned: Nasal Cannula and Natural Airway  Additional Equipment: None  Intra-op Plan:   Post-operative Plan:   Informed Consent: I have reviewed the patients History and Physical, chart, labs and discussed the procedure including the risks, benefits and alternatives for the proposed anesthesia with the patient or authorized representative who has indicated his/her understanding and acceptance.     Dental Advisory Given  Plan Discussed with: CRNA  Anesthesia Plan Comments:         Anesthesia Quick Evaluation

## 2023-10-04 NOTE — Op Note (Signed)
Date of procedure: 10/04/23  Pre-operative diagnosis: Visually significant age-related combined cataract, Left Eye (H25.812)  Post-operative diagnosis: Visually significant age-related combined cataract, Left Eye (H25.812)  Procedure: Removal of cataract via phacoemulsification and insertion of intra-ocular lens Laural Benes and Johnson DIB00 +21.5D into the capsular bag of the Left Eye  Attending surgeon: Rudy Jew. Farrel Guimond, MD, MA  Anesthesia: MAC, Topical Akten  Complications: None  Estimated Blood Loss: <12mL (minimal)  Specimens: None  Implants: As above  Indications:  Visually significant age-related cataract, Left Eye  Procedure:  The patient was seen and identified in the pre-operative area. The operative eye was identified and dilated.  The operative eye was marked.  Topical anesthesia was administered to the operative eye.     The patient was then to the operative suite and placed in the supine position.  A timeout was performed confirming the patient, procedure to be performed, and all other relevant information.   The patient's face was prepped and draped in the usual fashion for intra-ocular surgery.  A lid speculum was placed into the operative eye and the surgical microscope moved into place and focused.  An inferotemporal paracentesis was created using a 20 gauge paracentesis blade.  Shugarcaine was injected into the anterior chamber.  Viscoelastic was injected into the anterior chamber.  A temporal clear-corneal main wound incision was created using a 2.32mm microkeratome.  A continuous curvilinear capsulorrhexis was initiated using an irrigating cystitome and completed using capsulorrhexis forceps.  Hydrodissection and hydrodeliniation were performed.  Viscoelastic was injected into the anterior chamber.  A phacoemulsification handpiece and a chopper as a second instrument were used to remove the nucleus and epinucleus. The irrigation/aspiration handpiece was used to remove any  remaining cortical material.   The capsular bag was reinflated with viscoelastic, checked, and found to be intact.  The intraocular lens was inserted into the capsular bag.  The irrigation/aspiration handpiece was used to remove any remaining viscoelastic.  The clear corneal wound and paracentesis wounds were then hydrated and checked with Weck-Cels to be watertight. 0.51mL of Moxfloxacin was injected into the anterior chamber. The lid-speculum was removed.  The drape was removed.  The patient's face was cleaned with a wet and dry 4x4.    A clear shield was taped over the eye. The patient was taken to the post-operative care unit in good condition, having tolerated the procedure well.  Post-Op Instructions: The patient will follow up at [facility] for a same day post-operative evaluation and will receive all other orders and instructions.

## 2023-10-04 NOTE — Interval H&P Note (Signed)
History and Physical Interval Note:  10/04/2023 11:01 AM  Brenda Erickson  has presented today for surgery, with the diagnosis of combined forms age related cataract, left eye.  The various methods of treatment have been discussed with the patient and family. After consideration of risks, benefits and other options for treatment, the patient has consented to  Procedure(s) with comments: CATARACT EXTRACTION PHACO AND INTRAOCULAR LENS PLACEMENT (IOC) (Left) - CDE: as a surgical intervention.  The patient's history has been reviewed, patient examined, no change in status, stable for surgery.  I have reviewed the patient's chart and labs.  Questions were answered to the patient's satisfaction.     Fabio Pierce

## 2023-10-04 NOTE — Anesthesia Procedure Notes (Signed)
Procedure Name: MAC Date/Time: 10/04/2023 11:07 AM  Performed by: Julian Reil, CRNAPre-anesthesia Checklist: Patient identified, Emergency Drugs available, Suction available and Patient being monitored Patient Re-evaluated:Patient Re-evaluated prior to induction Oxygen Delivery Method: Nasal cannula Placement Confirmation: positive ETCO2

## 2023-10-04 NOTE — Transfer of Care (Signed)
Immediate Anesthesia Transfer of Care Note  Patient: Brenda Erickson  Procedure(s) Performed: CATARACT EXTRACTION PHACO AND INTRAOCULAR LENS PLACEMENT (IOC) (Left: Eye)  Patient Location: Short Stay  Anesthesia Type:MAC  Level of Consciousness: awake, alert , and oriented  Airway & Oxygen Therapy: Patient Spontanous Breathing  Post-op Assessment: Report given to RN and Post -op Vital signs reviewed and stable  Post vital signs: Reviewed and stable  Last Vitals:  Vitals Value Taken Time  BP    Temp    Pulse    Resp    SpO2      Last Pain:  Vitals:   10/04/23 1045  PainSc: 10-Worst pain ever      Patients Stated Pain Goal: 9 (10/04/23 1045)  Complications: No notable events documented.

## 2023-10-06 ENCOUNTER — Encounter (HOSPITAL_COMMUNITY): Payer: Self-pay | Admitting: Ophthalmology

## 2023-11-22 ENCOUNTER — Telehealth: Payer: Self-pay | Admitting: *Deleted

## 2023-11-22 ENCOUNTER — Telehealth: Payer: Self-pay | Admitting: Neurology

## 2023-11-22 NOTE — Telephone Encounter (Signed)
 Pt medical records faxed 2 times to [phone]

## 2023-11-22 NOTE — Telephone Encounter (Signed)
 [name] called re: A request for medical records

## 2023-11-23 NOTE — Telephone Encounter (Signed)
 [name] giving new fax number to send medical records  [phone]

## 2023-11-29 ENCOUNTER — Encounter (HOSPITAL_COMMUNITY)
Admission: RE | Admit: 2023-11-29 | Discharge: 2023-11-29 | Disposition: A | Discharge status: Home / Self Care | Payer: 59 | Source: Ambulatory Visit | Attending: Ophthalmology | Admitting: Ophthalmology

## 2023-11-30 NOTE — Telephone Encounter (Signed)
Gave completed/signed forms back to medical records to process for pt.   

## 2023-11-30 NOTE — Telephone Encounter (Signed)
Medical source opinion of patients capability to manage benefits form completed. Placed on POD for MD to review and sign.

## 2023-12-01 NOTE — H&P (Signed)
Surgical History & Physical  Patient Name: Brenda Erickson  DOB: 1933/01/03  Surgery: Cataract extraction with intraocular lens implant phacoemulsification; Right Eye Surgeon: Fabio Pierce MD Surgery Date: 12/06/2023 Pre-Op Date: 11/07/2024  HPI: A 34 Yr. old female patient present for 5 week post op OS. Patient never returned after having cataract surgery. Patient is still using Combo gtt OS. Patient also here for persistent blurry vision in the right eye. Difficulties reading fine print, watching TV, recognizing people from a distance. Vision is hazy/blurred. This is negatively affecting the patient's quality of life and the patient is unable to function adequately in life with the current level of vision. Patient would like to proceed with cataract sx OD. HPI Completed by Dr. Fabio Pierce  Medical History:  Arthritis Diabetes High Blood Pressure LDL Thyroid Problems  Review of Systems Cardiovascular High Blood Pressure Endocrine thyroid problems, diabetes Musculoskeletal arthritis pains All recorded systems are negative except as noted above.  Social Never smoked   Medication donepezil ,  amlodipine ,  trazodone ,  rosuvastatin ,  metoprolol succinate ,  Farxiga ,  memantine ,  Synthroid   Sx/Procedures Phaco c IOL OS  Drug Allergies  codeine    History & Physical: Heent: cataract NECK: supple without bruits LUNGS: lungs clear to auscultation CV: regular rate and rhythm Abdomen: soft and non-tender  Impression & Plan: Assessment: 1.  COMBINED FORMS AGE RELATED CATARACT; Right Eye (H25.811) 2.  CATARACT EXTRACTION STATUS; Left Eye (Z98.42) 3.  VITREOUS DETACHMENT PVD; Both Eyes (H43.813) 4.  ASTIGMATISM, REGULAR; Both Eyes (H52.223)  Plan: 1.  Cataract accounts for the patient's decreased vision. This visual impairment is not correctable with a tolerable change in glasses or contact lenses. Cataract surgery with an implantation of a new lens should  significantly improve the visual and functional status of the patient. Discussed all risks, benefits, alternatives, and potential complications. Discussed the procedures and recovery. Patient desires to have surgery. A-scan ordered and performed today for intra-ocular lens calculations. The surgery will be performed in order to improve vision for driving, reading, and for eye examinations. Recommend phacoemulsification with intra-ocular lens. Recommend Dextenza for post-operative pain and inflammation. Right Eye. Surgery required to correct imbalance of vision. Dilates well - shugarcaine by protocol.  2.  1 month after Phaco PCIOL. Doing very well with improved vision and normal IOP. Stop all post-operative medications. Call with any concerning symptoms.  3.  Old Asymptomatic. RD precautions given. Patient to call with increase in flashing lights/floaters/dark curtain. Symptomatic.  4.  Recommend toric IOL OU.

## 2023-12-02 ENCOUNTER — Emergency Department (HOSPITAL_COMMUNITY)
Admission: EM | Admit: 2023-12-02 | Discharge: 2023-12-02 | Disposition: A | Discharge status: Home / Self Care | Payer: 59

## 2023-12-02 ENCOUNTER — Emergency Department (HOSPITAL_COMMUNITY): Payer: 59

## 2023-12-02 ENCOUNTER — Other Ambulatory Visit: Payer: Self-pay

## 2023-12-02 ENCOUNTER — Encounter (HOSPITAL_COMMUNITY): Payer: Self-pay

## 2023-12-02 DIAGNOSIS — E039 Hypothyroidism, unspecified: Secondary | ICD-10-CM | POA: Insufficient documentation

## 2023-12-02 DIAGNOSIS — Z7982 Long term (current) use of aspirin: Secondary | ICD-10-CM | POA: Insufficient documentation

## 2023-12-02 DIAGNOSIS — H7090 Unspecified mastoiditis, unspecified ear: Secondary | ICD-10-CM | POA: Diagnosis not present

## 2023-12-02 DIAGNOSIS — Z79890 Hormone replacement therapy (postmenopausal): Secondary | ICD-10-CM | POA: Insufficient documentation

## 2023-12-02 DIAGNOSIS — H9202 Otalgia, left ear: Secondary | ICD-10-CM | POA: Diagnosis not present

## 2023-12-02 DIAGNOSIS — Z79899 Other long term (current) drug therapy: Secondary | ICD-10-CM | POA: Insufficient documentation

## 2023-12-02 DIAGNOSIS — R220 Localized swelling, mass and lump, head: Secondary | ICD-10-CM | POA: Diagnosis not present

## 2023-12-02 DIAGNOSIS — H6092 Unspecified otitis externa, left ear: Secondary | ICD-10-CM | POA: Diagnosis not present

## 2023-12-02 DIAGNOSIS — I10 Essential (primary) hypertension: Secondary | ICD-10-CM | POA: Insufficient documentation

## 2023-12-02 DIAGNOSIS — H60502 Unspecified acute noninfective otitis externa, left ear: Secondary | ICD-10-CM

## 2023-12-02 LAB — BASIC METABOLIC PANEL
Anion gap: 10 (ref 5–15)
BUN: 20 mg/dL (ref 8–23)
CO2: 29 mmol/L (ref 22–32)
Calcium: 10 mg/dL (ref 8.9–10.3)
Chloride: 101 mmol/L (ref 98–111)
Creatinine, Ser: 1.01 mg/dL — ABNORMAL HIGH (ref 0.44–1.00)
GFR, Estimated: 54 mL/min — ABNORMAL LOW (ref >60)
Glucose, Bld: 112 mg/dL — ABNORMAL HIGH (ref 70–99)
Potassium: 4.4 mmol/L (ref 3.5–5.1)
Sodium: 140 mmol/L (ref 135–145)

## 2023-12-02 LAB — CBC
HCT: 47.8 % — ABNORMAL HIGH (ref 36.0–46.0)
Hemoglobin: 15.5 g/dL — ABNORMAL HIGH (ref 12.0–15.0)
MCH: 30.9 pg (ref 26.0–34.0)
MCHC: 32.4 g/dL (ref 30.0–36.0)
MCV: 95.4 fL (ref 80.0–100.0)
Platelets: 223 10*3/uL (ref 150–400)
RBC: 5.01 MIL/uL (ref 3.87–5.11)
RDW: 13.7 % (ref 11.5–15.5)
WBC: 8.7 10*3/uL (ref 4.0–10.5)
nRBC: 0 % (ref 0.0–0.2)

## 2023-12-02 MED ORDER — ACETAMINOPHEN 500 MG PO TABS
1000.0000 mg | ORAL_TABLET | Freq: Once | ORAL | Status: AC
  Administered 2023-12-02: 1000 mg via ORAL
  Filled 2023-12-02: qty 2

## 2023-12-02 MED ORDER — CIPROFLOXACIN-HYDROCORTISONE 0.2-1 % OT SUSP
3.0000 [drp] | Freq: Two times a day (BID) | OTIC | Status: DC
  Filled 2023-12-02: qty 10

## 2023-12-02 MED ORDER — IOHEXOL 300 MG/ML  SOLN
75.0000 mL | Freq: Once | INTRAMUSCULAR | Status: AC | PRN
  Administered 2023-12-02: 75 mL via INTRAVENOUS

## 2023-12-02 MED ORDER — CIPRO HC 0.2-1 % OT SUSP: 3.0000 [drp] | Freq: Two times a day (BID) | OTIC | 0 refills | Status: AC

## 2023-12-02 NOTE — ED Triage Notes (Signed)
Pt arrived via POV c/o left earache X 1 week. Unable to fully assess the Pts left ear in Triage due to swelling.

## 2023-12-02 NOTE — ED Provider Notes (Signed)
[place] EMERGENCY DEPARTMENT AT [facility] Provider Note   CSN: [id number] Arrival date & time: 12/02/23  1256     History  Chief Complaint  Patient presents with   Otalgia    Brenda Erickson is a 89 y.o. female with past medical history of GERD, diverticulosis, hypertension, hyperlipidemia, hypothyroidism presenting to the emergency room with left ear pain.  Patient reports it has been approximately 1 week since this started.  He denies any change in hearing, dizziness, fever or chills.  Reports pain improved with Tylenol.  Has never had anything like this in the past.   Otalgia      Home Medications Prior to Admission medications   Medication Sig Start Date End Date Taking? Authorizing Provider  amLODipine (NORVASC) 10 MG tablet Take 0.5 tablets (5 mg total) by mouth daily. 02/10/21   Freddy Finner, NP  aspirin 81 MG chewable tablet Chew 81 mg by mouth in the morning.     [provider]  Cholecalciferol (VITAMIN D3) 2000 UNITS TABS Take 1 tablet by mouth daily.    [provider]  donepezil (ARICEPT) 5 MG tablet Take 1 tablet (5 mg total) by mouth at bedtime. 01/05/23   Sater, Pearletha Furl, MD  feeding supplement, ENSURE ENLIVE, (ENSURE ENLIVE) LIQD Take 237 mLs by mouth 2 (two) times daily between meals. 06/17/20   Vassie Loll, MD  ferrous sulfate 325 (65 FE) MG EC tablet Take 325 mg by mouth daily.    [provider]  levothyroxine (SYNTHROID) 100 MCG tablet Take 1 tablet (100 mcg total) by mouth daily before breakfast. 07/16/20   Freddy Finner, NP  memantine (NAMENDA) 10 MG tablet Take 1 tablet (10 mg total) by mouth 2 (two) times daily. 09/22/23   Sater, Pearletha Furl, MD  metoprolol succinate (TOPROL-XL) 100 MG 24 hr tablet Take 1 tablet (100 mg total) by mouth daily. Take with or immediately following a meal. 01/15/21   Freddy Finner, NP  pantoprazole (PROTONIX) 40 MG tablet Take 1 tablet (40 mg total) by mouth 2 (two) times daily. 06/17/20    Vassie Loll, MD  rosuvastatin (CRESTOR) 10 MG tablet Take 1 tablet (10 mg total) by mouth daily. 03/19/20   Freddy Finner, NP  sodium chloride (OCEAN) 0.65 % SOLN nasal spray Place 1 spray into both nostrils as needed for congestion.    [provider]  traZODone (DESYREL) 50 MG tablet Take 50 mg by mouth at bedtime.    [provider]      Allergies    Codeine and Other    Review of Systems   Review of Systems  HENT:  Positive for ear pain.     Physical Exam Updated Vital Signs BP 138/81   Pulse 85   Temp 98.4 F (36.9 C) (Oral)   Resp 19   Ht 5\' 3"  (1.6 m)   Wt 51 kg   SpO2 98%   BMI 19.92 kg/m  Physical Exam Vitals and nursing note reviewed.  Constitutional:      General: She is not in acute distress.    Appearance: She is not toxic-appearing.  HENT:     Head: Normocephalic and atraumatic.     Right Ear: Tympanic membrane, ear canal and external ear normal.     Ears:     Comments: Left external ear swelling, swelling and erythema of external ear canal  Eyes:     General: No scleral icterus.    Conjunctiva/sclera: Conjunctivae  normal.  Cardiovascular:     Rate and Rhythm: Normal rate and regular rhythm.     Pulses: Normal pulses.     Heart sounds: Normal heart sounds.  Pulmonary:     Effort: Pulmonary effort is normal. No respiratory distress.     Breath sounds: Normal breath sounds.  Abdominal:     General: Abdomen is flat. Bowel sounds are normal.     Palpations: Abdomen is soft.     Tenderness: There is no abdominal tenderness.  Skin:    General: Skin is warm and dry.     Findings: No lesion.  Neurological:     General: No focal deficit present.     Mental Status: She is alert and oriented to person, place, and time. Mental status is at baseline.     ED Results / Procedures / Treatments   Labs (all labs ordered are listed, but only abnormal results are displayed) Labs Reviewed  CBC - Abnormal; Notable for the following  components:      Result Value   Hemoglobin 15.5 (*)    HCT 47.8 (*)    All other components within normal limits  BASIC METABOLIC PANEL - Abnormal; Notable for the following components:   Glucose, Bld 112 (*)    Creatinine, Ser 1.01 (*)    GFR, Estimated 54 (*)    All other components within normal limits    EKG None  Radiology CT Temporal Bones W Contrast Result Date: 12/02/2023 CLINICAL DATA:  Mastoiditis.  Left earache. EXAM: CT TEMPORAL BONES WITH CONTRAST TECHNIQUE: Axial and coronal plane CT imaging of the petrous temporal bones was performed with thin-collimation image reconstruction following intravenous contrast administration. Multiplanar CT image reconstructions were also generated. RADIATION DOSE REDUCTION: This exam was performed according to the departmental dose-optimization program which includes automated exposure control, adjustment of the mA and/or kV according to patient size and/or use of iterative reconstruction technique. CONTRAST:  75mL OMNIPAQUE IOHEXOL 300 MG/ML  SOLN COMPARISON:  MR head without contrast 01/24/2023. FINDINGS: RIGHT TEMPORAL BONE External auditory canal: The right external auditory canal is patent. Middle ear cavity: Normally aerated. The scutum and ossicles are normal. The tegmen tympani is intact. Inner ear structures: The cochlea, vestibule and semicircular canals are normal. The vestibular aqueduct is not enlarged. Internal auditory and facial nerve canals:  Normal Mastoid air cells: Normally aerated. No osseous erosion. LEFT TEMPORAL BONE External auditory canal: Mild soft swelling and enhancement is present in the left external auditory canal. Slight thickening of the tympanic membrane is noted. No obstruction is present. No mass lesion or abscess is present. Enhancement extends into the pinna. Middle ear cavity: Normally aerated. The scutum and ossicles are normal. The tegmen tympani is intact. Inner ear structures: The cochlea, vestibule and  semicircular canals are normal. The vestibular aqueduct is not enlarged. Internal auditory and facial nerve canals:  Normal. Mastoid air cells: Normally aerated. No osseous erosion. Vascular: Normal non-contrast appearance of the carotid canals, jugular bulbs and sigmoid plates. Limited intracranial: Mild generalized atrophy is present. Mild diffuse white matter disease is present. A remote lacunar infarct is present in the right caudate head. No acute infarct, hemorrhage, or mass lesion is present. Visible orbits/paranasal sinuses: A left lens replacement is present. The globes and orbits are otherwise within normal limits. Soft tissues: The visualized extracranial soft tissues are otherwise within normal limits. IMPRESSION: 1. Mild soft tissue swelling and enhancement in the left external auditory canal compatible with otitis externa. 2. No mass  lesion or abscess. 3. Normal appearance of the right temporal bone. 4. Mild generalized atrophy and diffuse white matter disease likely reflects the sequela of chronic microvascular ischemia. 5. Remote lacunar infarct of the right caudate head. Electronically Signed   By: Marin Roberts M.D.   On: 12/02/2023 16:03    Procedures Procedures    Medications Ordered in ED Medications  acetaminophen (TYLENOL) tablet 1,000 mg (1,000 mg Oral Given 12/02/23 1422)  iohexol (OMNIPAQUE) 300 MG/ML solution 75 mL (75 mLs Intravenous Contrast Given 12/02/23 1522)    ED Course/ Medical Decision Making/ A&P                                 Medical Decision Making Amount and/or Complexity of Data Reviewed Labs: ordered. Radiology: ordered.  Risk OTC drugs. Prescription drug management.   Chancy Hurter 89 y.o. presented today for URI like symptoms. Working DDx that I considered at this time includes, but not limited to, viral illness, pharyngitis, mono, sinusitis, electrolyte abnormality, AOM.  R/o DDx: these additional diagnoses are not consistent with  patient's history, presentation, physical exam, labs/imaging findings.  Review of prior external notes: 07/05/24 OV  Labs:  CBC and BMP without acute abnormality   Imaging:  Temporal bones to rule out mastoiditis.  CT imaging shows findings concerning for otitis externa.  No other acute findings.  Did discuss with patient that it shows old areas of infarct.  Patient was aware of these findings from prior MRI.  Problem List / ED Course / Critical interventions / Medication management  Patient reporting to emergency room with findings consistent with otitis externa.  She is able to ambulate with steady gait and is well-appearing.  Throughout stay has been hemodynamically stable with no fever.  Secondary to swelling of external ear I did opt to get CT imaging to rule out mastoiditis or malignant otitis externa.  Findings on CT were consistent with otitis externa.  I tried placing ear wick however patient did not tolerate.  Will treat with drops.  Patient's pain did improve with Tylenol.  Patient given return precautions and will follow-up with her primary care doctor.  Stable for discharge. Did not tolerate ear wick. Pharmacy reporting we don't have ear drops here, so I am unable to give her first dose of ear drop while here, she will go pick up medications today. I ordered medication including Tylenol  Reevaluation of the patient after these medicines showed that the patient improved Patients vitals assessed. Upon arrival patient is  hemodynamically stable.  I have reviewed the patients home medicines and have made adjustments as needed              Final Clinical Impression(s) / ED Diagnoses Final diagnoses:  Acute otitis externa of left ear, unspecified type    Rx / DC Orders ED Discharge Orders     None         Raford Pitcher Evalee Jefferson 12/02/23 1937    Durwin Glaze, MD 12/02/23 2322

## 2023-12-02 NOTE — ED Provider Triage Note (Signed)
Emergency Medicine Provider Triage Evaluation Note  Brenda Erickson , a 89 y.o. female  was evaluated in triage.  Pt complains of left ear pain >1.5weeks, getting worse, severe pain.  Review of Systems  Positive: Left ear pain Negative: Hearing change, fevers, chills   Physical Exam  BP 138/81   Pulse 85   Temp 98.4 F (36.9 C) (Oral)   Resp 19   Ht 5\' 3"  (1.6 m)   Wt 51 kg   SpO2 98%   BMI 19.92 kg/m  Gen:   Awake, no distress   Resp:  Normal effort  MSK:   Moves extremities without difficulty  Other:  Tenderness and swelling of external ear, swelling of  Medical Decision Making  Medically screening exam initiated at 1:49 PM.  Appropriate orders placed.  Brenda Erickson was informed that the remainder of the evaluation will be completed by another provider, this initial triage assessment does not replace that evaluation, and the importance of remaining in the ED until their evaluation is complete.  Prediabetes, CKD stage 3, HLD, HTN Patient stable requesting pain medicatinos.    Brenda Knudsen, PA-C 12/02/23 1354

## 2023-12-02 NOTE — Discharge Instructions (Addendum)
You were seen in the emergency room today for your infection.  Please alternate Tylenol and ibuprofen for pain control.  I have sent you home with eardrops please use as prescribed. Do not put anything else in ear or use Q-tips. Please make sure to follow-up with your primary care to ensure resolution of symptoms and return to emergency room if you have new or worsening symptoms.  Your CT scan and labs are reassuring.  CT scan is consistent with otitis externa.  There are not findings on CT consistent with old stroke which is also seen on prior imaging.

## 2023-12-07 NOTE — Telephone Encounter (Signed)
Form faxed by medical records 12/06/2023

## 2023-12-14 ENCOUNTER — Encounter (HOSPITAL_COMMUNITY): Payer: 59

## 2023-12-23 DIAGNOSIS — H25811 Combined forms of age-related cataract, right eye: Secondary | ICD-10-CM | POA: Diagnosis not present

## 2023-12-27 NOTE — H&P (Signed)
Surgical History & Physical  Patient Name: Brenda Erickson  DOB: Mar 20, 1933  Surgery: Cataract extraction with intraocular lens implant phacoemulsification; Right Eye Surgeon: Fabio Pierce MD Surgery Date: 01/03/2024 Pre-Op Date: 11/07/2024  HPI: A 42 Yr. old female patient present for 5 week post op OS. Patient never returned after having cataract surgery. Patient is still using Combo gtt OS. Patient also here for persistent blurry vision in the right eye. Difficulties reading fine print, watching TV, recognizing people from a distance. Vision is hazy/blurred. This is negatively affecting the patient's quality of life and the patient is unable to function adequately in life with the current level of vision. Patient would like to proceed with cataract sx OD. HPI Completed by Dr. Fabio Pierce  Medical History:  Arthritis Diabetes High Blood Pressure LDL Thyroid Problems  Review of Systems Cardiovascular High Blood Pressure Endocrine thyroid problems, diabetes Musculoskeletal arthritis pains All recorded systems are negative except as noted above.  Social Never smoked   Medication donepezil ,  amlodipine ,  trazodone ,  rosuvastatin ,  metoprolol succinate ,  Farxiga ,  memantine ,  Synthroid   Sx/Procedures Phaco c IOL OS   Drug Allergies  codeine   History & Physical: Heent: cataract NECK: supple without bruits LUNGS: lungs clear to auscultation CV: regular rate and rhythm Abdomen: soft and non-tender  Impression & Plan: Assessment: 1.  COMBINED FORMS AGE RELATED CATARACT; Right Eye (H25.811) 2.  CATARACT EXTRACTION STATUS; Left Eye (Z98.42) 3.  VITREOUS DETACHMENT PVD; Both Eyes (H43.813) 4.  ASTIGMATISM, REGULAR; Both Eyes (H52.223)  Plan: 1.  Cataract accounts for the patient's decreased vision. This visual impairment is not correctable with a tolerable change in glasses or contact lenses. Cataract surgery with an implantation of a new lens should  significantly improve the visual and functional status of the patient. Discussed all risks, benefits, alternatives, and potential complications. Discussed the procedures and recovery. Patient desires to have surgery. A-scan ordered and performed today for intra-ocular lens calculations. The surgery will be performed in order to improve vision for driving, reading, and for eye examinations. Recommend phacoemulsification with intra-ocular lens. Recommend Dextenza for post-operative pain and inflammation. Right Eye. Surgery required to correct imbalance of vision. Dilates well - shugarcaine by protocol.  2.  1 month after Phaco PCIOL. Doing very well with improved vision and normal IOP. Stop all post-operative medications. Call with any concerning symptoms.  3.  Old Asymptomatic. RD precautions given. Patient to call with increase in flashing lights/floaters/dark curtain. Symptomatic.  4.  Recommend toric IOL OU.

## 2023-12-29 ENCOUNTER — Encounter (HOSPITAL_COMMUNITY)
Admission: RE | Admit: 2023-12-29 | Discharge: 2023-12-29 | Disposition: A | Discharge status: Home / Self Care | Payer: 59 | Source: Ambulatory Visit | Attending: Ophthalmology | Admitting: Ophthalmology

## 2023-12-29 ENCOUNTER — Encounter (HOSPITAL_COMMUNITY): Payer: Self-pay

## 2023-12-29 HISTORY — DX: Chronic kidney disease, unspecified: N18.9

## 2023-12-29 HISTORY — DX: Personal history of urinary calculi: Z87.442

## 2023-12-29 HISTORY — DX: Type 2 diabetes mellitus without complications: E11.9

## 2024-01-03 ENCOUNTER — Ambulatory Visit (HOSPITAL_COMMUNITY): Payer: 59 | Admitting: Anesthesiology

## 2024-01-03 ENCOUNTER — Encounter (HOSPITAL_COMMUNITY): Payer: Self-pay | Admitting: Ophthalmology

## 2024-01-03 ENCOUNTER — Ambulatory Visit (HOSPITAL_COMMUNITY)
Admission: RE | Admit: 2024-01-03 | Discharge: 2024-01-03 | Disposition: A | Discharge status: Home / Self Care | Payer: 59 | Attending: Ophthalmology | Admitting: Ophthalmology

## 2024-01-03 ENCOUNTER — Encounter (HOSPITAL_COMMUNITY)
Admission: RE | Disposition: A | Discharge status: Home / Self Care | Payer: Self-pay | Source: Home / Self Care | Attending: Ophthalmology

## 2024-01-03 DIAGNOSIS — I129 Hypertensive chronic kidney disease with stage 1 through stage 4 chronic kidney disease, or unspecified chronic kidney disease: Secondary | ICD-10-CM | POA: Insufficient documentation

## 2024-01-03 DIAGNOSIS — Z9842 Cataract extraction status, left eye: Secondary | ICD-10-CM | POA: Insufficient documentation

## 2024-01-03 DIAGNOSIS — M1990 Unspecified osteoarthritis, unspecified site: Secondary | ICD-10-CM | POA: Insufficient documentation

## 2024-01-03 DIAGNOSIS — H43813 Vitreous degeneration, bilateral: Secondary | ICD-10-CM | POA: Diagnosis not present

## 2024-01-03 DIAGNOSIS — Z7984 Long term (current) use of oral hypoglycemic drugs: Secondary | ICD-10-CM | POA: Diagnosis not present

## 2024-01-03 DIAGNOSIS — E1151 Type 2 diabetes mellitus with diabetic peripheral angiopathy without gangrene: Secondary | ICD-10-CM | POA: Diagnosis not present

## 2024-01-03 DIAGNOSIS — E1136 Type 2 diabetes mellitus with diabetic cataract: Secondary | ICD-10-CM | POA: Insufficient documentation

## 2024-01-03 DIAGNOSIS — H52223 Regular astigmatism, bilateral: Secondary | ICD-10-CM | POA: Insufficient documentation

## 2024-01-03 DIAGNOSIS — E063 Autoimmune thyroiditis: Secondary | ICD-10-CM | POA: Insufficient documentation

## 2024-01-03 DIAGNOSIS — I2510 Atherosclerotic heart disease of native coronary artery without angina pectoris: Secondary | ICD-10-CM | POA: Insufficient documentation

## 2024-01-03 DIAGNOSIS — N1831 Chronic kidney disease, stage 3a: Secondary | ICD-10-CM | POA: Diagnosis not present

## 2024-01-03 DIAGNOSIS — Z79899 Other long term (current) drug therapy: Secondary | ICD-10-CM | POA: Insufficient documentation

## 2024-01-03 DIAGNOSIS — Z8673 Personal history of transient ischemic attack (TIA), and cerebral infarction without residual deficits: Secondary | ICD-10-CM | POA: Diagnosis not present

## 2024-01-03 DIAGNOSIS — Z87891 Personal history of nicotine dependence: Secondary | ICD-10-CM

## 2024-01-03 DIAGNOSIS — M797 Fibromyalgia: Secondary | ICD-10-CM | POA: Insufficient documentation

## 2024-01-03 DIAGNOSIS — I10 Essential (primary) hypertension: Secondary | ICD-10-CM | POA: Diagnosis not present

## 2024-01-03 DIAGNOSIS — E1122 Type 2 diabetes mellitus with diabetic chronic kidney disease: Secondary | ICD-10-CM | POA: Diagnosis not present

## 2024-01-03 DIAGNOSIS — H25811 Combined forms of age-related cataract, right eye: Secondary | ICD-10-CM | POA: Insufficient documentation

## 2024-01-03 HISTORY — PX: CATARACT EXTRACTION W/PHACO: SHX586

## 2024-01-03 LAB — GLUCOSE, CAPILLARY: Glucose-Capillary: 119 mg/dL — ABNORMAL HIGH (ref 70–99)

## 2024-01-03 SURGERY — PHACOEMULSIFICATION, CATARACT, WITH IOL INSERTION
Anesthesia: Monitor Anesthesia Care | Site: Eye | Laterality: Right

## 2024-01-03 MED ORDER — MOXIFLOXACIN HCL 5 MG/ML IO SOLN
INTRAOCULAR | Status: DC | PRN
  Administered 2024-01-03: .3 mL via INTRACAMERAL

## 2024-01-03 MED ORDER — PHENYLEPHRINE HCL 2.5 % OP SOLN
1.0000 [drp] | OPHTHALMIC | Status: AC | PRN
  Administered 2024-01-03 (×3): 1 [drp] via OPHTHALMIC

## 2024-01-03 MED ORDER — EPINEPHRINE PF 1 MG/ML IJ SOLN
INTRAOCULAR | Status: DC | PRN
  Administered 2024-01-03: 500 mL

## 2024-01-03 MED ORDER — TROPICAMIDE 1 % OP SOLN
1.0000 [drp] | OPHTHALMIC | Status: AC | PRN
  Administered 2024-01-03 (×3): 1 [drp] via OPHTHALMIC

## 2024-01-03 MED ORDER — TETRACAINE HCL 0.5 % OP SOLN
1.0000 [drp] | OPHTHALMIC | Status: AC | PRN
  Administered 2024-01-03 (×3): 1 [drp] via OPHTHALMIC

## 2024-01-03 MED ORDER — SODIUM HYALURONATE 23MG/ML IO SOSY
PREFILLED_SYRINGE | INTRAOCULAR | Status: DC | PRN
  Administered 2024-01-03: .6 mL via INTRAOCULAR

## 2024-01-03 MED ORDER — BSS IO SOLN
INTRAOCULAR | Status: DC | PRN
  Administered 2024-01-03: 15 mL via INTRAOCULAR

## 2024-01-03 MED ORDER — LIDOCAINE HCL 3.5 % OP GEL
1.0000 | Freq: Once | OPHTHALMIC | Status: AC
  Administered 2024-01-03: 1 via OPHTHALMIC

## 2024-01-03 MED ORDER — SODIUM HYALURONATE 10 MG/ML IO SOLUTION
PREFILLED_SYRINGE | INTRAOCULAR | Status: DC | PRN
  Administered 2024-01-03: .85 mL via INTRAOCULAR

## 2024-01-03 MED ORDER — STERILE WATER FOR IRRIGATION IR SOLN
Status: DC | PRN
  Administered 2024-01-03: 1

## 2024-01-03 MED ORDER — LIDOCAINE HCL (PF) 1 % IJ SOLN
INTRAOCULAR | Status: DC | PRN
  Administered 2024-01-03: 1 mL via OPHTHALMIC

## 2024-01-03 MED ORDER — POVIDONE-IODINE 5 % OP SOLN
OPHTHALMIC | Status: DC | PRN
  Administered 2024-01-03: 1 via OPHTHALMIC

## 2024-01-03 SURGICAL SUPPLY — 12 items
CATARACT SUITE SIGHTPATH (MISCELLANEOUS) ×1 IMPLANT
CLOTH BEACON ORANGE TIMEOUT ST (SAFETY) ×1 IMPLANT
EYE SHIELD UNIVERSAL CLEAR (GAUZE/BANDAGES/DRESSINGS) IMPLANT
FEE CATARACT SUITE SIGHTPATH (MISCELLANEOUS) ×1 IMPLANT
GLOVE BIOGEL PI IND STRL 7.0 (GLOVE) ×2 IMPLANT
LENS IOL TECNIS EYHANCE 20.0 (Intraocular Lens) IMPLANT
NDL HYPO 18GX1.5 BLUNT FILL (NEEDLE) ×1 IMPLANT
NEEDLE HYPO 18GX1.5 BLUNT FILL (NEEDLE) ×1 IMPLANT
PAD ARMBOARD 7.5X6 YLW CONV (MISCELLANEOUS) ×1 IMPLANT
SYR TB 1ML LL NO SAFETY (SYRINGE) ×1 IMPLANT
TAPE SURG TRANSPORE 1 IN (GAUZE/BANDAGES/DRESSINGS) IMPLANT
WATER STERILE IRR 250ML POUR (IV SOLUTION) ×1 IMPLANT

## 2024-01-03 NOTE — Anesthesia Postprocedure Evaluation (Signed)
 Anesthesia Post Note  Patient: Brenda Erickson  Procedure(s) Performed: CATARACT EXTRACTION PHACO AND INTRAOCULAR LENS PLACEMENT (IOC) (Right: Eye)  Patient location during evaluation: PACU Anesthesia Type: MAC Level of consciousness: awake and alert Pain management: pain level controlled Vital Signs Assessment: post-procedure vital signs reviewed and stable Respiratory status: spontaneous breathing, nonlabored ventilation, respiratory function stable and patient connected to nasal cannula oxygen Cardiovascular status: stable and blood pressure returned to baseline Postop Assessment: no apparent nausea or vomiting Anesthetic complications: no   There were no known notable events for this encounter.   Last Vitals:  Vitals:   01/03/24 0839 01/03/24 0919  BP: (!) 160/69   Pulse: (!) 55 (!) 55  Resp: 14   Temp: 36.6 C 36.6 C  SpO2: 98%     Last Pain:  Vitals:   01/03/24 0919  TempSrc: Oral  PainSc:                  Gaetano Hawthorne

## 2024-01-03 NOTE — Transfer of Care (Signed)
 Immediate Anesthesia Transfer of Care Note  Patient: Brenda Erickson  Procedure(s) Performed: CATARACT EXTRACTION PHACO AND INTRAOCULAR LENS PLACEMENT (IOC) (Right: Eye)  Patient Location: Short Stay  Anesthesia Type:MAC  Level of Consciousness: awake, alert , and oriented  Airway & Oxygen Therapy: Patient Spontanous Breathing  Post-op Assessment: Report given to RN and Post -op Vital signs reviewed and stable  Post vital signs: Reviewed and stable  Last Vitals:  Vitals Value Taken Time  BP 165/71   Temp 36.6 C 01/03/24 0919  Pulse 55 01/03/24 0919  Resp 16   SpO2 100%     Last Pain:  Vitals:   01/03/24 0919  TempSrc: Oral  PainSc:       Patients Stated Pain Goal: 5 (01/03/24 0839)  Complications: No notable events documented.

## 2024-01-03 NOTE — Op Note (Signed)
 Date of procedure: 01/03/24  Pre-operative diagnosis:  Visually significant combined form age-related cataract, Right Eye (H25.811)  Post-operative diagnosis:  Visually significant combined form age-related cataract, Right Eye (H25.811)  Procedure: Removal of cataract via phacoemulsification and insertion of intra-ocular lens [employer] DIB00 +20.0D into the capsular bag of the Right Eye  Attending surgeon: Rudy Jew. Aljean Horiuchi, MD, MA  Anesthesia: MAC, Topical Akten  Complications: None  Estimated Blood Loss: <17mL (minimal)  Specimens: None  Implants: As above  Indications:  Visually significant age-related cataract, Right Eye  Procedure:  The patient was seen and identified in the pre-operative area. The operative eye was identified and dilated.  The operative eye was marked.  Topical anesthesia was administered to the operative eye.     The patient was then to the operative suite and placed in the supine position.  A timeout was performed confirming the patient, procedure to be performed, and all other relevant information.   The patient's face was prepped and draped in the usual fashion for intra-ocular surgery.  A lid speculum was placed into the operative eye and the surgical microscope moved into place and focused.  A superotemporal paracentesis was created using a 20 gauge paracentesis blade.  Shugarcaine was injected into the anterior chamber.  Viscoelastic was injected into the anterior chamber.  A temporal clear-corneal main wound incision was created using a 2.71mm microkeratome.  A continuous curvilinear capsulorrhexis was initiated using an irrigating cystitome and completed using capsulorrhexis forceps.  Hydrodissection and hydrodeliniation were performed.  Viscoelastic was injected into the anterior chamber.  A phacoemulsification handpiece and a chopper as a second instrument were used to remove the nucleus and epinucleus. The irrigation/aspiration handpiece was used to  remove any remaining cortical material.   The capsular bag was reinflated with viscoelastic, checked, and found to be intact.  The intraocular lens was inserted into the capsular bag.  The irrigation/aspiration handpiece was used to remove any remaining viscoelastic.  The clear corneal wound and paracentesis wounds were then hydrated and checked with Weck-Cels to be watertight. 0.67mL of Moxfloxacin was injected into the anterior chamber. The lid-speculum was removed.  The drape was removed.  The patient's face was cleaned with a wet and dry 4x4. A clear shield was taped over the eye. The patient was taken to the post-operative care unit in good condition, having tolerated the procedure well.  Post-Op Instructions: The patient will follow up at [facility] for a same day post-operative evaluation and will receive all other orders and instructions.

## 2024-01-03 NOTE — Addendum Note (Signed)
 Addendum  created 01/03/24 1133 by Ronelle Nigh, MD   Attestation recorded in [place], Intraprocedure Attestations deleted, Intraprocedure Attestations filed

## 2024-01-03 NOTE — Anesthesia Preprocedure Evaluation (Addendum)
 Anesthesia Evaluation  Patient identified by MRN, date of birth, ID band Patient awake    Reviewed: Allergy & Precautions, H&P , NPO status , Patient's Chart, lab work & pertinent test results, reviewed documented beta blocker date and time   Airway Mallampati: II  TM Distance: >3 FB Neck ROM: full    Dental  (+) Upper Dentures, Lower Dentures,    Pulmonary former smoker   Pulmonary exam normal breath sounds clear to auscultation       Cardiovascular Exercise Tolerance: Good hypertension, + CAD and + Peripheral Vascular Disease  Normal cardiovascular exam Rhythm:regular Rate:Normal     Neuro/Psych TIA Neuromuscular disease  negative psych ROS   GI/Hepatic Neg liver ROS,GERD  ,,  Endo/Other  diabetes, Well Controlled, Type 2Hypothyroidism  Hashimotos disease  Renal/GU Renal InsufficiencyRenal diseaseMild renal disease with normal GFR.  History of 3a CKD  negative genitourinary   Musculoskeletal  (+) Arthritis , Osteoarthritis,  Fibromyalgia -  Abdominal   Peds  Hematology negative hematology ROS (+)   Anesthesia Other Findings   Reproductive/Obstetrics negative OB ROS                             Anesthesia Physical Anesthesia Plan  ASA: 3  Anesthesia Plan: MAC   Post-op Pain Management: Minimal or no pain anticipated   Induction:   PONV Risk Score and Plan: Midazolam  Airway Management Planned: Nasal Cannula and Natural Airway  Additional Equipment: None  Intra-op Plan:   Post-operative Plan:   Informed Consent: I have reviewed the patients History and Physical, chart, labs and discussed the procedure including the risks, benefits and alternatives for the proposed anesthesia with the patient or authorized representative who has indicated his/her understanding and acceptance.     Dental Advisory Given  Plan Discussed with: CRNA  Anesthesia Plan Comments:          Anesthesia Quick Evaluation

## 2024-01-03 NOTE — Discharge Instructions (Signed)
 Please discharge patient when stable, will follow up today with Dr. June Leap at the [facility] office immediately following discharge.  Leave shield in place until visit.  All paperwork with discharge instructions will be given at the office.  [facility] Address:  [address]  [place], [place] [address]

## 2024-01-03 NOTE — Interval H&P Note (Signed)
 History and Physical Interval Note:  01/03/2024 8:53 AM  Brenda Erickson  has presented today for surgery, with the diagnosis of combined forms age related cataract, right eye.  The various methods of treatment have been discussed with the patient and family. After consideration of risks, benefits and other options for treatment, the patient has consented to  Procedure(s): CATARACT EXTRACTION PHACO AND INTRAOCULAR LENS PLACEMENT (IOC) (Right) as a surgical intervention.  The patient's history has been reviewed, patient examined, no change in status, stable for surgery.  I have reviewed the patient's chart and labs.  Questions were answered to the patient's satisfaction.     Fabio Pierce

## 2024-01-04 ENCOUNTER — Encounter (HOSPITAL_COMMUNITY): Payer: Self-pay | Admitting: Ophthalmology

## 2024-02-29 ENCOUNTER — Other Ambulatory Visit: Payer: Self-pay | Admitting: Neurology

## 2024-02-29 NOTE — Telephone Encounter (Signed)
 Last seen on 01/05/23 No follow up scheduled

## 2024-03-25 ENCOUNTER — Other Ambulatory Visit: Payer: Self-pay | Admitting: Neurology

## 2024-03-30 DIAGNOSIS — I2510 Atherosclerotic heart disease of native coronary artery without angina pectoris: Secondary | ICD-10-CM | POA: Diagnosis not present

## 2024-03-30 DIAGNOSIS — R809 Proteinuria, unspecified: Secondary | ICD-10-CM | POA: Diagnosis not present

## 2024-03-30 DIAGNOSIS — J310 Chronic rhinitis: Secondary | ICD-10-CM | POA: Diagnosis not present

## 2024-03-30 DIAGNOSIS — I739 Peripheral vascular disease, unspecified: Secondary | ICD-10-CM | POA: Diagnosis not present

## 2024-03-30 DIAGNOSIS — I10 Essential (primary) hypertension: Secondary | ICD-10-CM | POA: Diagnosis not present

## 2024-03-30 DIAGNOSIS — E782 Mixed hyperlipidemia: Secondary | ICD-10-CM | POA: Diagnosis not present

## 2024-03-30 DIAGNOSIS — E1165 Type 2 diabetes mellitus with hyperglycemia: Secondary | ICD-10-CM | POA: Diagnosis not present

## 2024-03-30 DIAGNOSIS — F5101 Primary insomnia: Secondary | ICD-10-CM | POA: Diagnosis not present

## 2024-03-30 DIAGNOSIS — E039 Hypothyroidism, unspecified: Secondary | ICD-10-CM | POA: Diagnosis not present

## 2024-03-30 DIAGNOSIS — R7401 Elevation of levels of liver transaminase levels: Secondary | ICD-10-CM | POA: Diagnosis not present

## 2024-03-30 DIAGNOSIS — N1831 Chronic kidney disease, stage 3a: Secondary | ICD-10-CM | POA: Diagnosis not present

## 2024-03-30 DIAGNOSIS — M797 Fibromyalgia: Secondary | ICD-10-CM | POA: Diagnosis not present

## 2024-03-31 DIAGNOSIS — M545 Low back pain, unspecified: Secondary | ICD-10-CM | POA: Diagnosis not present

## 2024-04-10 DIAGNOSIS — M545 Low back pain, unspecified: Secondary | ICD-10-CM | POA: Diagnosis not present

## 2024-05-09 DIAGNOSIS — M545 Low back pain, unspecified: Secondary | ICD-10-CM | POA: Diagnosis not present

## 2024-06-09 ENCOUNTER — Other Ambulatory Visit: Payer: Self-pay

## 2024-06-09 DIAGNOSIS — M545 Low back pain, unspecified: Secondary | ICD-10-CM | POA: Diagnosis not present

## 2024-09-04 ENCOUNTER — Telehealth: Payer: Self-pay | Admitting: Neurology

## 2024-09-04 NOTE — Telephone Encounter (Signed)
 Pt's daugter has called to report that pt has not had memantine  (NAMENDA ) 10 MG tablet since April.  She reports pt has rapidly declined as a result of not having this medication, not remembering daughters and other family members, not properly eating.  [name] has scheduled a f/u with Dr Vear and is on wait list as well.  She states she is trying to get pt's  donepezil  (ARICEPT ) 5 MG tablet filled thru PCP.  She'd like to know if refills can be done on memantine  (NAMENDA ) 10 MG tablet , please call.

## 2024-09-04 NOTE — Telephone Encounter (Signed)
 Pt last seen 01/05/23. Next appt scheduled for 11/30/24.   Phone room: please call back and let them know to request refills from PCP until she can be seen for updated visit here. Since it has been over a year since seen here, we cannot refill until she has an updated visit.

## 2024-09-05 NOTE — Telephone Encounter (Signed)
 Noted

## 2024-11-21 ENCOUNTER — Telehealth: Payer: Self-pay | Admitting: Neurology

## 2024-11-21 NOTE — Telephone Encounter (Signed)
 LVM with EC informing pt 1/22 appt needs to be rescheduled. MD is out

## 2024-11-30 ENCOUNTER — Ambulatory Visit: Admitting: Neurology
